# Patient Record
Sex: Male | Born: 1935
Health system: Southern US, Academic
[De-identification: ages and names within clinical notes are randomized; demographics above are authoritative.]

## PROBLEM LIST (undated history)

## (undated) ENCOUNTER — Ambulatory Visit: Payer: MEDICARE

## (undated) ENCOUNTER — Telehealth

## (undated) ENCOUNTER — Telehealth: Attending: Medical Oncology | Primary: Medical Oncology

## (undated) ENCOUNTER — Encounter

## (undated) ENCOUNTER — Ambulatory Visit

## (undated) ENCOUNTER — Ambulatory Visit: Attending: Medical Oncology | Primary: Medical Oncology

## (undated) ENCOUNTER — Encounter: Attending: Family | Primary: Family

## (undated) ENCOUNTER — Encounter: Attending: Pediatrics | Primary: Pediatrics

## (undated) ENCOUNTER — Encounter: Attending: Medical Oncology | Primary: Medical Oncology

## (undated) ENCOUNTER — Inpatient Hospital Stay

## (undated) ENCOUNTER — Ambulatory Visit: Payer: MEDICARE | Attending: "Endocrinology | Primary: "Endocrinology

## (undated) ENCOUNTER — Ambulatory Visit: Payer: MEDICARE | Attending: Medical Oncology | Primary: Medical Oncology

## (undated) ENCOUNTER — Encounter: Attending: Nurse Practitioner | Primary: Nurse Practitioner

## (undated) ENCOUNTER — Ambulatory Visit: Payer: MEDICARE | Attending: Family | Primary: Family

## (undated) ENCOUNTER — Telehealth: Attending: Family | Primary: Family

## (undated) ENCOUNTER — Encounter
Attending: Pharmacist Clinician (PhC)/ Clinical Pharmacy Specialist | Primary: Pharmacist Clinician (PhC)/ Clinical Pharmacy Specialist

## (undated) ENCOUNTER — Telehealth: Attending: Pediatrics | Primary: Pediatrics

## (undated) ENCOUNTER — Ambulatory Visit: Attending: Family Medicine | Primary: Family Medicine

## (undated) ENCOUNTER — Telehealth
Attending: Student in an Organized Health Care Education/Training Program | Primary: Student in an Organized Health Care Education/Training Program

## (undated) ENCOUNTER — Ambulatory Visit
Attending: Student in an Organized Health Care Education/Training Program | Primary: Student in an Organized Health Care Education/Training Program

## (undated) ENCOUNTER — Ambulatory Visit: Attending: Family | Primary: Family

## (undated) ENCOUNTER — Ambulatory Visit
Attending: Pharmacist Clinician (PhC)/ Clinical Pharmacy Specialist | Primary: Pharmacist Clinician (PhC)/ Clinical Pharmacy Specialist

## (undated) ENCOUNTER — Ambulatory Visit: Payer: MEDICARE | Attending: Children | Primary: Children

## (undated) DIAGNOSIS — E079 Disorder of thyroid, unspecified: Secondary | ICD-10-CM

## (undated) DIAGNOSIS — C61 Malignant neoplasm of prostate: Secondary | ICD-10-CM

## (undated) HISTORY — DX: Disorder of thyroid, unspecified: E07.9

## (undated) HISTORY — DX: Malignant neoplasm of prostate: C61

## (undated) MED ORDER — ALENDRONATE 70 MG TABLET: 70 mg | tablet | 0 refills | 357 days

## (undated) MED ORDER — LEUPROLIDE 22.5 MG (3 MONTH) INTRAMUSCULAR SYRINGE KIT: INTRAMUSCULAR | 0 days

## (undated) MED ORDER — CIPROFLOXACIN 500 MG TABLET: Freq: Two times a day (BID) | ORAL | 0 days | Status: SS

---

## 2003-09-23 HISTORY — PX: COLONOSCOPY: SHX174

## 2009-06-28 ENCOUNTER — Ambulatory Visit: Payer: Self-pay | Admitting: Urology

## 2009-07-04 ENCOUNTER — Ambulatory Visit: Payer: Self-pay | Admitting: Urology

## 2009-09-22 HISTORY — PX: APPENDECTOMY: SHX54

## 2009-10-23 ENCOUNTER — Ambulatory Visit: Payer: Self-pay | Admitting: Oncology

## 2009-10-24 ENCOUNTER — Ambulatory Visit: Payer: Self-pay | Admitting: Radiation Oncology

## 2009-11-20 ENCOUNTER — Ambulatory Visit: Payer: Self-pay | Admitting: Oncology

## 2009-11-20 ENCOUNTER — Ambulatory Visit: Payer: Self-pay | Admitting: Radiation Oncology

## 2009-12-21 ENCOUNTER — Ambulatory Visit: Payer: Self-pay | Admitting: Radiation Oncology

## 2009-12-21 ENCOUNTER — Ambulatory Visit: Payer: Self-pay | Admitting: Oncology

## 2010-01-20 ENCOUNTER — Ambulatory Visit: Payer: Self-pay | Admitting: Oncology

## 2010-01-20 ENCOUNTER — Ambulatory Visit: Payer: Self-pay | Admitting: Radiation Oncology

## 2010-02-20 ENCOUNTER — Ambulatory Visit: Payer: Self-pay | Admitting: Radiation Oncology

## 2010-03-22 ENCOUNTER — Ambulatory Visit: Payer: Self-pay | Admitting: Radiation Oncology

## 2010-04-29 ENCOUNTER — Ambulatory Visit: Payer: Self-pay | Admitting: Internal Medicine

## 2012-08-27 DIAGNOSIS — C61 Malignant neoplasm of prostate: Secondary | ICD-10-CM | POA: Insufficient documentation

## 2012-08-27 DIAGNOSIS — N401 Enlarged prostate with lower urinary tract symptoms: Secondary | ICD-10-CM | POA: Insufficient documentation

## 2012-11-10 ENCOUNTER — Ambulatory Visit: Payer: Self-pay | Admitting: Urology

## 2012-11-11 LAB — URINE CULTURE

## 2012-11-17 ENCOUNTER — Ambulatory Visit: Payer: Self-pay | Admitting: Urology

## 2012-11-29 LAB — PATHOLOGY REPORT

## 2013-10-11 ENCOUNTER — Ambulatory Visit: Payer: Self-pay | Admitting: Urology

## 2013-12-06 ENCOUNTER — Ambulatory Visit: Payer: Self-pay | Admitting: Radiation Oncology

## 2013-12-09 ENCOUNTER — Ambulatory Visit: Payer: Self-pay | Admitting: Radiation Oncology

## 2013-12-21 ENCOUNTER — Ambulatory Visit: Payer: Self-pay | Admitting: Radiation Oncology

## 2013-12-21 LAB — HEPATIC FUNCTION PANEL A (ARMC)
ALK PHOS: 63 U/L
Albumin: 3.7 g/dL (ref 3.4–5.0)
Bilirubin, Direct: 0.1 mg/dL (ref 0.00–0.20)
Bilirubin,Total: 0.3 mg/dL (ref 0.2–1.0)
SGOT(AST): 15 U/L (ref 15–37)
SGPT (ALT): 22 U/L (ref 12–78)
Total Protein: 6.9 g/dL (ref 6.4–8.2)

## 2013-12-21 LAB — CBC CANCER CENTER
Basophil #: 0.1 x10 3/mm (ref 0.0–0.1)
Basophil %: 1 %
Eosinophil #: 0.1 x10 3/mm (ref 0.0–0.7)
Eosinophil %: 2 %
HCT: 45.8 % (ref 40.0–52.0)
HGB: 15.2 g/dL (ref 13.0–18.0)
Lymphocyte #: 2.6 x10 3/mm (ref 1.0–3.6)
Lymphocyte %: 36.8 %
MCH: 32.4 pg (ref 26.0–34.0)
MCHC: 33.2 g/dL (ref 32.0–36.0)
MCV: 98 fL (ref 80–100)
Monocyte #: 0.5 x10 3/mm (ref 0.2–1.0)
Monocyte %: 7.5 %
NEUTROS ABS: 3.7 x10 3/mm (ref 1.4–6.5)
NEUTROS PCT: 52.7 %
PLATELETS: 287 x10 3/mm (ref 150–440)
RBC: 4.68 10*6/uL (ref 4.40–5.90)
RDW: 14.1 % (ref 11.5–14.5)
WBC: 7 x10 3/mm (ref 3.8–10.6)

## 2013-12-21 LAB — CREATININE, SERUM
Creatinine: 1.2 mg/dL (ref 0.60–1.30)
EGFR (African American): >60
EGFR (Non-African Amer.): 58 — ABNORMAL LOW

## 2014-01-19 ENCOUNTER — Ambulatory Visit: Payer: Self-pay

## 2014-01-20 ENCOUNTER — Ambulatory Visit: Payer: Self-pay | Admitting: Radiation Oncology

## 2014-06-15 ENCOUNTER — Ambulatory Visit: Payer: Self-pay | Admitting: Internal Medicine

## 2014-06-22 ENCOUNTER — Ambulatory Visit: Payer: Self-pay | Admitting: Internal Medicine

## 2014-12-13 ENCOUNTER — Ambulatory Visit
Admit: 2014-12-13 | Disposition: A | Discharge status: Home / Self Care | Payer: Self-pay | Attending: Internal Medicine | Admitting: Internal Medicine

## 2015-01-12 NOTE — Op Note (Signed)
PATIENT NAME:  Kevin Donovan, Kevin Donovan MR#:  [id number] DATE OF BIRTH:  04/09/1936  DATE OF PROCEDURE:  11/17/2012  PREOPERATIVE DIAGNOSES: Prostate cancer with a rising PSA.   POSTOPERATIVE DIAGNOSIS: Prostate cancer with a rising PSA.    PROCEDURES: 1.  Transperineal prostate biopsy (saturation).  2.  Transrectal ultrasound of the prostate.  3.  Ultrasound guidance for prostate biopsy.   SURGEON: Fed Giovanni, M.D.   ASSISTANT: None.   ANESTHESIA: General.   INDICATION: This is a 79 year old male with a history of T1C adenocarcinoma of the prostate with a Gleason score of 3 + 3, 3 + 4 and 4 + 3. He received adjuvant hormonal therapy and  IMRT. Prostate volume at the time of biopsy was 260 mL. He had slowly rising PSA and repeat biopsy performed on April 2013. It was remarkable for a PSA of 2.2 with prostate volume of approximately 200 mL. Biopsies showed radiation change but no cancer. He was started on Avodart. PSA in November 2013 was 4.1 and in December was 8.1. He presents for saturation biopsies.   DESCRIPTION OF PROCEDURE: The patient was taken to the operating room where general anesthetic was administered. He was placed in the low lithotomy position and his external genitalia and perineum were prepped and draped in the usual fashion. Timeout was performed. A transrectal ultrasound probe was lubricated and passed per rectum. The probe was placed into the stepping unit and the prostatic image was positioned on the ultrasound monitor. A perineal template was then placed into the stepping unit. Transperineal needle biopsies are then performed under ultrasound guidance. A total of 34 biopsies were taken including anterior prostate tissue. No significant bleeding was noted. Prostate volume was calculated at 150 mL. Ultrasound probe was removed. He was taken to PAC-U in stable condition. There were no complications.   ESTIMATED BLOOD LOSS:  Minimal.     ____________________________ Ronda Fairly.  Bernardo Heater, MD scs:cc D: 11/17/2012 15:22:38 ET T: 11/17/2012 15:33:09 ET JOB#: [id number]  cc: Nicki Reaper C. Bernardo Heater, MD, <Dictator> Abbie Sons MD ELECTRONICALLY SIGNED 12/01/2012 7:44

## 2015-01-13 NOTE — Consult Note (Signed)
Reason for Visit: This 79 year old Male patient presents to the clinic for initial evaluation of  recurrent prostate cancer .   Referred by self-referral.  Diagnosis:  Chief Complaint/Diagnosis   35 sutural male treated back in 2001 with IM RT radiation therapy for Gleason 7 (4+3) adenocarcinoma prostate and now with pelvic lymph node recurrence on Lupron therapy  Imaging Report MRI scan reviewed   Referral Report clinical notes reviewed   Planned Treatment Regimen referral to medical oncology plus bone scan   HPI   patient is a 78 rolled male well known to our department have been treated back in August of 2011 4 adenocarcinoma prostate mostly confined to the left lateral lobe Gleason score of 7 (4+3) N. Gleason 6 (3+3). He initially was treated with Lupron. Bone scan initially was worrisome for metastatic disease a low MRI of the spine showed no evidence to suggest bony involvement. MRI of the pelvis at presentation also showed no evidence to suggest extracapsular spread or pelvic lymph nodes. In April 2013 his PSA climbed to 2.2 he's been followed by Dr.Stioff. His PSA in January 2015 had climbed to 13.3 prostate MRI showed lymphadenopathy up to 3 cm and left obturator iliac and periaortic lymph nodes consistent with metastatic disease. He's had saturation repeat biopsy of his prostate which were all benign. He is fairly asymptomatic except for occasional urge incontinencedoes have nocturia times x3 and some urgency and frequency. No diarrhea. He is seen today on his own self referral for consideration of treatment recommendations. He has not had a bone scan performed.atient is having no bone pain at this time.  Past Hx:    Prostate Cancer:    denies:    Hernia Repair:   Past, Family and Social History:  Past Medical History positive   Genitourinary BPH, recurrent UTIs   Past Surgical History herniorrhaphy repair   Family History noncontributory   Social History positive    Social History Comments greater than 40-pack-year smoking history no EtOH abuse history   Additional Past Medical and Surgical History accompanied by his wife today   Allergies:   Valium: Other  Home Meds:  Home Medications: Medication Instructions Status  Avodart 0.5 mg oral capsule 1 cap(s) orally once a day  (Mon. Wed. Fri) Active  Centrum Silver Men's Therapeutic Multiple Vitamins with Minerals oral tablet 1 tab(s) orally once a day Active  Bayer Low Dose 81 mg oral delayed release tablet 1 tab(s) orally once a day Active   Review of Systems:  General negative   Performance Status (ECOG) 0   Skin negative   Breast negative   Ophthalmologic negative   Respiratory and Thorax negative   Cardiovascular negative   Gastrointestinal negative   Genitourinary see HPI   Musculoskeletal negative   Neurological negative   Psychiatric negative   Hematology/Lymphatics negative   Endocrine negative   Allergic/Immunologic negative   Review of Systems   review of systems obtained from nurses notes  Nursing Notes:  Nursing Vital Signs and Chemo Nursing Nursing Notes: *CC Vital Signs Flowsheet:   20-Mar-15 11:07  Pulse Pulse 60  Respirations Respirations 20  SBP SBP 176  DBP DBP 76  Pain Scale (0-10)  0  Current Weight (kg) (kg) 86.7   Physical Exam:  General/Skin/HEENT:  General normal   Skin normal   Eyes normal   ENMT normal   Head and Neck normal   Additional PE well-developed male in NAD. Lungs are clear to A&P cardiac examination  shows regular rate and rhythm. On rectal exam rectal sphincter tone is good prostate is markedly reduced in size without evidence of nodularity or mass. No other rectal abnormality is identified.   Breasts/Resp/CV/GI/GU:  Respiratory and Thorax normal   Cardiovascular normal   Gastrointestinal normal   Genitourinary normal   MS/Neuro/Psych/Lymph:  Musculoskeletal normal   Neurological normal   Lymphatics  normal   Other Results:  Radiology Results: LabUnknown:    20-Jan-15 15:14, MRI Prostate WWO (iCAD)  PACS Image    Relevent Results:   Relevant Scans and Labs MRI scan reviewed   Assessment and Plan: Impression:   recurrent prostate cancer in 79 year old male status post IM RT radiation therapy 4 years prior Plan:   at this time I believe the patient does have metastatic disease to his pelvic lymph nodes with rising PSA and MRI evidence of lymphadenopathy. I've ordered a bone scan for baseline study at this time. Also a referral to medical oncology service in the future he may need chemotherapy treatment depending on the acceleration of his PSA and overall course of his disease. I discussed the case withDr Ma Hillock and have made a referral. I have asked to see him back in 6 months for followup. We have to reevaluate at any time should palliative treatment indicated. See no role at this time for radiation therapy with para-aortic and metastatic pelvic lymphadenopathy. I also agree with the current management by Dr.Stioff ith long-term Lupron suppression.  CC Referral:  cc: Dr. Dorian Pod, Dr. Elenor Quinones   Electronic Signatures: Baruch Gouty, Roda Shutters (MD)  (Signed 20-Mar-15 11:54)  Authored: HPI, Diagnosis, Past Hx, PFSH, Allergies, Home Meds, ROS, Nursing Notes, Physical Exam, Other Results, Relevent Results, Encounter Assessment and Plan, CC Referring Physician   Last Updated: 20-Mar-15 11:54 by Armstead Peaks (MD)

## 2015-02-26 ENCOUNTER — Telehealth: Payer: Self-pay | Admitting: Emergency Medicine

## 2015-02-26 MED ORDER — VALACYCLOVIR HCL 1 G PO TABS: 1000.0000 mg | ORAL_TABLET | Freq: Two times a day (BID) | ORAL | Status: DC

## 2015-02-26 NOTE — Addendum Note (Signed)
Addended by: Ashok Norris on: 02/26/2015 01:57 PM   Modules accepted: Orders

## 2015-02-26 NOTE — Telephone Encounter (Signed)
Patient wife called and stated patient has shingles on chest and arms. Can something be called in.  [facility]

## 2015-11-05 DIAGNOSIS — C61 Malignant neoplasm of prostate: Secondary | ICD-10-CM | POA: Diagnosis not present

## 2015-11-22 DIAGNOSIS — R69 Illness, unspecified: Secondary | ICD-10-CM | POA: Diagnosis not present

## 2015-11-22 DIAGNOSIS — R9721 Rising PSA following treatment for malignant neoplasm of prostate: Secondary | ICD-10-CM | POA: Diagnosis not present

## 2015-11-22 DIAGNOSIS — Z79818 Long term (current) use of other agents affecting estrogen receptors and estrogen levels: Secondary | ICD-10-CM | POA: Diagnosis not present

## 2015-11-22 DIAGNOSIS — I10 Essential (primary) hypertension: Secondary | ICD-10-CM | POA: Diagnosis not present

## 2015-11-22 DIAGNOSIS — Z79899 Other long term (current) drug therapy: Secondary | ICD-10-CM | POA: Diagnosis not present

## 2015-11-22 DIAGNOSIS — C61 Malignant neoplasm of prostate: Secondary | ICD-10-CM | POA: Diagnosis not present

## 2016-01-18 DIAGNOSIS — R590 Localized enlarged lymph nodes: Secondary | ICD-10-CM | POA: Diagnosis not present

## 2016-01-18 DIAGNOSIS — C61 Malignant neoplasm of prostate: Secondary | ICD-10-CM | POA: Diagnosis not present

## 2016-01-18 DIAGNOSIS — N281 Cyst of kidney, acquired: Secondary | ICD-10-CM | POA: Diagnosis not present

## 2016-01-24 DIAGNOSIS — C61 Malignant neoplasm of prostate: Secondary | ICD-10-CM | POA: Diagnosis not present

## 2016-01-24 DIAGNOSIS — R9721 Rising PSA following treatment for malignant neoplasm of prostate: Secondary | ICD-10-CM | POA: Diagnosis not present

## 2016-01-24 DIAGNOSIS — Z79818 Long term (current) use of other agents affecting estrogen receptors and estrogen levels: Secondary | ICD-10-CM | POA: Diagnosis not present

## 2016-01-24 DIAGNOSIS — C779 Secondary and unspecified malignant neoplasm of lymph node, unspecified: Secondary | ICD-10-CM | POA: Diagnosis not present

## 2016-05-08 DIAGNOSIS — C7982 Secondary malignant neoplasm of genital organs: Secondary | ICD-10-CM | POA: Diagnosis not present

## 2016-05-08 DIAGNOSIS — Z79818 Long term (current) use of other agents affecting estrogen receptors and estrogen levels: Secondary | ICD-10-CM | POA: Diagnosis not present

## 2016-05-08 DIAGNOSIS — Z79899 Other long term (current) drug therapy: Secondary | ICD-10-CM | POA: Diagnosis not present

## 2016-05-08 DIAGNOSIS — Z7982 Long term (current) use of aspirin: Secondary | ICD-10-CM | POA: Diagnosis not present

## 2016-05-08 DIAGNOSIS — C61 Malignant neoplasm of prostate: Secondary | ICD-10-CM | POA: Diagnosis not present

## 2016-07-28 DIAGNOSIS — C61 Malignant neoplasm of prostate: Secondary | ICD-10-CM | POA: Diagnosis not present

## 2016-08-21 DIAGNOSIS — R9721 Rising PSA following treatment for malignant neoplasm of prostate: Secondary | ICD-10-CM | POA: Diagnosis not present

## 2016-08-21 DIAGNOSIS — R351 Nocturia: Secondary | ICD-10-CM | POA: Diagnosis not present

## 2016-08-21 DIAGNOSIS — C61 Malignant neoplasm of prostate: Secondary | ICD-10-CM | POA: Diagnosis not present

## 2016-08-21 DIAGNOSIS — Z7982 Long term (current) use of aspirin: Secondary | ICD-10-CM | POA: Diagnosis not present

## 2016-08-21 DIAGNOSIS — I10 Essential (primary) hypertension: Secondary | ICD-10-CM | POA: Diagnosis not present

## 2016-10-16 DIAGNOSIS — R590 Localized enlarged lymph nodes: Secondary | ICD-10-CM | POA: Diagnosis not present

## 2016-10-16 DIAGNOSIS — C61 Malignant neoplasm of prostate: Secondary | ICD-10-CM | POA: Diagnosis not present

## 2016-10-23 DIAGNOSIS — C775 Secondary and unspecified malignant neoplasm of intrapelvic lymph nodes: Secondary | ICD-10-CM

## 2016-10-23 DIAGNOSIS — Z7982 Long term (current) use of aspirin: Secondary | ICD-10-CM | POA: Diagnosis not present

## 2016-10-23 DIAGNOSIS — C61 Malignant neoplasm of prostate: Secondary | ICD-10-CM | POA: Insufficient documentation

## 2016-10-31 ENCOUNTER — Ambulatory Visit (INDEPENDENT_AMBULATORY_CARE_PROVIDER_SITE_OTHER): Payer: Medicare HMO | Admitting: Family Medicine

## 2016-10-31 VITALS — BP 134/58 | HR 74 | Temp 99.0°F | Resp 16 | Ht 70.0 in | Wt 194.2 lb

## 2016-10-31 DIAGNOSIS — M5136 Other intervertebral disc degeneration, lumbar region: Secondary | ICD-10-CM | POA: Insufficient documentation

## 2016-10-31 DIAGNOSIS — F4321 Adjustment disorder with depressed mood: Secondary | ICD-10-CM

## 2016-10-31 DIAGNOSIS — Z23 Encounter for immunization: Secondary | ICD-10-CM

## 2016-10-31 DIAGNOSIS — Z720 Tobacco use: Secondary | ICD-10-CM

## 2016-10-31 DIAGNOSIS — I700 Atherosclerosis of aorta: Secondary | ICD-10-CM

## 2016-10-31 DIAGNOSIS — R69 Illness, unspecified: Secondary | ICD-10-CM | POA: Diagnosis not present

## 2016-10-31 DIAGNOSIS — I10 Essential (primary) hypertension: Secondary | ICD-10-CM | POA: Diagnosis not present

## 2016-10-31 DIAGNOSIS — R030 Elevated blood-pressure reading, without diagnosis of hypertension: Secondary | ICD-10-CM | POA: Insufficient documentation

## 2016-10-31 MED ORDER — LISINOPRIL 20 MG PO TABS: 20.0000 mg | ORAL_TABLET | Freq: Every day | ORAL | 0 refills | Status: DC

## 2016-10-31 NOTE — Progress Notes (Signed)
Name: Kevin Donovan   MRN: [id number]    DOB: [birth year]   Date:10/31/2016       Progress Note  Subjective  Chief Complaint  Chief Complaint  Patient presents with  . blood pressure    HPI  Elevated blood pressure reading: he is going to start chemotherapy and hematologist states that bp is gon to go up by 10-20 points once he takes the medication, he has been anxious because of the diagnoses of metastatic cancer. BP was over 170 during recent visit to [place]: he was diagnosed in 2008 but this month diagnosed with metastatic cancer  Calcification abdominal aorta: found on CT abdomen, he has metastatic prostate cancer, continue aspirin and we will hold off on statin therapy   Patient Active Problem List   Diagnosis Date Noted  . Elevated blood pressure reading 10/31/2016  . Situational depression 10/31/2016  . Calcification of abdominal aorta (HCC) 10/31/2016  . Degenerative disc disease, lumbar 10/31/2016  . Tobacco abuse 10/31/2016  . Prostate cancer metastatic to intrapelvic lymph node ([place]) 10/23/2016  . Malignant neoplasm of prostate ([place]) 08/27/2012  . Enlarged prostate with lower urinary tract symptoms (LUTS) 08/27/2012    Past Surgical History:  Procedure Laterality Date  . APPENDECTOMY  2011  . COLONOSCOPY  2005    Family History  Problem Relation Age of Onset  . Lupus Mother   . Heart disease Mother     CHF  . Heart disease Father     Social History   Social History  . Marital status: Married    Spouse name: N/A  . Number of children: N/A  . Years of education: N/A   Occupational History  . Not on file.   Social History Main Topics  . Smoking status: Current Every Day Smoker    Packs/day: 0.50    Years: 72.00    Types: Cigarettes  . Smokeless tobacco: Never Used  . Alcohol use No  . Drug use: No  . Sexual activity: Yes   Other Topics Concern  . Not on file   Social History Narrative  . No narrative on file     Current  Outpatient Prescriptions:  .  aspirin EC 81 MG tablet, Take 81 mg by mouth., Disp: , Rfl:  .  escitalopram (LEXAPRO) 10 MG tablet, Take 10 mg by mouth., Disp: , Rfl:  .  vitamin C (ASCORBIC ACID) 250 MG tablet, Take 250 mg by mouth daily., Disp: , Rfl:  .  leuprolide (LUPRON) 22.5 MG injection, Inject 22.5 mg into the muscle., Disp: , Rfl:  .  lisinopril (PRINIVIL,ZESTRIL) 20 MG tablet, Take 1 tablet (20 mg total) by mouth daily., Disp: 30 tablet, Rfl: 0 .  Multiple Vitamin (MULTI-VITAMINS) TABS, Take by mouth., Disp: , Rfl:   Allergies  Allergen Reactions  . Diazepam Other (See Comments)    Hallucinations, altered mental status     ROS  Ten systems reviewed and is negative except as mentioned in HPI   Objective  Vitals:   10/31/16 1535  BP: (!) 134/58  Pulse: 74  Resp: 16  Temp: 99 F (37.2 C)  SpO2: 95%  Weight: 194 lb 3 oz (88.1 kg)  Height: 5\' 10"  (1.778 m)    Body mass index is 27.86 kg/m.  Physical Exam  Constitutional: Patient appears well-developed and well-nourished.  No distress.  HEENT: head atraumatic, normocephalic, pupils equal and reactive to light, neck supple, throat within normal limits Cardiovascular: Normal rate, regular  rhythm and normal heart sounds.  No murmur heard. No BLE edema. Pulmonary/Chest: Effort normal and breath sounds normal. No respiratory distress. Abdominal: Soft.  There is no tenderness. Psychiatric: Patient has a normal mood and affect. behavior is normal. Judgment and thought content normal.   PHQ2/9: Depression screen PHQ 2/9 10/31/2016  Decreased Interest 0  Down, Depressed, Hopeless 0  PHQ - 2 Score 0     Fall Risk: Fall Risk  10/31/2016  Falls in the past year? No    Functional Status Survey: Is the patient deaf or have difficulty hearing?: No Does the patient have difficulty seeing, even when wearing glasses/contacts?: No Does the patient have difficulty concentrating, remembering, or making decisions?: No Does  the patient have difficulty walking or climbing stairs?: No Does the patient have difficulty dressing or bathing?: No Does the patient have difficulty doing errands alone such as visiting a doctor's office or shopping?: No    Assessment & Plan  1. Essential hypertension  - lisinopril (PRINIVIL,ZESTRIL) 20 MG tablet; Take 1 tablet (20 mg total) by mouth daily.  Dispense: 30 tablet; Refill: 0  2. Need for pneumococcal vaccination  - Pneumococcal conjugate vaccine 13-valent  3. Situational depression  Taking medication  4. Calcification of abdominal aorta (HCC)  We will hold off on statin at this time, he is taking aspirin   5. Tobacco abuse  Started smoking at age 35 yo, he denies cough or SOB

## 2016-11-03 ENCOUNTER — Telehealth: Payer: Self-pay | Admitting: Emergency Medicine

## 2016-11-03 DIAGNOSIS — C61 Malignant neoplasm of prostate: Secondary | ICD-10-CM | POA: Diagnosis not present

## 2016-11-03 NOTE — Telephone Encounter (Signed)
Was seen  At [facility] today. BP was elevated at 197/86 when arriving and on recheck in was 166/90. Do they need to increase BP medication.

## 2016-11-03 NOTE — Telephone Encounter (Signed)
How is his bp at home, we can add norvasc 5 mg to bring it down, but it may cause him to get dizzy if bp is low at home

## 2016-11-04 ENCOUNTER — Other Ambulatory Visit: Payer: Self-pay | Admitting: Emergency Medicine

## 2016-11-04 MED ORDER — AMLODIPINE BESYLATE 5 MG PO TABS: 5.0000 mg | ORAL_TABLET | Freq: Every day | ORAL | 1 refills | Status: DC

## 2016-11-04 NOTE — Progress Notes (Unsigned)
norvasc 

## 2016-11-04 NOTE — Telephone Encounter (Signed)
Would like script for 5mg  Norvasc called into [facility]. BP today 160/80.

## 2016-11-05 ENCOUNTER — Other Ambulatory Visit: Payer: Self-pay | Admitting: Family Medicine

## 2016-11-14 ENCOUNTER — Encounter: Payer: Self-pay | Admitting: Family Medicine

## 2016-11-14 DIAGNOSIS — R001 Bradycardia, unspecified: Secondary | ICD-10-CM | POA: Insufficient documentation

## 2016-11-20 DIAGNOSIS — R351 Nocturia: Secondary | ICD-10-CM | POA: Diagnosis not present

## 2016-11-20 DIAGNOSIS — Z7982 Long term (current) use of aspirin: Secondary | ICD-10-CM | POA: Diagnosis not present

## 2016-11-20 DIAGNOSIS — C61 Malignant neoplasm of prostate: Secondary | ICD-10-CM | POA: Diagnosis not present

## 2016-11-20 DIAGNOSIS — Z79818 Long term (current) use of other agents affecting estrogen receptors and estrogen levels: Secondary | ICD-10-CM | POA: Diagnosis not present

## 2016-11-20 DIAGNOSIS — Z888 Allergy status to other drugs, medicaments and biological substances status: Secondary | ICD-10-CM | POA: Diagnosis not present

## 2016-11-20 DIAGNOSIS — R69 Illness, unspecified: Secondary | ICD-10-CM | POA: Diagnosis not present

## 2016-11-20 DIAGNOSIS — C775 Secondary and unspecified malignant neoplasm of intrapelvic lymph nodes: Secondary | ICD-10-CM | POA: Diagnosis not present

## 2016-11-20 DIAGNOSIS — F419 Anxiety disorder, unspecified: Secondary | ICD-10-CM | POA: Diagnosis not present

## 2016-11-20 DIAGNOSIS — Z923 Personal history of irradiation: Secondary | ICD-10-CM | POA: Diagnosis not present

## 2016-11-20 DIAGNOSIS — Z192 Hormone resistant malignancy status: Secondary | ICD-10-CM | POA: Diagnosis not present

## 2016-11-24 ENCOUNTER — Telehealth: Payer: Self-pay | Admitting: Emergency Medicine

## 2016-11-24 NOTE — Telephone Encounter (Signed)
Sarah from [facility]  Nurse navigator for Dr. Arlyss Gandy Oncologist [phone] called and stated that Kevin Donovan came in for the pancreatic cancer study and BP was elevated at 150/68. Judson Roch stated that they needed it to be lower than that. Spoke to his wife [name] she stated BP yesterday was 138/60. His follow up appointment with you is 11/28/2016.

## 2016-11-28 ENCOUNTER — Ambulatory Visit (INDEPENDENT_AMBULATORY_CARE_PROVIDER_SITE_OTHER): Payer: Medicare HMO | Admitting: Family Medicine

## 2016-11-28 ENCOUNTER — Encounter: Payer: Self-pay | Admitting: Family Medicine

## 2016-11-28 VITALS — BP 132/84 | HR 66 | Temp 97.6°F | Resp 18 | Ht 70.0 in | Wt 196.0 lb

## 2016-11-28 DIAGNOSIS — C775 Secondary and unspecified malignant neoplasm of intrapelvic lymph nodes: Secondary | ICD-10-CM

## 2016-11-28 DIAGNOSIS — I440 Atrioventricular block, first degree: Secondary | ICD-10-CM | POA: Diagnosis not present

## 2016-11-28 DIAGNOSIS — R69 Illness, unspecified: Secondary | ICD-10-CM | POA: Diagnosis not present

## 2016-11-28 DIAGNOSIS — C61 Malignant neoplasm of prostate: Secondary | ICD-10-CM

## 2016-11-28 DIAGNOSIS — F4321 Adjustment disorder with depressed mood: Secondary | ICD-10-CM

## 2016-11-28 DIAGNOSIS — I10 Essential (primary) hypertension: Secondary | ICD-10-CM

## 2016-11-28 MED ORDER — AMLODIPINE BESY-BENAZEPRIL HCL 5-20 MG PO CAPS: 1.0000 | ORAL_CAPSULE | Freq: Every day | ORAL | 1 refills | Status: DC

## 2016-11-28 MED ORDER — ESCITALOPRAM OXALATE 10 MG PO TABS: 10.0000 mg | ORAL_TABLET | Freq: Every day | ORAL | 1 refills | Status: DC

## 2016-11-28 NOTE — Progress Notes (Signed)
Name: Kevin Donovan   MRN: [id number]    DOB: 15-Mar-1936   Date:11/28/2016       Progress Note  Subjective  Chief Complaint  Chief Complaint  Patient presents with  . Follow-up    1 month F/U  . Hypertension    Started Lisinopril on last visit and feels tired all the time.  BP has been the same at home around 130's/70's    HPI  HTN: he was going to start a research medication for prostate cancer and bp was elevated during [facility] visit, so he could not start therapy, bp at home has been well controlled with Lotrel 130's/70's. No chest pain or palpitation.   Prostate  Cancer: he was diagnosed in 2008 but this month diagnosed with metastatic cancer, he decided not to pursue the research arm of therapy because it will increase his bp, but will start therapy soon .   Calcification abdominal aorta: found on CT abdomen, he has metastatic prostate cancer, continue aspirin , he does not want to take statins at this time  Depression: he was very snappy, bp was going up, we started Lexapro January and is doing well, mood has been more leveled off, wife is happy about it. He denies crying spells.   Bradycardia on EKG: done at [facility], but no change since 2014.   Patient Active Problem List   Diagnosis Date Noted  . Bradycardia on ECG 11/14/2016  . Elevated blood pressure reading 10/31/2016  . Situational depression 10/31/2016  . Calcification of abdominal aorta (HCC) 10/31/2016  . Degenerative disc disease, lumbar 10/31/2016  . Prostate cancer metastatic to intrapelvic lymph node ([place]) 10/23/2016  . Malignant neoplasm of prostate ([place]) 08/27/2012  . Enlarged prostate with lower urinary tract symptoms (LUTS) 08/27/2012    Past Surgical History:  Procedure Laterality Date  . APPENDECTOMY  2011  . COLONOSCOPY  2005    Family History  Problem Relation Age of Onset  . Lupus Mother   . Heart disease Mother     CHF  . Heart disease Father     Social History   Social History  . Marital  status: Married    Spouse name: N/A  . Number of children: N/A  . Years of education: N/A   Occupational History  . Not on file.   Social History Main Topics  . Smoking status: Current Every Day Smoker    Packs/day: 0.50    Years: 72.00    Types: Cigarettes  . Smokeless tobacco: Never Used  . Alcohol use No  . Drug use: No  . Sexual activity: Yes   Other Topics Concern  . Not on file   Social History Narrative  . No narrative on file     Current Outpatient Prescriptions:  .  aspirin EC 81 MG tablet, Take 81 mg by mouth., Disp: , Rfl:  .  escitalopram (LEXAPRO) 10 MG tablet, Take 1 tablet (10 mg total) by mouth daily., Disp: 30 tablet, Rfl: 1 .  leuprolide (LUPRON) 22.5 MG injection, Inject 22.5 mg into the muscle., Disp: , Rfl:  .  Multiple Vitamin (MULTI-VITAMINS) TABS, Take by mouth., Disp: , Rfl:  .  vitamin C (ASCORBIC ACID) 250 MG tablet, Take 250 mg by mouth daily., Disp: , Rfl:  .  amLODipine-benazepril (LOTREL) 5-20 MG capsule, Take 1 capsule by mouth daily., Disp: 30 capsule, Rfl: 1  Allergies  Allergen Reactions  . Diazepam Other (See Comments)    Hallucinations, altered mental status  ROS  Constitutional: Negative for fever or weight change.  Respiratory: Negative for cough and shortness of breath.   Cardiovascular: Negative for chest pain or palpitations.  Gastrointestinal: Negative for abdominal pain, no bowel changes.  Musculoskeletal: Negative for gait problem or joint swelling.  Skin: Negative for rash.  Neurological: Negative for dizziness or headache.  No other specific complaints in a complete review of systems (except as listed in HPI above).  Objective  Vitals:   11/28/16 1045  BP: 132/84  Pulse: 66  Resp: 18  Temp: 97.6 F (36.4 C)  TempSrc: Oral  SpO2: 94%  Weight: 196 lb (88.9 kg)  Height: 5\' 10"  (1.778 m)    Body mass index is 28.12 kg/m.  Physical Exam  Constitutional: Patient appears well-developed and  well-nourished.  No distress.  HEENT: head atraumatic, normocephalic, pupils equal and reactive to light, ears neck supple, throat within normal limits Cardiovascular: Normal rate, regular rhythm and normal heart sounds.  No murmur heard. No BLE edema. Pulmonary/Chest: Effort normal and breath sounds normal. No respiratory distress. Abdominal: Soft.  There is no tenderness. Psychiatric: Patient has a normal mood and affect. behavior is normal. Judgment and thought content normal.  PHQ2/9: Depression screen PHQ 2/9 10/31/2016  Decreased Interest 0  Down, Depressed, Hopeless 0  PHQ - 2 Score 0     Fall Risk: Fall Risk  10/31/2016  Falls in the past year? No     Assessment & Plan  1. Essential hypertension  bp here is at goal, up at [facility], decided not to be part of the prostate cancer study - amLODipine-benazepril (LOTREL) 5-20 MG capsule; Take 1 capsule by mouth daily.  Dispense: 30 capsule; Refill: 1  2. Situational depression  Doing better, less snappy  - escitalopram (LEXAPRO) 10 MG tablet; Take 1 tablet (10 mg total) by mouth daily.  Dispense: 30 tablet; Refill: 1   3. Prostate cancer metastatic to intrapelvic lymph node ([place])  Continue follow up with [facility], he decided not to be part of the study   4. First degree AV block  Since 2014 , no changes on EKG done from 2014 and 2018 at [facility] - discussed starting statin therapy but he does not like taking medication, but is taking aspirin and bp is at goal

## 2016-12-25 DIAGNOSIS — C61 Malignant neoplasm of prostate: Secondary | ICD-10-CM | POA: Diagnosis not present

## 2016-12-25 DIAGNOSIS — C775 Secondary and unspecified malignant neoplasm of intrapelvic lymph nodes: Secondary | ICD-10-CM | POA: Diagnosis not present

## 2017-01-28 ENCOUNTER — Ambulatory Visit (INDEPENDENT_AMBULATORY_CARE_PROVIDER_SITE_OTHER): Payer: Medicare HMO | Admitting: Family Medicine

## 2017-01-28 ENCOUNTER — Encounter: Payer: Self-pay | Admitting: Family Medicine

## 2017-01-28 VITALS — BP 130/58 | HR 67 | Temp 97.7°F | Resp 16 | Ht 70.0 in | Wt 201.6 lb

## 2017-01-28 DIAGNOSIS — C775 Secondary and unspecified malignant neoplasm of intrapelvic lymph nodes: Secondary | ICD-10-CM

## 2017-01-28 DIAGNOSIS — C61 Malignant neoplasm of prostate: Secondary | ICD-10-CM | POA: Diagnosis not present

## 2017-01-28 DIAGNOSIS — Z720 Tobacco use: Secondary | ICD-10-CM | POA: Diagnosis not present

## 2017-01-28 DIAGNOSIS — I10 Essential (primary) hypertension: Secondary | ICD-10-CM | POA: Diagnosis not present

## 2017-01-28 DIAGNOSIS — I700 Atherosclerosis of aorta: Secondary | ICD-10-CM | POA: Diagnosis not present

## 2017-01-28 DIAGNOSIS — F4321 Adjustment disorder with depressed mood: Secondary | ICD-10-CM

## 2017-01-28 DIAGNOSIS — R69 Illness, unspecified: Secondary | ICD-10-CM | POA: Diagnosis not present

## 2017-01-28 MED ORDER — ESCITALOPRAM OXALATE 10 MG PO TABS: 10.0000 mg | ORAL_TABLET | Freq: Every day | ORAL | 1 refills | Status: DC

## 2017-01-28 MED ORDER — AMLODIPINE BESY-BENAZEPRIL HCL 5-20 MG PO CAPS: 1.0000 | ORAL_CAPSULE | Freq: Every day | ORAL | 1 refills | Status: DC

## 2017-01-28 NOTE — Progress Notes (Signed)
Name: Kevin Donovan   MRN: [id number]    DOB: [birth year]   Date:01/28/2017       Progress Note  Subjective  Chief Complaint  Chief Complaint  Patient presents with  . Hypertension    2 month follow up    HPI  HTN: he was going to start a research medication for prostate cancer and bp was elevated during [facility] visit, so he could not start therapy, but on May 5th he started a new regiment, bp at home has been well controlled with Lotrel 130's/60's No chest pain or palpitation.   Prostate Cancer: he was diagnosed in 2008 but this month diagnosed with metastatic cancer, he decided not to pursue the research arm of therapy because it will increase his bp, but is on Lupron and added prednisone and Zytiga on May 5th, 2018. So far he has been tolerating therapy well. No side effects  Calcification abdominal aorta: found on CT abdomen, he has metastatic prostate cancer, continue aspirin , he does not want to take statins at this time  Depression: he states that since he started on medication ( Lexapro ) he has not noticed any difference in his behavior, however his wife has noticed a great improvement in his mood. He does not want to increase dose at this time.   Bradycardia on EKG: done at [facility], but no change since 2014.   Tobacco: he is down to half  pack daily, instead to 1 pack. Wife quit smoking Mid April 2018. He is trying to quit also.   Patient Active Problem List   Diagnosis Date Noted  . First degree AV block 11/28/2016  . Bradycardia on ECG 11/14/2016  . Elevated blood pressure reading 10/31/2016  . Situational depression 10/31/2016  . Calcification of abdominal aorta (HCC) 10/31/2016  . Degenerative disc disease, lumbar 10/31/2016  . Prostate cancer metastatic to intrapelvic lymph node ([place]) 10/23/2016  . Malignant neoplasm of prostate ([place]) 08/27/2012  . Enlarged prostate with lower urinary tract symptoms (LUTS) 08/27/2012    Past Surgical History:  Procedure  Laterality Date  . APPENDECTOMY  2011  . COLONOSCOPY  2005    Family History  Problem Relation Age of Onset  . Lupus Mother   . Heart disease Mother     CHF  . Heart disease Father     Social History   Social History  . Marital status: Married    Spouse name: N/A  . Number of children: N/A  . Years of education: N/A   Occupational History  . Not on file.   Social History Main Topics  . Smoking status: Current Every Day Smoker    Packs/day: 0.50    Years: 72.00    Types: Cigarettes  . Smokeless tobacco: Never Used  . Alcohol use No  . Drug use: No  . Sexual activity: Yes   Other Topics Concern  . Not on file   Social History Narrative  . No narrative on file     Current Outpatient Prescriptions:  .  abiraterone Acetate (ZYTIGA) 250 MG tablet, Take 1,000 mg by mouth., Disp: , Rfl:  .  predniSONE (DELTASONE) 5 MG tablet, Take 10 mg by mouth., Disp: , Rfl:  .  amLODipine-benazepril (LOTREL) 5-20 MG capsule, Take 1 capsule by mouth daily., Disp: 90 capsule, Rfl: 1 .  aspirin EC 81 MG tablet, Take 81 mg by mouth., Disp: , Rfl:  .  escitalopram (LEXAPRO) 10 MG tablet, Take 1 tablet (10 mg total) by  mouth daily., Disp: 90 tablet, Rfl: 1 .  leuprolide (LUPRON) 22.5 MG injection, Inject 22.5 mg into the muscle., Disp: , Rfl:  .  Multiple Vitamin (MULTI-VITAMINS) TABS, Take by mouth., Disp: , Rfl:  .  vitamin C (ASCORBIC ACID) 250 MG tablet, Take 250 mg by mouth daily., Disp: , Rfl:   Allergies  Allergen Reactions  . Diazepam Other (See Comments)    Hallucinations, altered mental status     ROS  Constitutional: Negative for fever or significant  weight change.  Respiratory: Negative for cough and shortness of breath.   Cardiovascular: Negative for chest pain or palpitations.  Gastrointestinal: Negative for abdominal pain, no bowel changes.  Musculoskeletal: Negative for gait problem or joint swelling.  Skin: Negative for rash.  Neurological: Negative for  dizziness or headache.  No other specific complaints in a complete review of systems (except as listed in HPI above).  Objective  Vitals:   01/28/17 0957  BP: (!) 130/58  Pulse: 67  Resp: 16  Temp: 97.7 F (36.5 C)  SpO2: 97%  Weight: 201 lb 9 oz (91.4 kg)  Height: 5\' 10"  (1.778 m)    Body mass index is 28.92 kg/m.  Physical Exam  Constitutional: Patient appears well-developed and well-nourished. Obese No distress.  HEENT: head atraumatic, normocephalic, pupils equal and reactive to light, neck supple, throat within normal limits Cardiovascular: Normal rate, regular rhythm and normal heart sounds.  No murmur heard. 1 plus  BLE edema. Pulmonary/Chest: Effort normal and breath sounds normal. No respiratory distress. Abdominal: Soft.  There is no tenderness. Psychiatric: Patient has a normal mood and affect. behavior is normal. Judgment and thought content normal.  PHQ2/9: Depression screen PHQ 2/9 10/31/2016  Decreased Interest 0  Down, Depressed, Hopeless 0  PHQ - 2 Score 0     Fall Risk: Fall Risk  10/31/2016  Falls in the past year? No    Assessment & Plan  1. Essential hypertension  - amLODipine-benazepril (LOTREL) 5-20 MG capsule; Take 1 capsule by mouth daily.  Dispense: 90 capsule; Refill: 1 Mild edema, likely from Norvasc but is not bothering him  2. Prostate cancer metastatic to intrapelvic lymph node ([place])  He just started new medication on top of Lupron, also on Zytiga and prednisone  3. Calcification of abdominal aorta (HCC)  On aspirin only, he refuses statin therapy   4. Situational depression  - escitalopram (LEXAPRO) 10 MG tablet; Take 1 tablet (10 mg total) by mouth daily.  Dispense: 90 tablet; Refill: 1  5. Tobacco abuse  He is trying to quit smoking

## 2017-02-06 DIAGNOSIS — C61 Malignant neoplasm of prostate: Secondary | ICD-10-CM | POA: Diagnosis not present

## 2017-02-06 DIAGNOSIS — C775 Secondary and unspecified malignant neoplasm of intrapelvic lymph nodes: Secondary | ICD-10-CM | POA: Diagnosis not present

## 2017-02-26 DIAGNOSIS — C775 Secondary and unspecified malignant neoplasm of intrapelvic lymph nodes: Secondary | ICD-10-CM | POA: Diagnosis not present

## 2017-02-26 DIAGNOSIS — Z79899 Other long term (current) drug therapy: Secondary | ICD-10-CM | POA: Diagnosis not present

## 2017-02-26 DIAGNOSIS — Z5111 Encounter for antineoplastic chemotherapy: Secondary | ICD-10-CM | POA: Diagnosis not present

## 2017-02-26 DIAGNOSIS — F419 Anxiety disorder, unspecified: Secondary | ICD-10-CM | POA: Diagnosis not present

## 2017-02-26 DIAGNOSIS — Z79818 Long term (current) use of other agents affecting estrogen receptors and estrogen levels: Secondary | ICD-10-CM | POA: Diagnosis not present

## 2017-02-26 DIAGNOSIS — Z192 Hormone resistant malignancy status: Secondary | ICD-10-CM | POA: Diagnosis not present

## 2017-02-26 DIAGNOSIS — R69 Illness, unspecified: Secondary | ICD-10-CM | POA: Diagnosis not present

## 2017-02-26 DIAGNOSIS — R9721 Rising PSA following treatment for malignant neoplasm of prostate: Secondary | ICD-10-CM | POA: Diagnosis not present

## 2017-02-26 DIAGNOSIS — C61 Malignant neoplasm of prostate: Secondary | ICD-10-CM | POA: Diagnosis not present

## 2017-02-26 DIAGNOSIS — R351 Nocturia: Secondary | ICD-10-CM | POA: Diagnosis not present

## 2017-04-08 NOTE — Unmapped (Signed)
Specialty Pharmacy Refill Coordination Note     John Watkins is a 81 y.o. male contacted today regarding refills of his specialty medication(s).    Reviewed and verified with patient:      Specialty medication(s) and dose(s) confirmed: yes  Changes to medications: no  Changes to insurance: no    Medication Adherence    Patient reported X missed doses in the last month:  0  Specialty Medication:  Zytiga 250mg   Informant:  patient  Reliability of informant:  reliable  Provider-estimated medication adherence level:  90-100%  Patient is at risk for Non-Adherence:  No  Confirmed plan for next specialty medication refill:  delivery by pharmacy  Medication Assistance Program  Refill Coordination  Has the Patient's Contact Information Changed:  No  Is the Shipping Address Different:  No  Shipping Information  Delivery Scheduled:  Yes  Delivery Date:  04/14/17  Medications to be Shipped:  Zytiga 250mg , Prednisone 5mg           Follow-up: 1 month(s)     Genia Hotter

## 2017-04-13 MED FILL — ZYTIGA/250MG/TAB: ZYTIGA/250MG/TAB | 30 days supply | Qty: 120 | Fill #3

## 2017-04-13 MED FILL — PREDNISONE/5MG/TAB: PREDNISONE/5MG/TAB | 15 days supply | Qty: 30 | Fill #3

## 2017-04-13 NOTE — Unmapped (Signed)
[facility] Specialty Pharmacy Refill Coordination Note  Specialty Medication(s): Zytiga  Additional Medications shipped: prednisone    John Watkins, DOB: Jan 07, 1936  Phone: [phone] (home) , Alternate phone contact: N/A  Phone or address changes today?: No  All above HIPAA information was verified with patient.  Shipping Address: [address]  [place] [address]   Insurance changes? No    Completed refill call assessment today to schedule patient's medication shipment from the [facility] [phone]).      Confirmed the medication and dosage are correct and have not changed: Yes, regimen is correct and unchanged.    Confirmed patient started or stopped the following medications in the past month:  No, there are no changes reported at this time.    Are you tolerating your medication?:  [name] reports tolerating the medication.    ADHERENCE    (Below is required for Medicare Part B or Transplant patients only - per drug):   How many tablets were dispensed last month: 120  Patient currently has 12 tablets of Zytiga remaining.    Did you miss any doses in the past 4 weeks? No missed doses reported.    FINANCIAL/SHIPPING    Delivery Scheduled: Yes, Expected medication delivery date: 04/14/17     [name] did not have any additional questions at this time.    Delivery address validated in FSI scheduling system: Yes, address listed in FSI is correct.    We will follow up with patient monthly for standard refill processing and delivery.      Thank you,  Roderic Palau   [facility] Specialty Pharmacist

## 2017-04-28 ENCOUNTER — Other Ambulatory Visit: Payer: Self-pay | Admitting: Family Medicine

## 2017-04-28 DIAGNOSIS — I10 Essential (primary) hypertension: Secondary | ICD-10-CM

## 2017-05-08 NOTE — Unmapped (Signed)
[facility] Specialty Pharmacy Refill Coordination Note  Specialty Medication(s): Zytiga  Additional Medications shipped: prednisone    Eldwin Volkov, DOB: [birth year]  Phone: [phone] (home) , Alternate phone contact: N/A  Phone or address changes today?: No  All above HIPAA information was verified with patient.  Shipping Address: [address]  [place] [address]   Insurance changes? No    Completed refill call assessment today to schedule patient's medication shipment from the [facility] [phone]).      Confirmed the medication and dosage are correct and have not changed: Yes, regimen is correct and unchanged.    Confirmed patient started or stopped the following medications in the past month:  No, there are no changes reported at this time.    Are you tolerating your medication?:  Henryk reports tolerating the medication.    ADHERENCE    Did you miss any doses in the past 4 weeks? No missed doses reported.    FINANCIAL/SHIPPING    Delivery Scheduled: Yes, Expected medication delivery date: Tues, Aug 21     Peggy did not have any additional questions at this time.    Delivery address validated in FSI scheduling system: Yes, address listed in FSI is correct.    We will follow up with patient monthly for standard refill processing and delivery.      Thank you,  Tawanna Solo Shared [facility] Specialty Pharmacist

## 2017-05-11 MED FILL — PREDNISONE/5MG/TAB: PREDNISONE/5MG/TAB | 30 days supply | Qty: 60 | Fill #0

## 2017-05-11 MED FILL — ZYTIGA/250MG/TAB: ZYTIGA/250MG/TAB | 30 days supply | Qty: 120 | Fill #4

## 2017-05-28 ENCOUNTER — Ambulatory Visit
Admission: RE | Admit: 2017-05-28 | Discharge: 2017-05-28 | Disposition: A | Discharge status: Home / Self Care | Payer: MEDICARE | Attending: Medical Oncology | Admitting: Medical Oncology

## 2017-05-28 ENCOUNTER — Ambulatory Visit
Admission: RE | Admit: 2017-05-28 | Discharge: 2017-05-28 | Disposition: A | Discharge status: Home / Self Care | Payer: MEDICARE

## 2017-05-28 DIAGNOSIS — R232 Flushing: Secondary | ICD-10-CM

## 2017-05-28 DIAGNOSIS — C61 Malignant neoplasm of prostate: Principal | ICD-10-CM

## 2017-05-28 DIAGNOSIS — C775 Secondary and unspecified malignant neoplasm of intrapelvic lymph nodes: Secondary | ICD-10-CM

## 2017-05-28 DIAGNOSIS — R5383 Other fatigue: Secondary | ICD-10-CM

## 2017-05-28 DIAGNOSIS — R351 Nocturia: Secondary | ICD-10-CM

## 2017-05-28 DIAGNOSIS — C7951 Secondary malignant neoplasm of bone: Secondary | ICD-10-CM

## 2017-05-28 DIAGNOSIS — Z5112 Encounter for antineoplastic immunotherapy: Secondary | ICD-10-CM | POA: Diagnosis not present

## 2017-05-28 DIAGNOSIS — R69 Illness, unspecified: Secondary | ICD-10-CM | POA: Diagnosis not present

## 2017-05-28 DIAGNOSIS — Z192 Hormone resistant malignancy status: Secondary | ICD-10-CM | POA: Diagnosis not present

## 2017-05-28 DIAGNOSIS — R9721 Rising PSA following treatment for malignant neoplasm of prostate: Secondary | ICD-10-CM | POA: Diagnosis not present

## 2017-05-28 LAB — COMPREHENSIVE METABOLIC PANEL
ALBUMIN: 3.5 g/dL (ref 3.5–5.0)
ALKALINE PHOSPHATASE: 62 U/L (ref 38–126)
ALT (SGPT): 25 U/L (ref 19–72)
ANION GAP: 8 mmol/L — ABNORMAL LOW (ref 9–15)
AST (SGOT): 14 U/L — ABNORMAL LOW (ref 19–55)
BILIRUBIN TOTAL: 0.6 mg/dL (ref 0.0–1.2)
BLOOD UREA NITROGEN: 9 mg/dL (ref 7–21)
BUN / CREAT RATIO: 12
CALCIUM: 9.2 mg/dL (ref 8.5–10.2)
CREATININE: 0.76 mg/dL (ref 0.70–1.30)
EGFR MDRD AF AMER: >=60 mL/min/{1.73_m2} (ref >=60)
EGFR MDRD NON AF AMER: >=60 mL/min/{1.73_m2} (ref >=60)
GLUCOSE RANDOM: 102 mg/dL (ref 65–179)
POTASSIUM: 3.5 mmol/L (ref 3.5–5.0)
PROTEIN TOTAL: 6.1 g/dL — ABNORMAL LOW (ref 6.5–8.3)
SODIUM: 142 mmol/L (ref 135–145)

## 2017-05-28 LAB — TESTOSTERONE TOTAL: Testosterone:MCnc:Pt:Ser/Plas:Qn:: 28 — ABNORMAL LOW

## 2017-05-28 LAB — BILIRUBIN TOTAL: Bilirubin:MCnc:Pt:Ser/Plas:Qn:: 0.6

## 2017-05-28 LAB — PROSTATE SPECIFIC ANTIGEN: Prostate specific Ag:MCnc:Pt:Ser/Plas:Qn:: 4.08 — ABNORMAL HIGH

## 2017-05-28 NOTE — Unmapped (Signed)
Lab drawn and sent for analysis.

## 2017-05-28 NOTE — Unmapped (Addendum)
Clinical Pharmacist Practitioner: [facility]    Oral Chemotherapy Follow-Up    Assessment and recommendations:  1. Abiraterone monitoring and side effect management- doing well on abiraterone he has minor fatigue that does not interfere with his daily activities. He remains active but feels sluggish. LFTs WNL, and blood pressure well controlled.  - Continue abiraterone 1000 mg daily and prednisone 10 mg daily.     2. Hypokalemia - resolved since last check on 6/7 without intervention.    Follow- up: Return to clinic in 3 months for follow up    ______________________________________________________________________    Oral Chemotherapy: Abiraterone 1000 mg daily and prednisone 10 mg daily  Start date: 01/2017    HPI: Mr. 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    Interim History: Mr. Citro is doing well. He has no real complaints, he does have some fatigue that does not interfere with his activities. He continues to garden, mow the lawn ride the tractor, just feels more sluggish. He is not interested in started any medications for his fatigue but will let us know if it continues to worsen and is unable to do his honey-do-list.    Adherence: Takes 4 tablets first thing in the morning and waits at least an hour before eating anything. He takes his prednisone with dinner since that is the only meal he eats and prefers to take with food, but does not complain of insomnia. He reports he has not missed any doses.     Toxicities: Mild Fatigue    Drug-Drug Interactions: none and no new medications    Medications:  Current Outpatient Prescriptions   Medication Sig Dispense Refill   ??? abiraterone (ZYTIGA) 250 mg Tab tablet Take 4 tablets (1,000 mg total) by mouth daily. 120 tablet 6   ??? amLODIPine-benazepril (LOTREL 5-20) 5-20 mg per capsule Take 1 capsule by mouth daily.     ??? ascorbic acid, vitamin C, (VITAMIN C) 250 MG tablet Take 250 mg by mouth.     ??? aspirin (ECOTRIN) 81 MG tablet Take 81 mg by mouth.     ??? escitalopram oxalate (LEXAPRO) 10 MG tablet Take 1 tablet (10 mg total) by mouth daily. 90 tablet 1   ??? leuprolide (LUPRON) 22.5 mg injection Inject 22.5 mg into the muscle Every three (3) months.     ??? multivitamin (THERAGRAN) per tablet Take 1 tablet by mouth daily.     ??? predniSONE (DELTASONE) 5 MG tablet Take 2 tablets (10 mg total) by mouth daily. 60 tablet 6     No current facility-administered medications for this visit.        I spent 10 minutes with Mr.Boydstun in direct patient care.     Laverna Peace PharmD, BCOP, CPP  Hematology/Oncology Pharmacist  P: [phone]

## 2017-05-28 NOTE — Unmapped (Signed)
Addended by: Nita Sickle E on: 05/28/2017 11:21 AM     Modules accepted: Orders

## 2017-05-28 NOTE — Unmapped (Signed)
Medical Oncology (Prostate Cancer)    Referring physician:  Riki Altes, MD [facility])  Other physician:  Irven Easterly, MD    CC: Prostate cancer to lymph nodes and possibly bone s/p IMRT to prostate, with elevated PSA, on Lupron    Current therapy: Lupron    HPI:  2010: IMRT to prostate for T1c prostate adenocarcinoma, biopsy with up to Gleason 4+3=7.  Subsequent PSA rise with negative saturation biopsy on 11/17/12.  - 04/05/13: 8.3  - 08/29/13: 12.8  - 02/09/14: 13.3  10/2013: MRI with enlarged left obturator, iliac and para-aortic nodes up to 3 cm consistent with metastatic disease. Bilateral ischia and L pelvis with bony areas <54mm of unclear etiology (bone islands vs. metastases).  10/2013: Lupron started  Subsequent PSA:  - 05/01/14: 1.4  - 08/02/14: 1.4  - 11/03/14: 1.7  01/18/16: Bone scan: no metastases.  01/18/16: CT A/P: 2.6 cm left external iliac chain hyperenhancing node (series 6, image 57). Several prominent subcentimeter lymph nodes are seen along the left para-aortic retroperitoneum.  Prostate is enlarged measuring 6.0 x 6.7 x 9.3 cm. [Possible lipoma in gluteus maximus, or could be related to Lupron injection, discussed w urology, no action warranted.]  10/16/16: restaging:   - CT CAP: Interval increase in size of left external and common iliac chain as well as periaortic lymph nodes, worrisome for metastatic disease.   - Bone Scan: No evidence of osseous metastatic disease.  5/18: Abiraterone/prednisone    ROS: Nocturia: every 2-3 hours but drinks tea before bed, doesn't bother him.  No pain.  Fatigue.  Good appetite.  Has tolerated Lupron well.  Remainder of 10 system ROS negative.    PE  There were no vitals taken for this visit.  NAD  A+Ox3  No rash  No LEE  No cords  Neuro non focal  CTA  RRR  Abd NT ND    Current Outpatient Prescriptions   Medication Sig Dispense Refill   ??? abiraterone (ZYTIGA) 250 mg Tab tablet Take 4 tablets (1,000 mg total) by mouth daily. 120 tablet 6   ??? amLODIPine-benazepril (LOTREL 5-20) 5-20 mg per capsule Take 1 capsule by mouth daily.     ??? ascorbic acid, vitamin C, (VITAMIN C) 250 MG tablet Take 250 mg by mouth.     ??? aspirin (ECOTRIN) 81 MG tablet Take 81 mg by mouth.     ??? escitalopram oxalate (LEXAPRO) 10 MG tablet Take 1 tablet (10 mg total) by mouth daily. 90 tablet 1   ??? leuprolide (LUPRON) 22.5 mg injection Inject 22.5 mg into the muscle Every three (3) months.     ??? multivitamin (THERAGRAN) per tablet Take 1 tablet by mouth daily.     ??? predniSONE (DELTASONE) 5 MG tablet Take 2 tablets (10 mg total) by mouth daily. 60 tablet 6     No current facility-administered medications for this visit.      Allergies   Allergen Reactions   ??? Valium [Diazepam] Other (See Comments)     Hallucinations, altered mental status           Appointment on 05/28/2017   Component Date Value Ref Range Status   ??? 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  I previously asked Radiation Oncology to weigh in about potential RT to the lymph nodes, and they felt this would not be beneficial and would confer toxicity without likely long-term disease control.  He has been asymptomatic.  He has had RT to the prostate in the past.      Plan:  - Continue Lupron every three months (05/28/17).  - Continue abiraterone/prednisone.  - Fatigue on abiraterone - he does not want to try ritalin currently but might consider in the future.  - Follow PSA.  - Follow LFTs - today pending.  - Blood pressure control with his PCP.  - Nocturia: persistent, does not want Flomax.   - Anxiety: On Lexapro - Pharmacy ran interactions for both olaparib and abiraterone.  - Smoking: He understands risks, and the [place] smoking cessation program here has been meeting with him for counseling.  - Bone density was WNL.  Follow up for Lupron and monitoring on abiraterone.

## 2017-06-03 NOTE — Unmapped (Signed)
[facility] Specialty Pharmacy Refill Coordination Note  Specialty Medication(s): ZYTIGA  Additional Medications shipped: PREDNISONE    John Watkins, DOB: [birth year]  Phone: [phone] (home) , Alternate phone contact: N/A  Phone or address changes today?: No  All above HIPAA information was verified with patient.  Shipping Address: [address]  [place] [address]   Insurance changes? No    Completed refill call assessment today to schedule patient's medication shipment from the [facility] [phone]).      Confirmed the medication and dosage are correct and have not changed: Yes, regimen is correct and unchanged.    Confirmed patient started or stopped the following medications in the past month:  No, there are no changes reported at this time.    Are you tolerating your medication?:  John Watkins reports tolerating the medication.    ADHERENCE    Did you miss any doses in the past 4 weeks? No missed doses reported.    FINANCIAL/SHIPPING    Delivery Scheduled: Yes, Expected medication delivery date: 9/18     John Watkins did not have any additional questions at this time.    Delivery address validated in FSI scheduling system: Yes, address listed in FSI is correct.    We will follow up with patient monthly for standard refill processing and delivery.      Thank you,  Courtney Paris   [facility] Shared [facility] Specialty Pharmacist

## 2017-06-17 NOTE — Unmapped (Signed)
[employer] has denied mfg assistance for Zytiga. Denial reason: Patient is above income guidelines.

## 2017-06-18 MED FILL — ZYTIGA/250MG/TAB: ZYTIGA/250MG/TAB | 30 days supply | Qty: 120 | Fill #5

## 2017-06-18 MED FILL — PREDNISONE/5MG/TAB: PREDNISONE/5MG/TAB | 30 days supply | Qty: 60 | Fill #1

## 2017-06-18 NOTE — Unmapped (Signed)
Specialty Pharmacy Refill Coordination Note     John Watkins is a 81 y.o. male contacted today regarding refills of his specialty medication(s).    Reviewed and verified with patient:      Specialty medication(s) and dose(s) confirmed: yes  Changes to medications: no  Changes to insurance: no    Medication Adherence    Patient reported X missed doses in the last month:  0  Specialty Medication:  ZYTIGA 250 MG QOH 4   Patient is on additional specialty medications:  No  Demonstrates understanding of importance of adherence:  yes  Informant:  patient  Support network for adherence:  family member  Confirmed plan for next specialty medication refill:  delivery by pharmacy  Refills needed for supportive medications:  not needed       Medication Assistance Program    Medication Program Assistance Provided(If Applicable):  [employer] FUNDS DEPLETED        Refill Coordination    Has the Patients' Contact Information Changed:  No  Is the Shipping Address Different:  No       Shipping Information    Delivery Scheduled:  Yes  Delivery Date:  06/19/17  Medications to be Shipped:  ZYTIGA   PREDNISONE         FOR SOME REASON, PROBABLY HURRICANE RELATED, THE ORIGINAL REFILL FROM THE [facility] NEVER WENT OUT. PATIENT CALLED TODAY, SENDING OUT TODAY. PATIENT NOT UPSET, NOR HAS HE MISSED ANY DOSES.   Follow-up: 21 day(s)     Ledora Bottcher

## 2017-06-25 NOTE — Unmapped (Signed)
Contacted John Watkins, 81 y.o. by phone to provide 45-month follow-up for tobacco use/dependence. SW offered to re-enroll pt in services. Pt declined.     06/25/17 1440   Tobacco Use Treatment   Program [facility]   Type of Visit Scheduling   Tobacco Use Treatment Visit Talked with patient   Goals Of Session (1 session total)   Cancer Center Patients   Primary Cancer Type Urological   Tobacco Use Assessment (Past 30 days)   Time Since Last Tobacco Use smoked a cigarette today (at least one puff)   Type of Tobacco Products Used Cigarettes   Quantity Used 10   Quantity Per day   TTS Information   Diagnosis Tobacco use disorder, unspecified, uncomplicated (F17.200)   Interventions Assessed   TTS Visit Length 3-10 minutes   Follow-up Data 6 month FU     Pt seen by Norma Fredrickson, Social Work Intern  [facility]  MSW Candidate '20, [employer]  ??  Intern supervised and note reviewed by Ignacia Palma, MSW, LCSW, LCAS  Clinical Social Worker / Tobacco Treatment Specialist  Tobacco Treatment Program  [facility]  Phone: [phone]  Pager: [phone]

## 2017-07-07 ENCOUNTER — Encounter: Payer: Self-pay | Admitting: Family Medicine

## 2017-07-07 ENCOUNTER — Ambulatory Visit (INDEPENDENT_AMBULATORY_CARE_PROVIDER_SITE_OTHER): Payer: Medicare HMO | Admitting: Family Medicine

## 2017-07-07 VITALS — BP 120/80 | HR 80 | Temp 99.3°F | Resp 14 | Ht 70.0 in | Wt 187.3 lb

## 2017-07-07 DIAGNOSIS — J0100 Acute maxillary sinusitis, unspecified: Secondary | ICD-10-CM | POA: Diagnosis not present

## 2017-07-07 DIAGNOSIS — I700 Atherosclerosis of aorta: Secondary | ICD-10-CM | POA: Diagnosis not present

## 2017-07-07 DIAGNOSIS — R55 Syncope and collapse: Secondary | ICD-10-CM

## 2017-07-07 DIAGNOSIS — H938X2 Other specified disorders of left ear: Secondary | ICD-10-CM

## 2017-07-07 LAB — LIPID PANEL
CHOL/HDL RATIO: 3.1 (calc) (ref <5.0)
Cholesterol: 136 mg/dL (ref <200)
HDL: 44 mg/dL (ref >40)
LDL CHOLESTEROL (CALC): 74 mg/dL
NON-HDL CHOLESTEROL (CALC): 92 mg/dL (ref <130)
TRIGLYCERIDES: 95 mg/dL (ref <150)

## 2017-07-07 LAB — COMPLETE METABOLIC PANEL WITH GFR
AG RATIO: 1.4 (calc) (ref 1.0–2.5)
ALBUMIN MSPROF: 3.8 g/dL (ref 3.6–5.1)
ALT: 12 U/L (ref 9–46)
AST: 11 U/L (ref 10–35)
Alkaline phosphatase (APISO): 59 U/L (ref 40–115)
BUN: 12 mg/dL (ref 7–25)
CHLORIDE: 104 mmol/L (ref 98–110)
CO2: 27 mmol/L (ref 20–32)
Calcium: 9 mg/dL (ref 8.6–10.3)
Creat: 0.82 mg/dL (ref 0.70–1.18)
GFR, EST AFRICAN AMERICAN: 97 mL/min/{1.73_m2} (ref >=60)
GFR, Est Non African American: 84 mL/min/{1.73_m2} (ref >=60)
GLOBULIN: 2.7 g/dL (ref 1.9–3.7)
GLUCOSE: 96 mg/dL (ref 65–99)
POTASSIUM: 3.3 mmol/L — AB (ref 3.5–5.3)
SODIUM: 140 mmol/L (ref 135–146)
TOTAL PROTEIN: 6.5 g/dL (ref 6.1–8.1)
Total Bilirubin: 0.4 mg/dL (ref 0.2–1.2)

## 2017-07-07 LAB — CBC
HCT: 36.9 % — ABNORMAL LOW (ref 38.5–50.0)
Hemoglobin: 12.8 g/dL — ABNORMAL LOW (ref 13.2–17.1)
MCH: 32.7 pg (ref 27.0–33.0)
MCHC: 34.7 g/dL (ref 32.0–36.0)
MCV: 94.1 fL (ref 80.0–100.0)
MPV: 10.2 fL (ref 7.5–12.5)
Platelets: 361 10*3/uL (ref 140–400)
RBC: 3.92 10*6/uL — ABNORMAL LOW (ref 4.20–5.80)
RDW: 13.3 % (ref 11.0–15.0)
WBC: 13.1 10*3/uL — ABNORMAL HIGH (ref 3.8–10.8)

## 2017-07-07 MED ORDER — FLUTICASONE PROPIONATE 50 MCG/ACT NA SUSP: 2.0000 | Freq: Every day | NASAL | 0 refills | Status: DC

## 2017-07-07 MED ORDER — AMOXICILLIN-POT CLAVULANATE 875-125 MG PO TABS: 1.0000 | ORAL_TABLET | Freq: Two times a day (BID) | ORAL | 0 refills | Status: DC

## 2017-07-07 NOTE — Progress Notes (Signed)
Name: Kevin Donovan   MRN: [id number]    DOB: 1936-01-23   Date:07/07/2017       Progress Note  Subjective  Chief Complaint  Chief Complaint  Patient presents with  . Ear Fullness  . Cough    HPI  Pre-syncope: he had one episode a couple of months ago at home, he took a hot shower and while shaving he felt dizzy/room went bright. He sat down on the floor and within minutes symptoms resolved. A second episode happened while on vacation 6 days ago. They were in [place], he skipped lunch, and they had finished driving 5 hours in the rain, he felt dizzy again ( no spinning), the room went bright, he felt hot, wife noticed he did not look right so they placed him on the ground. Symptoms resolved once he sat down on the floor.  He is getting treated for prostate cancer. He has noticed some intermittent dizziness when he squats and gets up suddenly. He has mild daily headache when he started bp medication months ago.Denies bowel or bladder incontinence. No loss of memory.   Sinusitis: he noticed symptoms of post-nasal drainage, that makes him have a productive cough. Symptoms are getting progressively worse, states that over the past week noticed matted eyes in am's, right ear pain and hearing loss. No fever at home, has occasional chills, appetite is normal.   Abnormal aorta atherosclerosis: found on CT abdomen and pelvis. He prefers not taking statin therapy, but willing to get labs and re-evaluate, continue bp control and also aspirin daily   Patient Active Problem List   Diagnosis Date Noted  . Hot flashes 05/28/2017  . Fatigue 05/28/2017  . First degree AV block 11/28/2016  . Bradycardia on ECG 11/14/2016  . Elevated blood pressure reading 10/31/2016  . Situational depression 10/31/2016  . Calcification of abdominal aorta (HCC) 10/31/2016  . Degenerative disc disease, lumbar 10/31/2016  . Prostate cancer metastatic to intrapelvic lymph node ([place]) 10/23/2016  . Malignant neoplasm  of prostate ([place]) 08/27/2012  . Enlarged prostate with lower urinary tract symptoms (LUTS) 08/27/2012    Past Surgical History:  Procedure Laterality Date  . APPENDECTOMY  2011  . COLONOSCOPY  2005    Family History  Problem Relation Age of Onset  . Lupus Mother   . Heart disease Mother        CHF  . Heart disease Father     Social History   Social History  . Marital status: Married    Spouse name: N/A  . Number of children: N/A  . Years of education: N/A   Occupational History  . Not on file.   Social History Main Topics  . Smoking status: Current Every Day Smoker    Packs/day: 0.50    Years: 72.00    Types: Cigarettes  . Smokeless tobacco: Never Used  . Alcohol use No  . Drug use: No  . Sexual activity: Yes   Other Topics Concern  . Not on file   Social History Narrative  . No narrative on file     Current Outpatient Prescriptions:  .  abiraterone Acetate (ZYTIGA) 250 MG tablet, Take 1,000 mg by mouth., Disp: , Rfl:  .  amLODipine-benazepril (LOTREL) 5-20 MG capsule, TAKE 1 CAPSULE BY MOUTH ONCE DAILY, Disp: 90 capsule, Rfl: 0 .  aspirin EC 81 MG tablet, Take 81 mg by mouth., Disp: , Rfl:  .  escitalopram (LEXAPRO) 10 MG tablet, Take 1 tablet (10 mg total) by  mouth daily., Disp: 90 tablet, Rfl: 1 .  leuprolide (LUPRON) 22.5 MG injection, Inject 22.5 mg into the muscle., Disp: , Rfl:  .  Multiple Vitamin (MULTI-VITAMINS) TABS, Take by mouth., Disp: , Rfl:  .  predniSONE (DELTASONE) 5 MG tablet, Take 10 mg by mouth., Disp: , Rfl:  .  vitamin C (ASCORBIC ACID) 250 MG tablet, Take 250 mg by mouth daily., Disp: , Rfl:   Allergies  Allergen Reactions  . Diazepam Other (See Comments)    Hallucinations, altered mental status     ROS  Constitutional: Negative for fever or weight change.  Respiratory: Negative for cough and shortness of breath.   Cardiovascular: Negative for chest pain or palpitations.  Gastrointestinal: Negative for abdominal pain, no  bowel changes.  Musculoskeletal: positiive  for gait problem ( he states since radiation treatment back in 2010 has weakness on legs when walks and has to stop to rest)  or joint swelling.  Skin: Negative for rash.  Neurological: positive for orthostatic changes,  positive for daily  Headache going on for the past 4 months - since started therapy.  Two episodes of pre-syncope No other specific complaints in a complete review of systems (except as listed in HPI above).  Objective  Vitals:   07/07/17 1333  BP: 120/80  Pulse: 80  Resp: 14  Temp: 99.3 F (37.4 C)  TempSrc: Oral  SpO2: 97%  Weight: 187 lb 4.8 oz (85 kg)  Height: 5\' 10"  (1.778 m)    Body mass index is 26.87 kg/m.  Physical Exam  Constitutional: Patient appears well-developed and well-nourished.  No distress.  HEENT: head atraumatic, normocephalic, pupils equal and reactive to light, ears mild wax both ear canals,  neck supple, posterior pharynx, - post-nasal drainage ( white), mild drainage ( yellow from both eyes), injected conjunctiva, tender palpation to right maxillary sinus Cardiovascular: Normal rate, regular rhythm and normal heart sounds.  No murmur heard. 1 plus BLE edema. Pulmonary/Chest: Effort normal and breath sounds normal. No respiratory distress. Abdominal: Soft.  There is no tenderness. Psychiatric: Patient has a normal mood and affect. behavior is normal. Judgment and thought content normal.  PHQ2/9: Depression screen PHQ 2/9 10/31/2016  Decreased Interest 0  Down, Depressed, Hopeless 0  PHQ - 2 Score 0    Fall Risk: Fall Risk  07/07/2017 10/31/2016  Falls in the past year? No No    Assessment & Plan  1. Acute non-recurrent maxillary sinusitis  - amoxicillin-clavulanate (AUGMENTIN) 875-125 MG tablet; Take 1 tablet by mouth 2 (two) times daily.  Dispense: 20 tablet; Refill: 0  2. Pre-syncope  - COMPLETE METABOLIC PANEL WITH GFR - CBC Discussed referral to cardiologist if he continues to  have pre-syncopal episodes. Advised to have regular meals, also needs to get up slowly, and stay hydrated.  He has prostate cancer and may need CT brain, but he would like to hold off for now  3. Ear fullness, left  - fluticasone (FLONASE) 50 MCG/ACT nasal spray; Place 2 sprays into both nostrils daily.  Dispense: 16 g; Refill: 0  4. Calcification of abdominal aorta (HCC)  - Lipid panel   He is not on statin therapy , but he is taking aspirin daily, we will check labs. He states he does not like taking medications

## 2017-07-09 ENCOUNTER — Ambulatory Visit: Payer: Medicare HMO | Admitting: Family Medicine

## 2017-07-09 NOTE — Unmapped (Signed)
[facility] Refill Coordination Note  Specialty Medication(s): Zytiga  Additional Medications shipped: prednisone    John Watkins, DOB: [birth year]  Phone: [phone] (home) , Alternate phone contact: N/A  Phone or address changes today?: No  All above HIPAA information was verified with patient's family member.  Shipping Address: [address]  [place] [address]   Insurance changes? No    Completed refill call assessment today to schedule patient's medication shipment from the [facility] [phone]).      Confirmed the medication and dosage are correct and have not changed: Yes, regimen is correct and unchanged.    Confirmed patient started or stopped the following medications in the past month:  Yes. Dason reports starting the following medications: amoxicillin - short course for sinus infection    Are you tolerating your medication?:  Leibish reports tolerating the medication.    ADHERENCE    Did you miss any doses in the past 4 weeks? No missed doses reported.    FINANCIAL/SHIPPING    Delivery Scheduled: Yes, Expected medication delivery date: Thurs, Oct 25     Jamarcus did not have any additional questions at this time.    Delivery address validated in FSI scheduling system: Yes, address listed in FSI is correct.    We will follow up with patient monthly for standard refill processing and delivery.      Thank you,  Tawanna Solo Shared [facility] Specialty Pharmacist

## 2017-07-14 MED FILL — ZYTIGA/250MG/TAB: ZYTIGA/250MG/TAB | 30 days supply | Qty: 120 | Fill #6

## 2017-07-14 MED FILL — PREDNISONE/5MG/TABS: PREDNISONE/5MG/TABS | 30 days supply | Qty: 60 | Fill #2

## 2017-07-22 ENCOUNTER — Telehealth: Payer: Self-pay | Admitting: Family Medicine

## 2017-07-22 NOTE — Telephone Encounter (Signed)
Requesting another round of antibiotics. Wife Hildred Alamin) states pt is doing much better but feels that one more round would clear it up. Uses Tar heel drug

## 2017-07-23 ENCOUNTER — Other Ambulatory Visit: Payer: Self-pay | Admitting: Family Medicine

## 2017-07-23 DIAGNOSIS — I10 Essential (primary) hypertension: Secondary | ICD-10-CM

## 2017-07-23 NOTE — Telephone Encounter (Signed)
He needs to come in 

## 2017-07-23 NOTE — Telephone Encounter (Signed)
Mrs. Kevin Donovan (his wife) was informed and declined appt. They will keep next week appt

## 2017-07-23 NOTE — Telephone Encounter (Signed)
Refill request for Hypertension medication:  Lotrel  Last office visit pertaining to hypertension: 07/07/17  BP Readings from Last 3 Encounters:  07/07/17 120/80  01/28/17 (!) 130/58  11/28/16 132/84    Lab Results  Component Value Date   CREATININE 0.82 07/07/2017   BUN 12 07/07/2017   NA 140 07/07/2017   K 3.3 (L) 07/07/2017   CL 104 07/07/2017   CO2 27 07/07/2017    Follow up visit: 07/31/2017

## 2017-07-31 ENCOUNTER — Ambulatory Visit: Payer: Medicare HMO | Admitting: Family Medicine

## 2017-07-31 ENCOUNTER — Encounter: Payer: Self-pay | Admitting: Family Medicine

## 2017-07-31 VITALS — BP 124/76 | HR 74 | Temp 98.6°F | Resp 18 | Ht 70.0 in | Wt 188.3 lb

## 2017-07-31 DIAGNOSIS — C775 Secondary and unspecified malignant neoplasm of intrapelvic lymph nodes: Secondary | ICD-10-CM | POA: Diagnosis not present

## 2017-07-31 DIAGNOSIS — Z720 Tobacco use: Secondary | ICD-10-CM | POA: Diagnosis not present

## 2017-07-31 DIAGNOSIS — I739 Peripheral vascular disease, unspecified: Secondary | ICD-10-CM

## 2017-07-31 DIAGNOSIS — H938X2 Other specified disorders of left ear: Secondary | ICD-10-CM

## 2017-07-31 DIAGNOSIS — F4321 Adjustment disorder with depressed mood: Secondary | ICD-10-CM | POA: Diagnosis not present

## 2017-07-31 DIAGNOSIS — I10 Essential (primary) hypertension: Secondary | ICD-10-CM | POA: Diagnosis not present

## 2017-07-31 DIAGNOSIS — D692 Other nonthrombocytopenic purpura: Secondary | ICD-10-CM

## 2017-07-31 DIAGNOSIS — R69 Illness, unspecified: Secondary | ICD-10-CM | POA: Diagnosis not present

## 2017-07-31 DIAGNOSIS — C61 Malignant neoplasm of prostate: Secondary | ICD-10-CM

## 2017-07-31 DIAGNOSIS — I700 Atherosclerosis of aorta: Secondary | ICD-10-CM | POA: Diagnosis not present

## 2017-07-31 MED ORDER — ESCITALOPRAM OXALATE 10 MG PO TABS: 10.0000 mg | ORAL_TABLET | Freq: Every day | ORAL | 1 refills | Status: DC

## 2017-07-31 MED ORDER — AMLODIPINE BESY-BENAZEPRIL HCL 5-20 MG PO CAPS: 1.0000 | ORAL_CAPSULE | Freq: Every day | ORAL | 1 refills | Status: DC

## 2017-07-31 NOTE — Progress Notes (Signed)
Name: Kevin Donovan   MRN: [id number]    DOB: 1936-05-07   Date:07/31/2017       Progress Note  Subjective  Chief Complaint  Chief Complaint  Patient presents with  . Medication Refill    6 month F/U  . Hypertension  . Depression  . Prostate Cancer    HPI  HTN: he was going to start a research medication for prostate cancer and bp was elevated during [facility] visit, so he could not start therapy, but on May 5th he started a new regiment, bp at home has been well controlled with Lotrel 120's/70's  No chest pain or palpitation. Dizziness resolved after sinusitis treatment, eustachian tube dysfunction resolved with flonase   Prostate Cancer: he was diagnosed in 2008 but this month diagnosed with metastatic cancer, he decided not to pursue the research arm of therapy because it will increase his bp, but is on Lupron and added prednisone and Zytiga on May 5th, 2018. So far he has been tolerating therapy well. No side effects   Calcification abdominal aorta: found on CT abdomen, he has metastatic prostate cancer, continue aspirin , he does not want to take statins at this time  Depression: he states that since he started on medication ( Lexapro ) he has not noticed any difference in his behavior, however his wife has noticed a great improvement in his mood. He does not want to increase dose at this time. Unchanged   Bradycardia on EKG: done at [facility], but no change since 2014.   Tobacco: he is down to half  pack daily, instead to 1 pack. Wife is down to 5 cigarettes daily. He gave up on quitting smoking.   Claudication: he states he needs to stop after he walks for a prolonged period of time, he states it depends on the surface and how fast he is walking, discussed neurogenic versus vascular problems, he is not interested in having any studies done for it Patient Active Problem List   Diagnosis Date Noted  . Hot flashes 05/28/2017  . Fatigue 05/28/2017  . First degree AV block 11/28/2016   . Bradycardia on ECG 11/14/2016  . Situational depression 10/31/2016  . Calcification of abdominal aorta (HCC) 10/31/2016  . Degenerative disc disease, lumbar 10/31/2016  . Prostate cancer metastatic to intrapelvic lymph node ([place]) 10/23/2016  . Malignant neoplasm of prostate ([place]) 08/27/2012  . Enlarged prostate with lower urinary tract symptoms (LUTS) 08/27/2012    Past Surgical History:  Procedure Laterality Date  . APPENDECTOMY  2011  . COLONOSCOPY  2005    Family History  Problem Relation Age of Onset  . Lupus Mother   . Heart disease Mother        CHF  . Heart disease Father     Social History   Socioeconomic History  . Marital status: Married    Spouse name: Not on file  . Number of children: Not on file  . Years of education: Not on file  . Highest education level: Not on file  Social Needs  . Financial resource strain: Not on file  . Food insecurity - worry: Not on file  . Food insecurity - inability: Not on file  . Transportation needs - medical: Not on file  . Transportation needs - non-medical: Not on file  Occupational History  . Not on file  Tobacco Use  . Smoking status: Current Every Day Smoker    Packs/day: 0.50    Years: 72.00    Pack  years: 36.00    Types: Cigarettes  . Smokeless tobacco: Never Used  Substance and Sexual Activity  . Alcohol use: No  . Drug use: No  . Sexual activity: Yes  Other Topics Concern  . Not on file  Social History Narrative  . Not on file     Current Outpatient Medications:  .  abiraterone Acetate (ZYTIGA) 250 MG tablet, Take 1,000 mg by mouth., Disp: , Rfl:  .  amLODipine-benazepril (LOTREL) 5-20 MG capsule, TAKE 1 CAPSULE BY MOUTH ONCE DAILY, Disp: 90 capsule, Rfl: 1 .  aspirin EC 81 MG tablet, Take 81 mg by mouth., Disp: , Rfl:  .  escitalopram (LEXAPRO) 10 MG tablet, Take 1 tablet (10 mg total) by mouth daily., Disp: 90 tablet, Rfl: 1 .  leuprolide (LUPRON) 22.5 MG injection, Inject 22.5 mg every 3  (three) months into the muscle. , Disp: , Rfl:  .  Multiple Vitamin (MULTI-VITAMINS) TABS, Take by mouth., Disp: , Rfl:  .  predniSONE (DELTASONE) 5 MG tablet, Take 10 mg daily with breakfast by mouth. , Disp: , Rfl:  .  vitamin C (ASCORBIC ACID) 250 MG tablet, Take 250 mg by mouth daily., Disp: , Rfl:  .  fluticasone (FLONASE) 50 MCG/ACT nasal spray, Place 2 sprays into both nostrils daily., Disp: 16 g, Rfl: 0  Allergies  Allergen Reactions  . Diazepam Other (See Comments)    Hallucinations, altered mental status     ROS  Constitutional: Negative for fever or weight change.  Respiratory: Positive  for cough but denies  shortness of breath.   Cardiovascular: Negative for chest pain or palpitations.  Gastrointestinal: Negative for abdominal pain, no bowel changes.  Musculoskeletal: Negative for gait problem or joint swelling.  Skin: Negative for rash.  Neurological: Negative for dizziness or headache.  No other specific complaints in a complete review of systems (except as listed in HPI above).  Objective  Vitals:   07/31/17 1013  BP: 124/76  Pulse: 74  Resp: 18  Temp: 98.6 F (37 C)  TempSrc: Oral  SpO2: 96%  Weight: 188 lb 4.8 oz (85.4 kg)  Height: 5\' 10"  (1.778 m)    Body mass index is 27.02 kg/m.  Physical Exam  Constitutional: Patient appears well-developed and well-nourished. Overweight  No distress.  HEENT: head atraumatic, normocephalic, pupils equal and reactive to light, neck supple, throat within normal limits Cardiovascular: Normal rate, regular rhythm and normal heart sounds.  No murmur heard. No BLE edema. Pulmonary/Chest: Effort normal and breath sounds normal. No respiratory distress. Abdominal: Soft.  There is no tenderness. Psychiatric: Patient has a normal mood and affect. behavior is normal. Judgment and thought content normal.  Recent Results (from the past 2160 hour(s))  Lipid panel     Status: None   Collection Time: 07/07/17  2:31 PM   Result Value Ref Range   Cholesterol 136 <200 mg/dL   HDL 44 >40 mg/dL   Triglycerides 95 <150 mg/dL   LDL Cholesterol (Calc) 74 mg/dL (calc)    Comment: Reference range: <100 . Desirable range <100 mg/dL for primary prevention;   <70 mg/dL for patients with CHD or diabetic patients  with > or = 2 CHD risk factors. Marland Kitchen LDL-C is now calculated using the Martin-Hopkins  calculation, which is a validated novel method providing  better accuracy than the Friedewald equation in the  estimation of LDL-C.  Cresenciano Genre et al. Annamaria Helling. 9811;914(78): 2061-2068  (http://education.[employer].com/faq/FAQ164)    Total CHOL/HDL Ratio 3.1 <5.0 (calc)  Non-HDL Cholesterol (Calc) 92 <130 mg/dL (calc)    Comment: For patients with diabetes plus 1 major ASCVD risk  factor, treating to a non-HDL-C goal of <100 mg/dL  (LDL-C of <70 mg/dL) is considered a therapeutic  option.   COMPLETE METABOLIC PANEL WITH GFR     Status: Abnormal   Collection Time: 07/07/17  2:33 PM  Result Value Ref Range   Glucose, Bld 96 65 - 99 mg/dL    Comment: .            Fasting reference interval .    BUN 12 7 - 25 mg/dL   Creat 0.82 0.70 - 1.18 mg/dL    Comment: For patients >39 years of age, the reference limit for Creatinine is approximately 13% higher for people identified as African-American. .    GFR, Est Non African American 84 > OR = 60 mL/min/1.86m2   GFR, Est African American 97 > OR = 60 mL/min/1.54m2   BUN/Creatinine Ratio NOT APPLICABLE 6 - 22 (calc)   Sodium 140 135 - 146 mmol/L   Potassium 3.3 (L) 3.5 - 5.3 mmol/L   Chloride 104 98 - 110 mmol/L   CO2 27 20 - 32 mmol/L   Calcium 9.0 8.6 - 10.3 mg/dL   Total Protein 6.5 6.1 - 8.1 g/dL   Albumin 3.8 3.6 - 5.1 g/dL   Globulin 2.7 1.9 - 3.7 g/dL (calc)   AG Ratio 1.4 1.0 - 2.5 (calc)   Total Bilirubin 0.4 0.2 - 1.2 mg/dL   Alkaline phosphatase (APISO) 59 40 - 115 U/L   AST 11 10 - 35 U/L   ALT 12 9 - 46 U/L  CBC     Status: Abnormal    Collection Time: 07/07/17  2:33 PM  Result Value Ref Range   WBC 13.1 (H) 3.8 - 10.8 Thousand/uL   RBC 3.92 (L) 4.20 - 5.80 Million/uL   Hemoglobin 12.8 (L) 13.2 - 17.1 g/dL   HCT 36.9 (L) 38.5 - 50.0 %   MCV 94.1 80.0 - 100.0 fL   MCH 32.7 27.0 - 33.0 pg   MCHC 34.7 32.0 - 36.0 g/dL   RDW 13.3 11.0 - 15.0 %   Platelets 361 140 - 400 Thousand/uL   MPV 10.2 7.5 - 12.5 fL      PHQ2/9: Depression screen PHQ 2/9 10/31/2016  Decreased Interest 0  Down, Depressed, Hopeless 0  PHQ - 2 Score 0     Fall Risk: Fall Risk  07/31/2017 07/07/2017 10/31/2016  Falls in the past year? No No No    Functional Status Survey: Is the patient deaf or have difficulty hearing?: No Does the patient have difficulty seeing, even when wearing glasses/contacts?: No Does the patient have difficulty concentrating, remembering, or making decisions?: No Does the patient have difficulty walking or climbing stairs?: No Does the patient have difficulty dressing or bathing?: No Does the patient have difficulty doing errands alone such as visiting a doctor's office or shopping?: No   Assessment & Plan  1. Essential hypertension  - amLODipine-benazepril (LOTREL) 5-20 MG capsule; Take 1 capsule daily by mouth.  Dispense: 90 capsule; Refill: 1  2. Situational depression  - escitalopram (LEXAPRO) 10 MG tablet; Take 1 tablet (10 mg total) daily by mouth.  Dispense: 90 tablet; Refill: 1  3. Ear fullness, left  Resolved with Flonase  4. Calcification of abdominal aorta (HCC)  On aspirin   5. Tobacco abuse  He does not want to quit  He is not  interested in lung cancer screen or spirometry at this time, not interested in using inhalers.    7. Senile purpura (HCC)  Stable  8. Claudication [facility])  He wants to hold off on therapy, he states started after first therapy for cancer Likely neurogenic

## 2017-08-05 NOTE — Unmapped (Signed)
[facility] Refill Coordination Note  Specialty Medication(s): ZYTIGA  Additional Medications shipped: PREDNISONE    John Watkins, DOB: 1935/11/19  Phone: [phone] (home) , Alternate phone contact: N/A  Phone or address changes today?: No  All above HIPAA information was verified with patient's family member.  Shipping Address: [address]  [place] [address]   Insurance changes? No    Completed refill call assessment today to schedule patient's medication shipment from the [facility] [phone]).      Confirmed the medication and dosage are correct and have not changed: Yes, regimen is correct and unchanged.    Confirmed patient started or stopped the following medications in the past month:  No, there are no changes reported at this time.    Are you tolerating your medication?:  John Watkins reports tolerating the medication.    ADHERENCE    (Below is required for Medicare Part B or Transplant patients only - per drug):   How many tablets were dispensed last month:   Patient currently has  remaining.    Did you miss any doses in the past 4 weeks? No missed doses reported.    FINANCIAL/SHIPPING    Delivery Scheduled: Yes, Expected medication delivery date: 11/21.  However, Rx request for refills was sent to the provider as there are none remaining.     John Watkins did not have any additional questions at this time.    Delivery address validated in FSI scheduling system: Yes, address listed in FSI is correct.    We will follow up with patient monthly for standard refill processing and delivery.      Thank you,  Darlin Coco   [facility] Specialty Technician

## 2017-08-06 NOTE — Unmapped (Signed)
Please refill.

## 2017-08-10 MED ORDER — PREDNISONE 5 MG TABLET
ORAL_TABLET | Freq: Every day | ORAL | 6 refills | 0.00000 days | Status: CP
Start: 2017-08-10 — End: 2018-04-08

## 2017-08-10 MED ORDER — ABIRATERONE 250 MG TABLET
ORAL_TABLET | Freq: Every day | ORAL | 6 refills | 0.00000 days | Status: CP
Start: 2017-08-10 — End: 2018-04-08

## 2017-08-10 MED FILL — PREDNISONE/5MG/TABS: PREDNISONE/5MG/TABS | 30 days supply | Qty: 60 | Fill #0

## 2017-08-10 MED FILL — ZYTIGA/250MG/TAB: ZYTIGA/250MG/TAB | 30 days supply | Qty: 120 | Fill #0

## 2017-09-02 NOTE — Unmapped (Signed)
[facility] Refill Coordination Note  Specialty Medication(s): Zytiga 250mg , Prednisone 5mg       John Watkins, DOB: [birth year]  Phone: [phone] (home) , Alternate phone contact: N/A  Phone or address changes today?: No  All above HIPAA information was verified with patient.  Shipping Address: [address]  [place] [address]   Insurance changes? No    Completed refill call assessment today to schedule patient's medication shipment from the [facility] [phone]).      Confirmed the medication and dosage are correct and have not changed: Yes, regimen is correct and unchanged.    Confirmed patient started or stopped the following medications in the past month:  No, there are no changes reported at this time.    Are you tolerating your medication?:  John Watkins reports tolerating the medication.    ADHERENCE    Is this medicine transplant or covered by Medicare Part B? No.        Did you miss any doses in the past 4 weeks? No missed doses reported.    FINANCIAL/SHIPPING    Delivery Scheduled: Yes, Expected medication delivery date: 09/08/17     John Watkins did not have any additional questions at this time.    Delivery address validated in FSI scheduling system: Yes, address listed in FSI is correct.    We will follow up with patient monthly for standard refill processing and delivery.      Thank you,  Rea [employer]   [facility] Shared [facility] Specialty Pharmacist

## 2017-09-07 MED FILL — ZYTIGA/250MG/TAB: ZYTIGA/250MG/TAB | 30 days supply | Qty: 120 | Fill #1

## 2017-09-07 MED FILL — PREDNISONE/5MG/TABS: PREDNISONE/5MG/TABS | 30 days supply | Qty: 60 | Fill #1

## 2017-10-01 ENCOUNTER — Other Ambulatory Visit: Admit: 2017-10-01 | Discharge: 2017-10-02 | Payer: MEDICARE

## 2017-10-01 ENCOUNTER — Ambulatory Visit: Admit: 2017-10-01 | Discharge: 2017-10-02 | Payer: MEDICARE

## 2017-10-01 ENCOUNTER — Ambulatory Visit: Admit: 2017-10-01 | Discharge: 2017-10-02 | Payer: MEDICARE | Attending: Medical Oncology | Primary: Medical Oncology

## 2017-10-01 DIAGNOSIS — C7951 Secondary malignant neoplasm of bone: Secondary | ICD-10-CM

## 2017-10-01 DIAGNOSIS — C61 Malignant neoplasm of prostate: Principal | ICD-10-CM

## 2017-10-01 DIAGNOSIS — C775 Secondary and unspecified malignant neoplasm of intrapelvic lymph nodes: Secondary | ICD-10-CM

## 2017-10-01 DIAGNOSIS — Z79818 Long term (current) use of other agents affecting estrogen receptors and estrogen levels: Secondary | ICD-10-CM | POA: Diagnosis not present

## 2017-10-01 DIAGNOSIS — I10 Essential (primary) hypertension: Secondary | ICD-10-CM | POA: Diagnosis not present

## 2017-10-01 DIAGNOSIS — R69 Illness, unspecified: Secondary | ICD-10-CM | POA: Diagnosis not present

## 2017-10-01 DIAGNOSIS — F419 Anxiety disorder, unspecified: Secondary | ICD-10-CM | POA: Diagnosis not present

## 2017-10-01 DIAGNOSIS — Z885 Allergy status to narcotic agent status: Secondary | ICD-10-CM | POA: Diagnosis not present

## 2017-10-01 DIAGNOSIS — R5383 Other fatigue: Secondary | ICD-10-CM | POA: Diagnosis not present

## 2017-10-01 DIAGNOSIS — Z5112 Encounter for antineoplastic immunotherapy: Secondary | ICD-10-CM | POA: Diagnosis not present

## 2017-10-01 DIAGNOSIS — R351 Nocturia: Secondary | ICD-10-CM | POA: Diagnosis not present

## 2017-10-01 LAB — COMPREHENSIVE METABOLIC PANEL
ALBUMIN: 3.8 g/dL (ref 3.5–5.0)
ALKALINE PHOSPHATASE: 58 U/L (ref 38–126)
ALT (SGPT): 28 U/L (ref 19–72)
AST (SGOT): 15 U/L — ABNORMAL LOW (ref 19–55)
BILIRUBIN TOTAL: 0.6 mg/dL (ref 0.0–1.2)
BLOOD UREA NITROGEN: 12 mg/dL (ref 7–21)
BUN / CREAT RATIO: 15
CALCIUM: 9.4 mg/dL (ref 8.5–10.2)
CHLORIDE: 107 mmol/L (ref 98–107)
CO2: 27 mmol/L (ref 22.0–30.0)
CREATININE: 0.8 mg/dL (ref 0.70–1.30)
EGFR MDRD NON AF AMER: >=60 mL/min/{1.73_m2} (ref >=60)
GLUCOSE RANDOM: 98 mg/dL (ref 65–179)
POTASSIUM: 3.9 mmol/L (ref 3.5–5.0)
PROTEIN TOTAL: 6 g/dL — ABNORMAL LOW (ref 6.5–8.3)
SODIUM: 138 mmol/L (ref 135–145)

## 2017-10-01 LAB — TESTOSTERONE TOTAL: Testosterone:MCnc:Pt:Ser/Plas:Qn:: 20 — ABNORMAL LOW

## 2017-10-01 LAB — AST (SGOT): Aspartate aminotransferase:CCnc:Pt:Ser/Plas:Qn:: 15 — ABNORMAL LOW

## 2017-10-01 LAB — PROSTATE SPECIFIC ANTIGEN: Prostate specific Ag:MCnc:Pt:Ser/Plas:Qn:: 5.72 — ABNORMAL HIGH

## 2017-10-01 NOTE — Unmapped (Signed)
Medical Oncology (Prostate Cancer)    Referring physician:  Riki Altes, MD [facility])  Other physician:  Irven Easterly, MD    CC: Prostate cancer to lymph nodes and possibly bone s/p IMRT to prostate, with elevated PSA, on Lupron    Current therapy: Lupron    HPI:  2010: IMRT to prostate for T1c prostate adenocarcinoma, biopsy with up to Gleason 4+3=7.  Subsequent PSA rise with negative saturation biopsy on 11/17/12.  - 04/05/13: 8.3  - 08/29/13: 12.8  - 02/09/14: 13.3  10/2013: MRI with enlarged left obturator, iliac and para-aortic nodes up to 3 cm consistent with metastatic disease. Bilateral ischia and L pelvis with bony areas <63mm of unclear etiology (bone islands vs. metastases).  10/2013: Lupron started  Subsequent PSA:  - 05/01/14: 1.4  - 08/02/14: 1.4  - 11/03/14: 1.7  01/18/16: Bone scan: no metastases.  01/18/16: CT A/P: 2.6 cm left external iliac chain hyperenhancing node (series 6, image 57). Several prominent subcentimeter lymph nodes are seen along the left para-aortic retroperitoneum.  Prostate is enlarged measuring 6.0 x 6.7 x 9.3 cm. [Possible lipoma in gluteus maximus, or could be related to Lupron injection, discussed w urology, no action warranted.]  10/16/16: restaging:   - CT CAP: Interval increase in size of left external and common iliac chain as well as periaortic lymph nodes, worrisome for metastatic disease.   - Bone Scan: No evidence of osseous metastatic disease.  5/18: Abiraterone/prednisone    ROS: Nocturia: every 2-3 hours but drinks tea before bed, doesn't bother him.  No pain.  Fatigue.  Good appetite.  Has tolerated Lupron well.  Remainder of 10 system ROS negative.    PE  BP 157/62  - Pulse 64  - Temp 36.8 ??C (98.2 ??F) (Oral)  - Resp 16  - Ht 174.6 cm (5' 8.74)  - Wt 84 kg (185 lb 3.2 oz)  - SpO2 96%  - BMI 27.56 kg/m??   NAD  A+Ox3  No rash  No LEE  No cords  Neuro non focal  CTA  RRR  Abd NT ND    Current Outpatient Prescriptions   Medication Sig Dispense Refill   ??? abiraterone (ZYTIGA) 250 mg Tab tablet Take 4 tablets (1,000 mg total) by mouth daily. 120 tablet 6   ??? amLODIPine-benazepril (LOTREL 5-20) 5-20 mg per capsule Take 1 capsule by mouth daily.     ??? ascorbic acid, vitamin C, (VITAMIN C) 250 MG tablet Take 250 mg by mouth.     ??? aspirin (ECOTRIN) 81 MG tablet Take 81 mg by mouth.     ??? escitalopram oxalate (LEXAPRO) 10 MG tablet Take 1 tablet (10 mg total) by mouth daily. 90 tablet 1   ??? leuprolide (LUPRON) 22.5 mg injection Inject 22.5 mg into the muscle Every three (3) months.     ??? multivitamin (THERAGRAN) per tablet Take 1 tablet by mouth daily.     ??? predniSONE (DELTASONE) 5 MG tablet Take 2 tablets (10 mg total) by mouth daily. 60 tablet 6     No current facility-administered medications for this visit.      Allergies   Allergen Reactions   ??? Valium [Diazepam] Other (See Comments)     Hallucinations, altered mental status  Hallucinations, altered mental status           Lab on 10/01/2017   Component Date Value Ref Range Status   ??? PSA 10/01/2017 5.72* 0.00 - 4.00 ng/mL Final   ??? Testosterone  10/01/2017 20* 179 - 756 ng/dL Final   ??? Sodium 16/06/9603 138  135 - 145 mmol/L Final   ??? Potassium 10/01/2017 3.9  3.5 - 5.0 mmol/L Final   ??? Chloride 10/01/2017 107  98 - 107 mmol/L Final   ??? CO2 10/01/2017 27.0  22.0 - 30.0 mmol/L Final   ??? BUN 10/01/2017 12  7 - 21 mg/dL Final   ??? Creatinine 10/01/2017 0.80  0.70 - 1.30 mg/dL Final   ??? BUN/Creatinine Ratio 10/01/2017 15   Final   ??? EGFR MDRD Non Af Amer 10/01/2017 >=60  >=60 mL/min/1.7m2 Final   ??? EGFR MDRD Af Amer 10/01/2017 >=60  >=60 mL/min/1.55m2 Final   ??? Anion Gap 10/01/2017 4* 9 - 15 mmol/L Final   ??? Glucose 10/01/2017 98  65 - 179 mg/dL Final   ??? Calcium 54/05/8118 9.4  8.5 - 10.2 mg/dL Final   ??? Albumin 14/78/2956 3.8  3.5 - 5.0 g/dL Final   ??? Total Protein 10/01/2017 6.0* 6.5 - 8.3 g/dL Final   ??? Total Bilirubin 10/01/2017 0.6  0.0 - 1.2 mg/dL Final   ??? AST 21/30/8657 15* 19 - 55 U/L Final   ??? ALT 10/01/2017 28  19 - 72 U/L Final   ??? Alkaline Phosphatase 10/01/2017 58  38 - 126 U/L Final       Lab Results   Component Value Date    PSA 5.72 (H) 10/01/2017    PSA 4.08 (H) 05/28/2017    PSA 5.22 (H) 02/26/2017    PSA 8.32 (H) 02/06/2017    PSA 9.60 (H) 11/03/2016    PSA 10.90 (H) 10/23/2016       Impression:   Castration-resistant metastatic prostate cancer to multiple lymph nodes (progression on Lupron), on abiraterone (added 5/18).  I previously asked Radiation Oncology to weigh in about potential RT to the lymph nodes, and they felt this would not be beneficial and would confer toxicity without likely long-term disease control.  He has been asymptomatic.  He has had RT to the prostate in the past.      Plan:  - Continue Lupron every three months (10/01/16).  - Continue abiraterone/prednisone.  - Fatigue on abiraterone - he does not want to try ritalin currently but might consider in the future.  - Follow PSA.  - Follow LFTs - today pending.  - Blood pressure control with his PCP.  - Nocturia: persistent, does not want Flomax.   - Anxiety: On Lexapro - Pharmacy ran interactions for both olaparib and abiraterone.  - Smoking: He understands risks, and the [facility] smoking cessation program here has been meeting with him for counseling.  - Bone density was WNL.  Follow up for Lupron and monitoring on abiraterone.

## 2017-10-01 NOTE — Unmapped (Signed)
Labs drawn and sent for analysis.  Care provided by  Y Belarus, RN

## 2017-10-02 NOTE — Unmapped (Addendum)
Clinical Pharmacist Practitioner: Genitourinary Clinic    Oral Chemotherapy Follow-Up    Assessment and recommendations:  1. Zytiga Therapy Monitoring: patient reports that he is feeling well today, and that he is having no issues with taking Zytiga. Patient's BP today in clinic was 157/62 (which he says is much higher than what it is at home, and that he checks it multiple times a week), and monitoring labs are WNL. His Zytiga prescription costs him roughly $500 a month, but he does not qualify for manufacturer assistance or a grant at this time.   -continue zytiga 1000 mg daily + prednisone 10 mg daily, and lupron  -follow-up with BP at next clinic appointment    Follow- up: With Dr. Vernell Barrier in clinic on 01/07/2018    ______________________________________________________________________    Oral Chemotherapy: Zytiga 1000 mg daily + prednisone 10 mg      Start date: 01/2017    HPI: Castrate-resistant metastatic prostate cancer on Zytiga + Lupron.     Interim History: John Watkins reports feeling well today. His blood pressure was high, and he mentioned that it is always high when he comes to clinic because he gets anxious. His wife checks his BP several times throughout the week and it typically runs in the 140s/70s. He has no complaints with the Zytiga, except for his current copay of ~$500/month. He says that he is willing to pay for it as long as it is working. Investigation was completed to see if resources were available to assist with the cost for the patient, but unfortunately at this time, there is nothing that can be offered to him (refer to Ms. Richrd Prime Sampson's note on 10/01/17).     Adherence: Good-he takes 4 tablets a day without food. He has not missed any doses.       Drug-Drug Interactions: none     Other issues discussed: Copay is very expensive for patient. He pays ~$500 a month. No resources are available at this time to assist the patient with his copay. This will be reinvestigated once his current grant is up for renewal.     Medications:  Current Outpatient Prescriptions   Medication Sig Dispense Refill   ??? abiraterone (ZYTIGA) 250 mg Tab tablet Take 4 tablets (1,000 mg total) by mouth daily. 120 tablet 6   ??? amLODIPine-benazepril (LOTREL 5-20) 5-20 mg per capsule Take 1 capsule by mouth daily.     ??? ascorbic acid, vitamin C, (VITAMIN C) 250 MG tablet Take 250 mg by mouth.     ??? aspirin (ECOTRIN) 81 MG tablet Take 81 mg by mouth.     ??? escitalopram oxalate (LEXAPRO) 10 MG tablet Take 1 tablet (10 mg total) by mouth daily. 90 tablet 1   ??? leuprolide (LUPRON) 22.5 mg injection Inject 22.5 mg into the muscle Every three (3) months.     ??? multivitamin (THERAGRAN) per tablet Take 1 tablet by mouth daily.     ??? predniSONE (DELTASONE) 5 MG tablet Take 2 tablets (10 mg total) by mouth daily. 60 tablet 6     No current facility-administered medications for this visit.        I spent 20 minutes with John Watkins in direct patient care.     Rozanna Boer, PharmD, MSPS  PGY2 Hematology/Oncology Pharmacy Resident  Pager: [phone]    I was the precepting pharmacist in the delivery of services. I agree with the plan as documented.    Laverna Peace PharmD, BCOP, CPP  Hematology/Oncology Pharmacist  P: [phone]

## 2017-10-02 NOTE — Unmapped (Signed)
Medication Assistance Program Update:    Met with patient in clinic today.   ??  Introduced myself and spoke with patient about MAP services.    Patient had a grant earlier last year, but the funds have been exhausted and the grant is not up for renewal yet. Unable to apply for any other grants at this time and patient was denied manufacturer assistance due to income.    Spoke with Mr. Goetzke once he was home and let him know that we are unable to apply for any grants at this time, but I will set up a reminder to re-apply for his grant once it is up for renewal.    Time: 10 min  ??  Rayvon Char  Pharmacy Transition Specialist  Medication Assistance Program

## 2017-10-02 NOTE — Unmapped (Signed)
[facility] Specialty Pharmacy Refill Coordination Note  Specialty Medication(s): Zytiga  Additional Medications shipped: prednisone    John Watkins, DOB: 09-22-1936  Phone: [phone] (home) , Alternate phone contact: N/A  Phone or address changes today?: No  All above HIPAA information was verified with patient.  Shipping Address: [address]  [place] [address]   Insurance changes? No    Completed refill call assessment today to schedule patient's medication shipment from the [facility] [phone]).      Confirmed the medication and dosage are correct and have not changed: Yes, regimen is correct and unchanged.    Confirmed patient started or stopped the following medications in the past month:  No, there are no changes reported at this time.    Are you tolerating your medication?:  [name] reports tolerating the medication.    ADHERENCE    Did you miss any doses in the past 4 weeks? No missed doses reported.    FINANCIAL/SHIPPING    Delivery Scheduled: Yes, Expected medication delivery date: Thurs, Jan 17     [name] did not have any additional questions at this time.    Delivery address validated in FSI scheduling system: Yes, address listed in FSI is correct.    We will follow up with patient monthly for standard refill processing and delivery.      Thank you,  Tawanna Solo Shared [facility] Specialty Pharmacist

## 2017-10-07 MED FILL — ZYTIGA/250MG/TAB: ZYTIGA/250MG/TAB | 30 days supply | Qty: 120 | Fill #2

## 2017-10-07 MED FILL — PREDNISONE/5MG/TABS: PREDNISONE/5MG/TABS | 30 days supply | Qty: 60 | Fill #2

## 2017-10-29 NOTE — Unmapped (Signed)
[facility] Specialty Pharmacy Refill Coordination Note  Specialty Medication(s): ZYTIGA  Additional Medications shipped: PREDNISONE    Tellis Spivak, DOB: 1936-02-10  Phone: [phone] (home) , Alternate phone contact: N/A  Phone or address changes today?: No  All above HIPAA information was verified with patient's family member. Spoke with wife [facility] - caregiver  Shipping Address: [address] [place] [address]   Insurance changes? No    Completed refill call assessment today to schedule patient's medication shipment from the [facility] [phone]).      Confirmed the medication and dosage are correct and have not changed: Yes, regimen is correct and unchanged.    Confirmed patient started or stopped the following medications in the past month:  No, there are no changes reported at this time.    Are you tolerating your medication?:  Olegario reports tolerating the medication.    ADHERENCE      Did you miss any doses in the past 4 weeks? No missed doses reported.    FINANCIAL/SHIPPING    Delivery Scheduled: Yes, Expected medication delivery date: 11/04/17     Ryshawn did not have any additional questions at this time.    Delivery address validated in FSI scheduling system: Yes, address listed in FSI is correct.    We will follow up with patient monthly for standard refill processing and delivery.      Thank you,  Thad Ranger   [facility] Shared [facility] Specialty Pharmacist

## 2017-11-02 MED FILL — ZYTIGA/250MG/TAB: ZYTIGA/250MG/TAB | 30 days supply | Qty: 120 | Fill #3

## 2017-11-02 MED FILL — PREDNISONE/5MG/TABS: PREDNISONE/5MG/TABS | 30 days supply | Qty: 60 | Fill #3

## 2017-11-30 NOTE — Unmapped (Signed)
[facility] Specialty Pharmacy Refill Coordination Note  Specialty Medication(s): Zytiga 250mg , Prednisone 5mg     John Watkins, DOB: [birth year]  Phone: [phone] (home) , Alternate phone contact: N/A  Phone or address changes today?: No  All above HIPAA information was verified with patient.  Shipping Address: [address]  [place] [address]   Insurance changes? No    Completed refill call assessment today to schedule patient's medication shipment from the [facility] [phone]).      Confirmed the medication and dosage are correct and have not changed: Yes, regimen is correct and unchanged.    Confirmed patient started or stopped the following medications in the past month:  No, there are no changes reported at this time.    Are you tolerating your medication?:  John Watkins reports tolerating the medication.    ADHERENCE    Is this medicine transplant or covered by Medicare Part B? No.        Did you miss any doses in the past 4 weeks? No missed doses reported.    FINANCIAL/SHIPPING    Delivery Scheduled: Yes, Expected medication delivery date: 12/08/17     The patient will receive an FSI print out for each medication shipped and additional FDA Medication Guides as required.  Patient education from [place] or Robet Leu may also be included in the shipment.    John Watkins did not have any additional questions at this time.    Delivery address validated in FSI scheduling system: Yes, address listed in FSI is correct.    We will follow up with patient monthly for standard refill processing and delivery.      Thank you,  Rea College   [facility] Shared [facility] Specialty Pharmacist

## 2017-12-06 MED FILL — ZYTIGA/250MG/TAB: ZYTIGA/250MG/TAB | 30 days supply | Qty: 120 | Fill #4

## 2017-12-06 MED FILL — PREDNISONE/5MG/TABS: PREDNISONE/5MG/TABS | 30 days supply | Qty: 60 | Fill #4

## 2018-01-04 NOTE — Unmapped (Signed)
[facility] Specialty Pharmacy Refill Coordination Note  Specialty Medication(s): Zytiga 250mg , Prednisone 5mg       John Watkins, DOB: 09/09/1936  Phone: [phone] (home) , Alternate phone contact: N/A  Phone or address changes today?: No  All above HIPAA information was verified with patient.  Shipping Address: [address]  [place] [address]   Insurance changes? No    Completed refill call assessment today to schedule patient's medication shipment from the [facility] [phone]).      Confirmed the medication and dosage are correct and have not changed: Yes, regimen is correct and unchanged.    Confirmed patient started or stopped the following medications in the past month:  No, there are no changes reported at this time.    Are you tolerating your medication?:  John Watkins reports tolerating the medication.    ADHERENCE    Is this medicine transplant or covered by Medicare Part B? No.        Did you miss any doses in the past 4 weeks? No missed doses reported.    FINANCIAL/SHIPPING    Delivery Scheduled: Yes, Expected medication delivery date: 01/12/18     The patient will receive an FSI print out for each medication shipped and additional FDA Medication Guides as required.  Patient education from [place] or John Watkins may also be included in the shipment.    John Watkins did not have any additional questions at this time.    Delivery address validated in FSI scheduling system: Yes, address listed in FSI is correct.    We will follow up with patient monthly for standard refill processing and delivery.      Thank you,  John Watkins   John Watkins Shared [facility] Specialty Pharmacist

## 2018-01-07 ENCOUNTER — Ambulatory Visit: Admit: 2018-01-07 | Discharge: 2018-01-08 | Payer: MEDICARE | Attending: Medical Oncology | Primary: Medical Oncology

## 2018-01-07 ENCOUNTER — Other Ambulatory Visit: Admit: 2018-01-07 | Discharge: 2018-01-08 | Payer: MEDICARE

## 2018-01-07 DIAGNOSIS — C61 Malignant neoplasm of prostate: Secondary | ICD-10-CM

## 2018-01-07 DIAGNOSIS — Z7952 Long term (current) use of systemic steroids: Secondary | ICD-10-CM

## 2018-01-07 DIAGNOSIS — C7951 Secondary malignant neoplasm of bone: Secondary | ICD-10-CM

## 2018-01-07 DIAGNOSIS — C775 Secondary and unspecified malignant neoplasm of intrapelvic lymph nodes: Secondary | ICD-10-CM

## 2018-01-07 DIAGNOSIS — M8580 Other specified disorders of bone density and structure, unspecified site: Secondary | ICD-10-CM

## 2018-01-07 DIAGNOSIS — Z79818 Long term (current) use of other agents affecting estrogen receptors and estrogen levels: Secondary | ICD-10-CM | POA: Diagnosis not present

## 2018-01-07 DIAGNOSIS — Z79899 Other long term (current) drug therapy: Secondary | ICD-10-CM | POA: Diagnosis not present

## 2018-01-07 DIAGNOSIS — Z888 Allergy status to other drugs, medicaments and biological substances status: Secondary | ICD-10-CM | POA: Diagnosis not present

## 2018-01-07 DIAGNOSIS — R69 Illness, unspecified: Secondary | ICD-10-CM | POA: Diagnosis not present

## 2018-01-07 DIAGNOSIS — Z5112 Encounter for antineoplastic immunotherapy: Secondary | ICD-10-CM | POA: Diagnosis not present

## 2018-01-07 DIAGNOSIS — F419 Anxiety disorder, unspecified: Secondary | ICD-10-CM | POA: Diagnosis not present

## 2018-01-07 DIAGNOSIS — R351 Nocturia: Secondary | ICD-10-CM | POA: Diagnosis not present

## 2018-01-07 DIAGNOSIS — Z7982 Long term (current) use of aspirin: Secondary | ICD-10-CM | POA: Diagnosis not present

## 2018-01-07 LAB — COMPREHENSIVE METABOLIC PANEL
ALBUMIN: 3.6 g/dL (ref 3.5–5.0)
ALKALINE PHOSPHATASE: 58 U/L (ref 38–126)
ALT (SGPT): 24 U/L (ref 19–72)
ANION GAP: 5 mmol/L — ABNORMAL LOW (ref 9–15)
BLOOD UREA NITROGEN: 17 mg/dL (ref 7–21)
BUN / CREAT RATIO: 20
CALCIUM: 9.4 mg/dL (ref 8.5–10.2)
CHLORIDE: 107 mmol/L (ref 98–107)
CO2: 27 mmol/L (ref 22.0–30.0)
CREATININE: 0.85 mg/dL (ref 0.70–1.30)
EGFR MDRD AF AMER: >=60 mL/min/{1.73_m2} (ref >=60)
EGFR MDRD NON AF AMER: >=60 mL/min/{1.73_m2} (ref >=60)
GLUCOSE RANDOM: 85 mg/dL (ref 65–179)
POTASSIUM: 3.8 mmol/L (ref 3.5–5.0)
PROTEIN TOTAL: 6.2 g/dL — ABNORMAL LOW (ref 6.5–8.3)
SODIUM: 139 mmol/L (ref 135–145)

## 2018-01-07 LAB — TESTOSTERONE TOTAL: Testosterone:MCnc:Pt:Ser/Plas:Qn:: 33 — ABNORMAL LOW

## 2018-01-07 LAB — ALBUMIN: Albumin:MCnc:Pt:Ser/Plas:Qn:: 3.6

## 2018-01-07 LAB — PROSTATE SPECIFIC ANTIGEN: Prostate specific Ag:MCnc:Pt:Ser/Plas:Qn:: 7.8 — ABNORMAL HIGH

## 2018-01-07 NOTE — Unmapped (Signed)
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  I previously asked Radiation Oncology to weigh in about potential RT to the lymph nodes, and they felt this would not be beneficial and would confer toxicity without likely long-term disease control.  He has been asymptomatic.  He has had RT to the prostate in the past.      Plan:  - Continue Lupron every three months (01/07/17).  - Continue abiraterone/prednisone.  - Fatigue on abiraterone - he does not want to try ritalin currently but might consider in the future.  - Follow PSA.  - Follow LFTs - today pending.  - Blood pressure control with his PCP.  - Nocturia: persistent, does not want Flomax.   - Anxiety: On Lexapro - Pharmacy ran interactions for both olaparib and abiraterone.  - Smoking: He understands risks, and the [facility] smoking cessation program here has been meeting with him for counseling.  - Bone density was WNL in 2016, will recheck BMD at next visit.  Follow up for Lupron and monitoring on abiraterone.    -------------------------------------------------------    Referring physician:  Riki Altes, MD [facility])  Other physician:  Irven Easterly, MD    CC: Prostate cancer to lymph nodes and possibly bone s/p IMRT to prostate, with elevated PSA, on Lupron    Current therapy: Lupron    HPI:  2010: IMRT to prostate for T1c prostate adenocarcinoma, biopsy with up to Gleason 4+3=7.  Subsequent PSA rise with negative saturation biopsy on 11/17/12.  - 04/05/13: 8.3  - 08/29/13: 12.8  - 02/09/14: 13.3  10/2013: MRI with enlarged left obturator, iliac and para-aortic nodes up to 3 cm consistent with metastatic disease. Bilateral ischia and L pelvis with bony areas <4mm of unclear etiology (bone islands vs. metastases).  10/2013: Lupron started  Subsequent PSA:  - 05/01/14: 1.4  - 08/02/14: 1.4  - 11/03/14: 1.7  01/18/16: Bone scan: no metastases.  01/18/16: CT A/P: 2.6 cm left external iliac chain hyperenhancing node (series 6, image 57). Several prominent subcentimeter lymph nodes are seen along the left para-aortic retroperitoneum.  Prostate is enlarged measuring 6.0 x 6.7 x 9.3 cm. [Possible lipoma in gluteus maximus, or could be related to Lupron injection, discussed w urology, no action warranted.]  10/16/16: restaging:   - CT CAP: Interval increase in size of left external and common iliac chain as well as periaortic lymph nodes, worrisome for metastatic disease.   - Bone Scan: No evidence of osseous metastatic disease.  5/18: Abiraterone/prednisone    ROS: Nocturia: every 2-3 hours but drinks tea before bed, doesn't bother him.  No pain.  Fatigue.  Good appetite.  Has tolerated Lupron well.  Remainder of 10 system ROS negative.    PE  BP 140/67  - Pulse 56  - Temp 36.3 ??C (97.4 ??F) (Oral)  - Resp 18  - Ht 174.6 cm (5' 8.74)  - Wt 85.6 kg (188 lb 12.8 oz)  - SpO2 98%  - BMI 28.09 kg/m??   NAD  A+Ox3  No rash  No LEE  No cords  Neuro non focal  CTA  RRR  Abd NT ND    Current Outpatient Medications   Medication Sig Dispense Refill   ??? abiraterone (ZYTIGA) 250 mg Tab tablet Take 4 tablets (1,000 mg total) by mouth daily. 120 tablet 6   ??? amLODIPine-benazepril (LOTREL 5-20) 5-20 mg per  capsule Take 1 capsule by mouth daily.     ??? ascorbic acid, vitamin C, (VITAMIN C) 250 MG tablet Take 250 mg by mouth.     ??? aspirin (ECOTRIN) 81 MG tablet Take 81 mg by mouth.     ??? escitalopram oxalate (LEXAPRO) 10 MG tablet Take 1 tablet (10 mg total) by mouth daily. 90 tablet 1   ??? leuprolide (LUPRON) 22.5 mg injection Inject 22.5 mg into the muscle Every three (3) months.     ??? multivitamin (THERAGRAN) per tablet Take 1 tablet by mouth daily.     ??? predniSONE (DELTASONE) 5 MG tablet Take 2 tablets (10 mg total) by mouth daily. 60 tablet 6     No current facility-administered medications for this visit.      Allergies   Allergen Reactions   ??? Valium [Diazepam] Other (See Comments)     Hallucinations, altered mental status  Hallucinations, altered mental status           No visits with results within 2 Week(s) from this visit.   Latest known visit with results is:   Lab on 10/01/2017   Component Date Value Ref Range Status   ??? PSA 10/01/2017 5.72* 0.00 - 4.00 ng/mL Final   ??? Testosterone 10/01/2017 20* 179 - 756 ng/dL Final   ??? Sodium 16/06/9603 138  135 - 145 mmol/L Final   ??? Potassium 10/01/2017 3.9  3.5 - 5.0 mmol/L Final   ??? Chloride 10/01/2017 107  98 - 107 mmol/L Final   ??? CO2 10/01/2017 27.0  22.0 - 30.0 mmol/L Final   ??? BUN 10/01/2017 12  7 - 21 mg/dL Final   ??? Creatinine 10/01/2017 0.80  0.70 - 1.30 mg/dL Final   ??? BUN/Creatinine Ratio 10/01/2017 15   Final   ??? EGFR MDRD Non Af Amer 10/01/2017 >=60  >=60 mL/min/1.25m2 Final   ??? EGFR MDRD Af Amer 10/01/2017 >=60  >=60 mL/min/1.23m2 Final   ??? Anion Gap 10/01/2017 4* 9 - 15 mmol/L Final   ??? Glucose 10/01/2017 98  65 - 179 mg/dL Final   ??? Calcium 54/05/8118 9.4  8.5 - 10.2 mg/dL Final   ??? Albumin 14/78/2956 3.8  3.5 - 5.0 g/dL Final   ??? Total Protein 10/01/2017 6.0* 6.5 - 8.3 g/dL Final   ??? Total Bilirubin 10/01/2017 0.6  0.0 - 1.2 mg/dL Final   ??? AST 21/30/8657 15* 19 - 55 U/L Final   ??? ALT 10/01/2017 28  19 - 72 U/L Final   ??? Alkaline Phosphatase 10/01/2017 58  38 - 126 U/L Final       Lab Results   Component Value Date    PSA 5.72 (H) 10/01/2017    PSA 4.08 (H) 05/28/2017    PSA 5.22 (H) 02/26/2017    PSA 8.32 (H) 02/06/2017    PSA 9.60 (H) 11/03/2016    PSA 10.90 (H) 10/23/2016

## 2018-01-07 NOTE — Unmapped (Signed)
Addended by: Kirtland Bouchard on: 01/07/2018 10:49 AM     Modules accepted: Orders

## 2018-01-07 NOTE — Unmapped (Signed)
9528:  Labs drawn and sent for analysis.  Care provided by  Avie Echevaria, LPN

## 2018-01-11 MED FILL — PREDNISONE/5MG/TABS: PREDNISONE/5MG/TABS | 30 days supply | Qty: 60 | Fill #5

## 2018-01-11 MED FILL — ZYTIGA/250MG/TAB: ZYTIGA/250MG/TAB | 30 days supply | Qty: 120 | Fill #5

## 2018-01-28 ENCOUNTER — Encounter: Payer: Self-pay | Admitting: Family Medicine

## 2018-01-28 ENCOUNTER — Ambulatory Visit: Payer: Medicare HMO | Admitting: Family Medicine

## 2018-01-28 VITALS — BP 126/64 | HR 68 | Temp 98.1°F | Resp 16 | Ht 70.0 in | Wt 183.3 lb

## 2018-01-28 DIAGNOSIS — I440 Atrioventricular block, first degree: Secondary | ICD-10-CM

## 2018-01-28 DIAGNOSIS — I739 Peripheral vascular disease, unspecified: Secondary | ICD-10-CM | POA: Diagnosis not present

## 2018-01-28 DIAGNOSIS — I10 Essential (primary) hypertension: Secondary | ICD-10-CM

## 2018-01-28 DIAGNOSIS — I700 Atherosclerosis of aorta: Secondary | ICD-10-CM

## 2018-01-28 DIAGNOSIS — C775 Secondary and unspecified malignant neoplasm of intrapelvic lymph nodes: Secondary | ICD-10-CM

## 2018-01-28 DIAGNOSIS — D692 Other nonthrombocytopenic purpura: Secondary | ICD-10-CM | POA: Diagnosis not present

## 2018-01-28 DIAGNOSIS — B356 Tinea cruris: Secondary | ICD-10-CM | POA: Diagnosis not present

## 2018-01-28 DIAGNOSIS — F32A Depression, unspecified: Secondary | ICD-10-CM

## 2018-01-28 DIAGNOSIS — F320 Major depressive disorder, single episode, mild: Secondary | ICD-10-CM

## 2018-01-28 DIAGNOSIS — C61 Malignant neoplasm of prostate: Secondary | ICD-10-CM

## 2018-01-28 DIAGNOSIS — R69 Illness, unspecified: Secondary | ICD-10-CM | POA: Diagnosis not present

## 2018-01-28 MED ORDER — ESCITALOPRAM OXALATE 10 MG PO TABS: 10.0000 mg | ORAL_TABLET | Freq: Every day | ORAL | 1 refills | Status: DC

## 2018-01-28 MED ORDER — AMLODIPINE BESY-BENAZEPRIL HCL 5-20 MG PO CAPS: 1.0000 | ORAL_CAPSULE | Freq: Every day | ORAL | 1 refills | Status: DC

## 2018-01-28 MED ORDER — CLOTRIMAZOLE-BETAMETHASONE 1-0.05 % EX CREA: 1.0000 "application " | TOPICAL_CREAM | Freq: Two times a day (BID) | CUTANEOUS | 0 refills | Status: DC

## 2018-01-28 MED ORDER — KETOCONAZOLE 2 % EX CREA: 1.0000 "application " | TOPICAL_CREAM | Freq: Two times a day (BID) | CUTANEOUS | 0 refills | Status: DC

## 2018-01-28 NOTE — Progress Notes (Addendum)
Name: Kevin Donovan   MRN: [id number]    DOB: [birth year]   Date:01/28/2018       Progress Note  Subjective  Chief Complaint  Chief Complaint  Patient presents with  . Medication Refill  . Hypertension  . Depression  . Prostate Cancer    HPI  HTN: he was going to start a research medication for prostate cancer and bp was elevated during [facility] visit, so he could not start therapy,but on May 5th 2018  he started a different medication and has been doing well on it. BP at home also wellc controlled around 130/70's No chest pain or palpitation.   Prostate Cancer: he was diagnosed in 2008 in 2018  diagnosed with metastatic cancer, and resume therapy May 2018, doing well with regiment, no side effects, mild weight loss but states it always fluctuates for him  Calcification abdominal aorta: found on CT abdomen, he has metastatic prostate cancer, continue aspirin , he does not want to take statins at this time. Unchanged. Reviewed labs done 06/2017  Depression: he states that since he started on medication ( Lexapro ) he has not noticed any difference in his behavior, however his wife has noticed a great improvement in his mood. He does not want to increase dose at this time.Unchanged   Bradycardia on EKG: done at [facility], but no change since 2014.No symptoms.   Tobacco: he is not ready to quit smoking yet, he is smoking about 3/4 of pack daily.  instead to 1 pack. He has a daily cough sometimes productive, no wheezing and no SOB, discussed spirometry and he refuses   Claudication: he states he needs to stop after he walks for a prolonged period of time, he states it depends on the surface and how fast he is walking, discussed neurogenic versus vascular problems, he is not interested in having any studies done for it Still the same. Does not want to stop smoking or have study done.   Tinea Cruris: large rash inner thighs, uses clotrimazole and betamethasone and needs a refills  Patient  Active Problem List   Diagnosis Date Noted  . Senile purpura ([place]) 01/28/2018  . Hot flashes 05/28/2017  . Fatigue 05/28/2017  . First degree AV block 11/28/2016  . Bradycardia on ECG 11/14/2016  . Situational depression 10/31/2016  . Calcification of abdominal aorta (HCC) 10/31/2016  . Degenerative disc disease, lumbar 10/31/2016  . Prostate cancer metastatic to intrapelvic lymph node ([place]) 10/23/2016  . Malignant neoplasm of prostate ([place]) 08/27/2012  . Enlarged prostate with lower urinary tract symptoms (LUTS) 08/27/2012    Past Surgical History:  Procedure Laterality Date  . APPENDECTOMY  2011  . COLONOSCOPY  2005    Family History  Problem Relation Age of Onset  . Lupus Mother   . Heart disease Mother        CHF  . Heart disease Father     Social History   Socioeconomic History  . Marital status: Married    Spouse name: Not on file  . Number of children: Not on file  . Years of education: Not on file  . Highest education level: Not on file  Occupational History  . Not on file  Social Needs  . Financial resource strain: Not on file  . Food insecurity:    Worry: Not on file    Inability: Not on file  . Transportation needs:    Medical: Not on file    Non-medical: Not on file  Tobacco  Use  . Smoking status: Current Every Day Smoker    Packs/day: 0.75    Years: 72.00    Pack years: 54.00    Types: Cigarettes    Start date: 01/29/1944  . Smokeless tobacco: Never Used  Substance and Sexual Activity  . Alcohol use: No  . Drug use: No  . Sexual activity: Yes    Partners: Female  Lifestyle  . Physical activity:    Days per week: Not on file    Minutes per session: Not on file  . Stress: Not on file  Relationships  . Social connections:    Talks on phone: Not on file    Gets together: Not on file    Attends religious service: Not on file    Active member of club or organization: Not on file    Attends meetings of clubs or organizations: Not on file     Relationship status: Not on file  . Intimate partner violence:    Fear of current or ex partner: Not on file    Emotionally abused: Not on file    Physically abused: Not on file    Forced sexual activity: Not on file  Other Topics Concern  . Not on file  Social History Narrative  . Not on file     Current Outpatient Medications:  .  abiraterone Acetate (ZYTIGA) 250 MG tablet, Take 1,000 mg by mouth daily. , Disp: , Rfl:  .  amLODipine-benazepril (LOTREL) 5-20 MG capsule, Take 1 capsule daily by mouth., Disp: 90 capsule, Rfl: 1 .  aspirin EC 81 MG tablet, Take 81 mg by mouth., Disp: , Rfl:  .  escitalopram (LEXAPRO) 10 MG tablet, Take 1 tablet (10 mg total) daily by mouth., Disp: 90 tablet, Rfl: 1 .  leuprolide (LUPRON) 22.5 MG injection, Inject 22.5 mg every 3 (three) months into the muscle. , Disp: , Rfl:  .  Multiple Vitamin (MULTI-VITAMINS) TABS, Take by mouth., Disp: , Rfl:  .  predniSONE (DELTASONE) 5 MG tablet, Take 10 mg daily with breakfast by mouth. , Disp: , Rfl:  .  vitamin C (ASCORBIC ACID) 250 MG tablet, Take 250 mg by mouth daily., Disp: , Rfl:  .  aspirin 81 MG tablet, Take 1 tablet by mouth daily., Disp: , Rfl:  .  fluticasone (FLONASE) 50 MCG/ACT nasal spray, Place 2 sprays into both nostrils daily. (Patient not taking: Reported on 01/28/2018), Disp: 16 g, Rfl: 0  Allergies  Allergen Reactions  . Diazepam Other (See Comments)    Hallucinations, altered mental status     ROS  Constitutional: Negative for fever, positive for mild weight change.  Respiratory: Positive for cough but no  shortness of breath.   Cardiovascular: Negative for chest pain or palpitations.  Gastrointestinal: Negative for abdominal pain, no bowel changes.  Musculoskeletal: Negative for gait problem or joint swelling.  Skin: positive  for rash.  Neurological: Negative for dizziness or headache.  No other specific complaints in a complete review of systems (except as listed in HPI  above).  Objective  Vitals:   01/28/18 0940  BP: 126/64  Pulse: 68  Resp: 16  Temp: 98.1 F (36.7 C)  TempSrc: Oral  SpO2: 99%  Weight: 183 lb 4.8 oz (83.1 kg)  Height: 5\' 10"  (1.778 m)    Body mass index is 26.3 kg/m.  Physical Exam  Constitutional: Patient appears well-developed and well-nourished.  No distress.  HEENT: head atraumatic, normocephalic, pupils equal and reactive to light,neck supple, throat  within normal limits Cardiovascular: Normal rate, regular rhythm and normal heart sounds.  No murmur heard. 1 plus  BLE edema. Pulmonary/Chest: Effort normal and breath sounds normal. No respiratory distress. Abdominal: Soft.  There is no tenderness. Skin: rash on inner thighs and groin, iwell demarcated  Psychiatric: Patient has a normal mood and affect. behavior is normal. Judgment and thought content normal.  PHQ2/9: Depression screen [facility] 2/9 01/28/2018 10/31/2016  Decreased Interest 0 0  Down, Depressed, Hopeless 0 0  PHQ - 2 Score 0 0     Fall Risk: Fall Risk  01/28/2018 07/31/2017 07/07/2017 10/31/2016  Falls in the past year? No No No No     Functional Status Survey: Is the patient deaf or have difficulty hearing?: Yes Does the patient have difficulty seeing, even when wearing glasses/contacts?: No Does the patient have difficulty concentrating, remembering, or making decisions?: No Does the patient have difficulty walking or climbing stairs?: No Does the patient have difficulty dressing or bathing?: No Does the patient have difficulty doing errands alone such as visiting a doctor's office or shopping?: No    Assessment & Plan  1. Prostate cancer metastatic to intrapelvic lymph node ([place])  Continue follow up with [facility]  2. Claudication ([place])   3. Senile purpura (HCC)  Stable   4. Essential hypertension  - amLODipine-benazepril (LOTREL) 5-20 MG capsule; Take 1 capsule by mouth daily.  Dispense: 90 capsule; Refill: 1  5. Mild depression (HCC)  -  escitalopram (LEXAPRO) 10 MG tablet; Take 1 tablet (10 mg total) by mouth daily.  Dispense: 90 tablet; Refill: 1  6. Calcification of abdominal aorta (HCC)  Refuses statin therapy   7. First degree AV block  stable  8. Tinea cruris  - ketoconazole (NIZORAL) 2 % cream; Apply 1 application topically 2 (two) times daily.  Dispense: 60 g; Refill: 0 - clotrimazole-betamethasone (LOTRISONE) cream; Apply 1 application topically 2 (two) times daily.  Dispense: 45 g; Refill: 0

## 2018-02-04 NOTE — Unmapped (Signed)
[facility] Refill Coordination Note  Specialty Medication(s): Zytiga 250mg   Additional Medications shipped: prednisone 5mg     Tsuneo Faison, DOB: 09/02/1936  Phone: [phone] (home) , Alternate phone contact: N/A  Phone or address changes today?: No  All above HIPAA information was verified with patient's family member.  Shipping Address: [address]  [place] [address]   Insurance changes? No    Completed refill call assessment today to schedule patient's medication shipment from the [facility] [phone]).      Confirmed the medication and dosage are correct and have not changed: Yes, regimen is correct and unchanged.    Confirmed patient started or stopped the following medications in the past month:  No, there are no changes reported at this time.    Are you tolerating your medication?:  Goldman reports tolerating the medication.    ADHERENCE    (Below is required for Medicare Part B or Transplant patients only - per drug):   How many tablets were dispensed last month: 120  Patient currently has 56 remaining.    Did you miss any doses in the past 4 weeks? No missed doses reported.    FINANCIAL/SHIPPING    Delivery Scheduled: Yes, Expected medication delivery date: 02/10/2018     The patient will receive an FSI print out for each medication shipped and additional FDA Medication Guides as required.  Patient education from [place] or Robet Leu may also be included in the shipment    Elier did not have any additional questions at this time.    Delivery address validated in FSI scheduling system: Yes, address listed in FSI is correct.    We will follow up with patient monthly for standard refill processing and delivery.      Thank you,  Breck Coons Shared [facility] Specialty Pharmacist

## 2018-02-09 MED FILL — ZYTIGA/250MG/TAB: ZYTIGA/250MG/TAB | 30 days supply | Qty: 120 | Fill #6

## 2018-02-09 MED FILL — PREDNISONE/5MG/TABS: PREDNISONE/5MG/TABS | 30 days supply | Qty: 60 | Fill #6

## 2018-03-08 NOTE — Unmapped (Signed)
Please refill medications, if appropriate

## 2018-03-09 MED ORDER — PREDNISONE 5 MG TABLET: 10 mg | tablet | Freq: Every day | 6 refills | 0 days | Status: AC

## 2018-03-09 MED ORDER — PREDNISONE 5 MG TABLET: 10 mg | tablet | 6 refills | 0 days

## 2018-03-09 MED ORDER — PREDNISONE 5 MG TABLET
ORAL_TABLET | Freq: Every day | ORAL | 6 refills | 0.00000 days | Status: CP
Start: 2018-03-09 — End: 2018-03-09

## 2018-03-09 MED ORDER — ABIRATERONE 250 MG TABLET: 1000 mg | each | 6 refills | 0 days | Status: AC

## 2018-03-09 MED ORDER — ABIRATERONE 250 MG TABLET: 1000 mg | tablet | Freq: Every day | 6 refills | 0 days | Status: AC

## 2018-03-09 MED ORDER — ABIRATERONE 250 MG TABLET
ORAL_TABLET | Freq: Every day | ORAL | 6 refills | 0.00000 days | Status: CP
Start: 2018-03-09 — End: 2018-03-09

## 2018-03-09 NOTE — Unmapped (Addendum)
[facility] Refill Coordination Note  Specialty Medication(s): Zytiga 250mg   Additional Medications shipped: Prednisone 5mg     John Watkins, DOB: 1936-05-06  Phone: [phone] (home) , Alternate phone contact: N/A  Phone or address changes today?: No  All above HIPAA information was verified with patient's caregiver.  (wife)  Shipping Address: [address] [place] [address]   Insurance changes? No    Completed refill call assessment today to schedule patient's medication shipment from the [facility] [phone]).      Confirmed the medication and dosage are correct and have not changed: Yes, regimen is correct and unchanged.   **ADDENDUM: 03/11/18, due to costs clinic ok'ed change to generic abiraterone.  Pt's wife aware and expecting generic for this shipment**    Confirmed patient started or stopped the following medications in the past month:  No, there are no changes reported at this time.    Are you tolerating your medication?:  John Watkins reports tolerating the medication.    ADHERENCE  Did you miss any doses in the past 4 weeks? No missed doses reported.    FINANCIAL/SHIPPING    Delivery Scheduled: Yes, Expected medication delivery date: 03/15/2018.  However, Rx request for refills was sent to the provider as there are none remaining.     The patient will receive an FSI print out for each medication shipped and additional FDA Medication Guides as required.  Patient education from [place] or Robet Leu may also be included in the shipment    John Watkins did not have any additional questions at this time.    Delivery address validated in FSI scheduling system: Yes, address listed in FSI is correct.    We will follow up with patient monthly for standard refill processing and delivery.      Thank you,  John Watkins  John Watkins   [facility] Specialty Pharmacist

## 2018-03-09 NOTE — Unmapped (Signed)
-----   Message from Wylene Simmer sent at 03/09/2018  3:39 PM EDT -----  Regarding: RE: Roosvelt Maser RX  Thanks! Spoke with the patient's wife and they are aware that they will be receiving generic.    ----- Message -----  From: Daylene Posey, CPP  Sent: 03/09/2018   3:35 PM  To: Elijah Birk, RN, #  Subject: RE: Waynard Edwards                                    Yes we are good with generic  ----- Message -----  From: Wylene Simmer  Sent: 03/09/2018   3:16 PM  To: Elijah Birk, RN, #  Subject: Waynard Edwards                                        Good afternoon,    Our records show that this patient has been on brand Zytiga, and we received a prescription for the generic. Concha Pyo has a copay of $574.88, and generic abiraterone has a copay of $441.09. In an effort to save save the patient a little money, can we proceed with the generic or would you like to keep it as brand Zytiga?    Delivery has already been set up and the patient is expecting Zytiga, however if you would like to make the switch to generic, I would be more than happy to get in touch with the patient to provide an update.    Thank you!    Gaynell Face, CPhT MPH-c  Pharmacy Technician Specialist - Triage  [facility]  (P): [phone]

## 2018-03-12 MED FILL — ABIRATERONE ACETATE/250MG/TABS: ABIRATERONE ACETATE/250MG/TABS | 30 days supply | Qty: 120 | Fill #0

## 2018-03-12 MED FILL — PREDNISONE/5MG/TABS: PREDNISONE/5MG/TABS | 30 days supply | Qty: 60 | Fill #0

## 2018-04-08 ENCOUNTER — Ambulatory Visit: Admit: 2018-04-08 | Discharge: 2018-04-09 | Payer: MEDICARE

## 2018-04-08 DIAGNOSIS — Z7952 Long term (current) use of systemic steroids: Principal | ICD-10-CM

## 2018-04-08 DIAGNOSIS — M8580 Other specified disorders of bone density and structure, unspecified site: Secondary | ICD-10-CM

## 2018-04-08 DIAGNOSIS — C61 Malignant neoplasm of prostate: Secondary | ICD-10-CM

## 2018-04-08 DIAGNOSIS — M85852 Other specified disorders of bone density and structure, left thigh: Secondary | ICD-10-CM | POA: Diagnosis not present

## 2018-04-08 MED ORDER — ABIRATERONE 250 MG TABLET
99 refills | 0 days
Start: 2018-04-08 — End: 2018-04-08

## 2018-04-08 MED ORDER — PREDNISONE 5 MG TABLET: tablet | 11 refills | 0 days

## 2018-04-08 MED ORDER — PREDNISONE 5 MG TABLET
ORAL_TABLET | ORAL | 11 refills | 0.00000 days | Status: CP
Start: 2018-04-08 — End: 2018-04-08
  Filled 2018-05-12: qty 60, 30d supply, fill #0

## 2018-04-08 MED ORDER — ZYTIGA 250 MG TABLET
ORAL_TABLET | PRN refills | 0 days | Status: CP
Start: 2018-04-08 — End: 2018-04-15
  Filled 2018-05-12: qty 120, 30d supply, fill #0

## 2018-04-09 NOTE — Unmapped (Signed)
[facility] Refill Coordination Note  Specialty Medication(s): abiraterone  Additional Medications shipped: prednisone    Cornel Werber, DOB: 1936-03-20  Phone: [phone] (home) , Alternate phone contact: N/A  Phone or address changes today?: No  All above HIPAA information was verified with patient's family member.  Shipping Address: [address]  [place] [address]   Insurance changes? No    Completed refill call assessment today to schedule patient's medication shipment from the [facility] [phone]).      Confirmed the medication and dosage are correct and have not changed: Yes, regimen is correct and unchanged.    Confirmed patient started or stopped the following medications in the past month:  No, there are no changes reported at this time.    Are you tolerating your medication?:  Tahj reports tolerating the medication.    ADHERENCE    Did you miss any doses in the past 4 weeks? No missed doses reported.    FINANCIAL/SHIPPING    Delivery Scheduled: Yes, Expected medication delivery date: Tues, July 23 with courier     The patient will receive an FSI print out for each medication shipped and additional FDA Medication Guides as required.  Patient education from [place] or Robet Leu may also be included in the shipment    Jahron did not have any additional questions at this time.    Delivery address validated in FSI scheduling system: Yes, address listed in FSI is correct.    We will follow up with patient monthly for standard refill processing and delivery.      Thank you,  Tawanna Solo Shared [facility] Specialty Pharmacist

## 2018-04-13 MED FILL — ABIRATERONE ACETATE/250MG/TABS: ABIRATERONE ACETATE/250MG/TABS | 30 days supply | Qty: 120 | Fill #1

## 2018-04-13 MED FILL — PREDNISONE/5MG/TABS: PREDNISONE/5MG/TABS | 30 days supply | Qty: 60 | Fill #1

## 2018-04-15 ENCOUNTER — Ambulatory Visit: Admit: 2018-04-15 | Discharge: 2018-04-16 | Payer: MEDICARE | Attending: Medical Oncology | Primary: Medical Oncology

## 2018-04-15 ENCOUNTER — Other Ambulatory Visit: Admit: 2018-04-15 | Discharge: 2018-04-16 | Payer: MEDICARE

## 2018-04-15 DIAGNOSIS — M8580 Other specified disorders of bone density and structure, unspecified site: Secondary | ICD-10-CM

## 2018-04-15 DIAGNOSIS — C61 Malignant neoplasm of prostate: Principal | ICD-10-CM

## 2018-04-15 DIAGNOSIS — C775 Secondary and unspecified malignant neoplasm of intrapelvic lymph nodes: Secondary | ICD-10-CM

## 2018-04-15 DIAGNOSIS — F419 Anxiety disorder, unspecified: Secondary | ICD-10-CM | POA: Diagnosis not present

## 2018-04-15 DIAGNOSIS — R69 Illness, unspecified: Secondary | ICD-10-CM | POA: Diagnosis not present

## 2018-04-15 DIAGNOSIS — Z7982 Long term (current) use of aspirin: Secondary | ICD-10-CM | POA: Diagnosis not present

## 2018-04-15 DIAGNOSIS — Z7952 Long term (current) use of systemic steroids: Secondary | ICD-10-CM | POA: Diagnosis not present

## 2018-04-15 DIAGNOSIS — R351 Nocturia: Secondary | ICD-10-CM | POA: Diagnosis not present

## 2018-04-15 DIAGNOSIS — Z79818 Long term (current) use of other agents affecting estrogen receptors and estrogen levels: Secondary | ICD-10-CM | POA: Diagnosis not present

## 2018-04-15 LAB — COMPREHENSIVE METABOLIC PANEL
ALBUMIN: 3.3 g/dL — ABNORMAL LOW (ref 3.5–5.0)
ALKALINE PHOSPHATASE: 59 U/L (ref 38–126)
ALT (SGPT): 17 U/L — ABNORMAL LOW (ref 19–72)
ANION GAP: 4 mmol/L — ABNORMAL LOW (ref 9–15)
AST (SGOT): 17 U/L — ABNORMAL LOW (ref 19–55)
BILIRUBIN TOTAL: 0.4 mg/dL (ref 0.0–1.2)
BLOOD UREA NITROGEN: 13 mg/dL (ref 7–21)
BUN / CREAT RATIO: 15
CALCIUM: 9.1 mg/dL (ref 8.5–10.2)
CO2: 27 mmol/L (ref 22.0–30.0)
CREATININE: 0.86 mg/dL (ref 0.70–1.30)
EGFR CKD-EPI AA MALE: >90 mL/min/{1.73_m2} (ref >=60)
EGFR CKD-EPI NON-AA MALE: 82 mL/min/{1.73_m2} (ref >=60)
GLUCOSE RANDOM: 111 mg/dL (ref 65–179)
POTASSIUM: 4 mmol/L (ref 3.5–5.0)
PROTEIN TOTAL: 5.8 g/dL — ABNORMAL LOW (ref 6.5–8.3)
SODIUM: 138 mmol/L (ref 135–145)

## 2018-04-15 LAB — ALT (SGPT): Alanine aminotransferase:CCnc:Pt:Ser/Plas:Qn:: 17 — ABNORMAL LOW

## 2018-04-15 LAB — TESTOSTERONE TOTAL: Testosterone:MCnc:Pt:Ser/Plas:Qn:: 54 — ABNORMAL LOW

## 2018-04-15 LAB — PROSTATE SPECIFIC ANTIGEN: Prostate specific Ag:MCnc:Pt:Ser/Plas:Qn:: 10.6 — ABNORMAL HIGH

## 2018-04-15 MED ORDER — ALENDRONATE 70 MG TABLET
ORAL_TABLET | ORAL | 3 refills | 0 days | Status: CP
Start: 2018-04-15 — End: 2019-04-15

## 2018-04-15 NOTE — Unmapped (Signed)
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  I previously asked Radiation Oncology to weigh in about potential RT to the lymph nodes, and they felt this would not be beneficial and would confer toxicity without likely long-term disease control.  He has been asymptomatic.  He has had RT to the prostate in the past.      Plan:  - Continue Lupron every three months (04/15/18).  - Continue abiraterone/prednisone.  - Fatigue on abiraterone - he does not want to try ritalin currently but might consider in the future.  - Follow PSA.  - Follow LFTs - today pending.  - Blood pressure control with his PCP.  - Nocturia: persistent, does not want Flomax.   - Anxiety: On Lexapro - Pharmacy ran interactions for both olaparib and abiraterone.  - Smoking: He understands risks, and the [place] smoking cessation program here has been meeting with him for counseling.  - Bone density: Checked again in 6/19 and although only mildly low, had 10% loss in bone density from 2016-2019 on Lupron and now prednisone.  Starting Fosamax, calcium/D.  Follow up for Lupron and monitoring on abiraterone.    -------------------------------------------------------    Referring physician:  Riki Altes, MD [facility])  Other physician:  Irven Easterly, MD    CC: Prostate cancer to lymph nodes and possibly bone s/p IMRT to prostate, with elevated PSA, on Lupron    Current therapy: Lupron    HPI:  2010: IMRT to prostate for T1c prostate adenocarcinoma, biopsy with up to Gleason 4+3=7.  Subsequent PSA rise with negative saturation biopsy on 11/17/12.  - 04/05/13: 8.3  - 08/29/13: 12.8  - 02/09/14: 13.3  10/2013: MRI with enlarged left obturator, iliac and para-aortic nodes up to 3 cm consistent with metastatic disease. Bilateral ischia and L pelvis with bony areas <20mm of unclear etiology (bone islands vs. metastases).  10/2013: Lupron started  Subsequent PSA:  - 05/01/14: 1.4  - 08/02/14: 1.4  - 11/03/14: 1.7  01/18/16: Bone scan: no metastases.  01/18/16: CT A/P: 2.6 cm left external iliac chain hyperenhancing node (series 6, image 57). Several prominent subcentimeter lymph nodes are seen along the left para-aortic retroperitoneum.  Prostate is enlarged measuring 6.0 x 6.7 x 9.3 cm. [Possible lipoma in gluteus maximus, or could be related to Lupron injection, discussed w urology, no action warranted.]  10/16/16: restaging:   - CT CAP: Interval increase in size of left external and common iliac chain as well as periaortic lymph nodes, worrisome for metastatic disease.   - Bone Scan: No evidence of osseous metastatic disease.  5/18: Abiraterone/prednisone  6/19: BMD (c/w 2016): Lumbar spine: ??Normal bone density. ??The measurement has decreased significantly since prior study.  Left proximal femur: Mildly low bone density. ??The measurement has decreased significantly since prior study.    ROS: Nocturia: every 2-3 hours but drinks tea before bed, doesn't bother him.  No pain.  Fatigue.  Good appetite.  Has tolerated Lupron well.  Remainder of 10 system ROS negative.    PE  BP 138/64  - Pulse 55  - Temp 36.9 ??C (98.5 ??F) (Oral)  - Resp 18  - Ht 174.6 cm (5' 8.74)  - Wt 79.5 kg (175 lb 4.8 oz)  - SpO2 100%  - BMI 26.08 kg/m??   NAD  A+Ox3  No rash  No LEE  No cords  Neuro non focal  CTA  RRR  Abd NT ND    Current Outpatient Medications   Medication Sig Dispense Refill   ??? abiraterone (ZYTIGA) 250 mg Tab tablet Take 4 tablets (1,000 mg total) by mouth daily. 120 tablet 6   ??? amLODIPine-benazepril (LOTREL 5-20) 5-20 mg per capsule Take 1 capsule by mouth daily.     ??? ascorbic acid, vitamin C, (VITAMIN C) 250 MG tablet Take 250 mg by mouth.     ??? aspirin (ECOTRIN) 81 MG tablet Take 81 mg by mouth.     ??? escitalopram oxalate (LEXAPRO) 10 MG tablet Take 1 tablet (10 mg total) by mouth daily. 90 tablet 1   ??? leuprolide (LUPRON) 22.5 mg injection Inject 22.5 mg into the muscle Every three (3) months.     ??? multivitamin (THERAGRAN) per tablet Take 1 tablet by mouth daily.     ??? predniSONE (DELTASONE) 5 MG tablet TAKE 2 TABLETS (10MG ) BY MOUTH ONCE DAILY 60 tablet 11   ??? predniSONE (DELTASONE) 5 MG tablet Take 2 tablets (10 mg total) by mouth daily. 60 tablet 6   ??? ZYTIGA 250 mg Tab tablet TAKE 4 TABLETS (1000MG ) BY MOUTH ONCE DAILY 120 tablet PRN     No current facility-administered medications for this visit.      Allergies   Allergen Reactions   ??? Valium [Diazepam] Other (See Comments)     Hallucinations, altered mental status  Hallucinations, altered mental status           No visits with results within 2 Week(s) from this visit.   Latest known visit with results is:   Lab on 01/07/2018   Component Date Value Ref Range Status   ??? PSA 01/07/2018 7.80* 0.00 - 4.00 ng/mL Final   ??? Testosterone 01/07/2018 33* 179 - 756 ng/dL Final   ??? Sodium 65/78/4696 139  135 - 145 mmol/L Final   ??? Potassium 01/07/2018 3.8  3.5 - 5.0 mmol/L Final   ??? Chloride 01/07/2018 107  98 - 107 mmol/L Final   ??? CO2 01/07/2018 27.0  22.0 - 30.0 mmol/L Final   ??? BUN 01/07/2018 17  7 - 21 mg/dL Final   ??? Creatinine 01/07/2018 0.85  0.70 - 1.30 mg/dL Final   ??? BUN/Creatinine Ratio 01/07/2018 20   Final   ??? EGFR MDRD Non Af Amer 01/07/2018 >=60  >=60 mL/min/1.87m2 Final   ??? EGFR MDRD Af Amer 01/07/2018 >=60  >=60 mL/min/1.4m2 Final   ??? Anion Gap 01/07/2018 5* 9 - 15 mmol/L Final   ??? Glucose 01/07/2018 85  65 - 179 mg/dL Final   ??? Calcium 29/52/8413 9.4  8.5 - 10.2 mg/dL Final   ??? Albumin 24/40/1027 3.6  3.5 - 5.0 g/dL Final   ??? Total Protein 01/07/2018 6.2* 6.5 - 8.3 g/dL Final   ??? Total Bilirubin 01/07/2018 0.5  0.0 - 1.2 mg/dL Final   ??? AST 25/36/6440 17* 19 - 55 U/L Final   ??? ALT 01/07/2018 24  19 - 72 U/L Final   ??? Alkaline Phosphatase 01/07/2018 58  38 - 126 U/L Final       Lab Results   Component Value Date    PSA 7.80 (H) 01/07/2018    PSA 5.72 (H) 10/01/2017    PSA 4.08 (H) 05/28/2017 PSA 5.22 (H) 02/26/2017    PSA 8.32 (H) 02/06/2017    PSA 9.60 (H) 11/03/2016

## 2018-04-15 NOTE — Unmapped (Signed)
1610:  Labs drawn and sent for analysis.  Care provided by  Will Bonnet.

## 2018-04-15 NOTE — Unmapped (Addendum)
Clinical Pharmacist Practitioner: Genitourinary Clinic    Oral Chemotherapy Follow-Up    Assessment and recommendations:  1. Abiraterone monitoring and side effect management: patient reports that he is feeling well today, and that he is having no issues with taking Zytiga. Patient's BP today in clinic was 138/64, LFTs and K are WNL.  -continue zytiga 1000 mg daily + prednisone 10 mg daily, and lupron    Follow- up: With Dr. Vernell Barrier in clinic on 07/15/2018    ______________________________________________________________________    Oral Chemotherapy: Zytiga 1000 mg daily + prednisone 10 mg      Start date: 01/2017    HPI: Castrate-resistant metastatic prostate cancer on Zytiga + Lupron.     Interim History: Mr. Giammarino reports feeling well today. He has no complaints with the Zytiga. His co-pay has gone down since we switched him to generic abiraterone, although he said he feels like he has taken an Catering manager but does not last long.    Adherence: He takes 4 tablets a day without food. He has not missed any doses. Takes 10 mg of prednisone at night (no sleep issues)    Drug-Drug Interactions: none     Medications:  Current Outpatient Medications   Medication Sig Dispense Refill   ??? abiraterone (ZYTIGA) 250 mg Tab tablet Take 4 tablets (1,000 mg total) by mouth daily. 120 tablet 6   ??? amLODIPine-benazepril (LOTREL 5-20) 5-20 mg per capsule Take 1 capsule by mouth daily.     ??? ascorbic acid, vitamin C, (VITAMIN C) 250 MG tablet Take 250 mg by mouth.     ??? aspirin (ECOTRIN) 81 MG tablet Take 81 mg by mouth.     ??? escitalopram oxalate (LEXAPRO) 10 MG tablet Take 1 tablet (10 mg total) by mouth daily. 90 tablet 1   ??? leuprolide (LUPRON) 22.5 mg injection Inject 22.5 mg into the muscle Every three (3) months.     ??? multivitamin (THERAGRAN) per tablet Take 1 tablet by mouth daily.     ??? predniSONE (DELTASONE) 5 MG tablet Take 2 tablets (10 mg total) by mouth daily. 60 tablet 6     No current facility-administered medications for this visit.      I spent 10 minutes with Mr.Sturkey in direct patient care.     Laverna Peace PharmD, BCOP, CPP  Hematology/Oncology Pharmacist  P: [phone]

## 2018-04-15 NOTE — Unmapped (Signed)
Addended by: Kirtland Bouchard on: 04/15/2018 10:19 AM     Modules accepted: Orders

## 2018-04-15 NOTE — Unmapped (Addendum)
Intrusruction  1. Start calcium carbonate 1000 mg daily and vitamin D 400 IU  2. Start fosamax once weekly

## 2018-04-30 NOTE — Unmapped (Signed)
[facility] Refill Coordination Note    Specialty Medication(s) to be Shipped:   ABIRATERONE    Other medication(s) to be shipped: PREDNISONE     [name], DOB: 01-May-1936  Phone: [phone] (home)   Shipping Address: [address]  [facility] [place] [place] [address]    All above HIPAA information was verified with patient.     Completed refill call assessment today to schedule patient's medication shipment from the [facility] [phone]).       Specialty medication(s) and dose(s) confirmed: Regimen is correct and unchanged.   Changes to medications: Deontrae reports no changes reported at this time.  Changes to insurance: No  Questions for the pharmacist: No    The patient will receive a drug information handout for each medication shipped and additional FDA Medication Guides as required.      DISEASE/MEDICATION-SPECIFIC INFORMATION        N/A    ADHERENCE     Medication Adherence    Adherence tools used:  medication list   Other adherence tool:  routine   Support network for adherence:  family member              MEDICARE PART B DOCUMENTATION     Not Applicable    SHIPPING     Shipping address confirmed in Epic.     Delivery Scheduled: Yes, Expected medication delivery date: 05/12/18 via UPS or courier. (VIA WORRY FREE DELIVERY)    Wylene Simmer   [facility] Specialty Technician

## 2018-05-01 ENCOUNTER — Other Ambulatory Visit: Payer: Self-pay | Admitting: Family Medicine

## 2018-05-01 DIAGNOSIS — F32A Depression, unspecified: Secondary | ICD-10-CM

## 2018-05-01 DIAGNOSIS — F320 Major depressive disorder, single episode, mild: Secondary | ICD-10-CM

## 2018-05-12 MED FILL — ABIRATERONE 250 MG TABLET: 30 days supply | Qty: 120 | Fill #0 | Status: AC

## 2018-05-12 MED FILL — PREDNISONE 5 MG TABLET: 30 days supply | Qty: 60 | Fill #0 | Status: AC

## 2018-06-09 NOTE — Unmapped (Signed)
[facility] Refill Coordination Note    Specialty Medication(s) to be Shipped: abiraterone      Other medication(s) to be shipped: prednisone     John Watkins, DOB: Nov 06, 1935  Phone: [phone] (home)   Shipping Address: [address]  [facility] [place] [address]    All above HIPAA information was verified with patient's family member.     Completed refill call assessment today to schedule patient's medication shipment from the [facility] [phone]).       Specialty medication(s) and dose(s) confirmed: Regimen is correct and unchanged.   Changes to medications: Kishawn reports no changes reported at this time.  Changes to insurance: No  Questions for the pharmacist: No    The patient will receive a drug information handout for each medication shipped and additional FDA Medication Guides as required.      DISEASE/MEDICATION-SPECIFIC INFORMATION        GU Oncology    ADHERENCE     Medication Adherence    Patient reported X missed doses in the last month:  0  Specialty Medication:  abiraterone  Patient is on additional specialty medications:  No  Informant:  spouse  Adherence tools used:  medication list   Other adherence tool:  routine   Support network for adherence:  family member  Confirmed plan for next specialty medication refill:  delivery by pharmacy          Refill Coordination    Has the Patients' Contact Information Changed:  No  Is the Shipping Address Different:  No         MEDICARE PART B DOCUMENTATION     abiraterone: Patient has a few on hand.    SHIPPING     Shipping address confirmed in Epic.     Delivery Scheduled: Yes, Expected medication delivery date: 06/11/2018 via UPS or courier.     Micole Delehanty [employer]   [facility] Specialty Technician

## 2018-06-10 MED FILL — ABIRATERONE 250 MG TABLET: ORAL | 30 days supply | Qty: 120 | Fill #1

## 2018-06-10 MED FILL — ABIRATERONE 250 MG TABLET: 30 days supply | Qty: 120 | Fill #1 | Status: AC

## 2018-06-10 MED FILL — PREDNISONE 5 MG TABLET: 30 days supply | Qty: 60 | Fill #1 | Status: AC

## 2018-06-10 MED FILL — PREDNISONE 5 MG TABLET: ORAL | 30 days supply | Qty: 60 | Fill #1

## 2018-06-30 ENCOUNTER — Other Ambulatory Visit: Payer: Self-pay | Admitting: Family Medicine

## 2018-06-30 ENCOUNTER — Ambulatory Visit (INDEPENDENT_AMBULATORY_CARE_PROVIDER_SITE_OTHER): Payer: Medicare HMO | Admitting: Emergency Medicine

## 2018-06-30 DIAGNOSIS — Z23 Encounter for immunization: Secondary | ICD-10-CM

## 2018-07-07 NOTE — Unmapped (Signed)
[facility] Refill Coordination Note    Specialty Medication(s) to be Shipped:   Hematology/Oncology: Zytiga 250mg     Other medication(s) to be shipped: prednisone 5mg      John Watkins, DOB: [birth year]  Phone: [phone] (home)   Shipping Address: [address]  [facility] [place] [address]    All above HIPAA information was verified with patient's family member.     Completed refill call assessment today to schedule patient's medication shipment from the [facility] [phone]).       Specialty medication(s) and dose(s) confirmed: Regimen is correct and unchanged.   Changes to medications: Kylle reports no changes reported at this time.  Changes to insurance: No  Questions for the pharmacist: No    The patient will receive a drug information handout for each medication shipped and additional FDA Medication Guides as required.      DISEASE/MEDICATION-SPECIFIC INFORMATION        N/A    ADHERENCE     Medication Adherence    Patient reported X missed doses in the last month:  0  Specialty Medication:  zytiga 250mg   Patient is on additional specialty medications:  No  Patient is on more than two specialty medications:  No  Any gaps in refill history greater than 2 weeks in the last 3 months:  no  Demonstrates understanding of importance of adherence:  yes  Informant:  patient  Reliability of informant:  reliable      Adherence tools used:  medication list   Other adherence tool:  routine   Support network for adherence:  family member      Confirmed plan for next specialty medication refill:  delivery by pharmacy          Refill Coordination    Has the Patients' Contact Information Changed:  No  Is the Shipping Address Different:  No         MEDICARE PART B DOCUMENTATION     zytiga 250mg : Patient has 4 days on hand on hand.    SHIPPING     Shipping address confirmed in Epic.     Delivery Scheduled: Yes, Expected medication delivery date: 07/12/2018 via UPS or courier.     John Watkins [employer]   [facility] Specialty Technician

## 2018-07-12 MED FILL — ABIRATERONE 250 MG TABLET: 30 days supply | Qty: 120 | Fill #2 | Status: AC

## 2018-07-12 MED FILL — PREDNISONE 5 MG TABLET: 30 days supply | Qty: 60 | Fill #2 | Status: AC

## 2018-07-12 MED FILL — PREDNISONE 5 MG TABLET: ORAL | 30 days supply | Qty: 60 | Fill #2

## 2018-07-12 MED FILL — ABIRATERONE 250 MG TABLET: ORAL | 30 days supply | Qty: 120 | Fill #2

## 2018-07-15 ENCOUNTER — Ambulatory Visit: Admit: 2018-07-15 | Discharge: 2018-07-15 | Payer: MEDICARE | Attending: Medical Oncology | Primary: Medical Oncology

## 2018-07-15 ENCOUNTER — Other Ambulatory Visit: Admit: 2018-07-15 | Discharge: 2018-07-15 | Payer: MEDICARE

## 2018-07-15 DIAGNOSIS — C61 Malignant neoplasm of prostate: Principal | ICD-10-CM

## 2018-07-15 DIAGNOSIS — C775 Secondary and unspecified malignant neoplasm of intrapelvic lymph nodes: Secondary | ICD-10-CM

## 2018-07-15 DIAGNOSIS — Z7983 Long term (current) use of bisphosphonates: Secondary | ICD-10-CM | POA: Diagnosis not present

## 2018-07-15 DIAGNOSIS — C779 Secondary and unspecified malignant neoplasm of lymph node, unspecified: Secondary | ICD-10-CM | POA: Diagnosis not present

## 2018-07-15 DIAGNOSIS — Z79899 Other long term (current) drug therapy: Secondary | ICD-10-CM | POA: Diagnosis not present

## 2018-07-15 DIAGNOSIS — Z7982 Long term (current) use of aspirin: Secondary | ICD-10-CM | POA: Diagnosis not present

## 2018-07-15 DIAGNOSIS — Z79818 Long term (current) use of other agents affecting estrogen receptors and estrogen levels: Secondary | ICD-10-CM | POA: Diagnosis not present

## 2018-07-15 LAB — PROSTATE SPECIFIC ANTIGEN: Prostate specific Ag:MCnc:Pt:Ser/Plas:Qn:: 14.5 — ABNORMAL HIGH

## 2018-07-15 LAB — COMPREHENSIVE METABOLIC PANEL
ALBUMIN: 3.8 g/dL (ref 3.5–5.0)
ALT (SGPT): 26 U/L (ref 19–72)
ANION GAP: 4 mmol/L — ABNORMAL LOW (ref 7–15)
AST (SGOT): 19 U/L (ref 19–55)
BILIRUBIN TOTAL: 0.4 mg/dL (ref 0.0–1.2)
BUN / CREAT RATIO: 15
CALCIUM: 9.4 mg/dL (ref 8.5–10.2)
CHLORIDE: 107 mmol/L (ref 98–107)
CO2: 29 mmol/L (ref 22.0–30.0)
CREATININE: 0.91 mg/dL (ref 0.70–1.30)
EGFR CKD-EPI AA MALE: >90 mL/min/{1.73_m2} (ref >=60)
EGFR CKD-EPI NON-AA MALE: 79 mL/min/{1.73_m2} (ref >=60)
GLUCOSE RANDOM: 92 mg/dL (ref 65–179)
POTASSIUM: 4.2 mmol/L (ref 3.5–5.0)
PROTEIN TOTAL: 6.6 g/dL (ref 6.5–8.3)

## 2018-07-15 LAB — ALT (SGPT): Alanine aminotransferase:CCnc:Pt:Ser/Plas:Qn:: 26

## 2018-07-15 LAB — TESTOSTERONE TOTAL: Testosterone:MCnc:Pt:Ser/Plas:Qn:: 34 — ABNORMAL LOW

## 2018-07-15 LAB — BILIRUBIN DIRECT: Bilirubin.glucuronidated:MCnc:Pt:Ser/Plas:Qn:: <0.1

## 2018-07-15 NOTE — Unmapped (Signed)
Called pt to let him know his PSA results and POC. Left VM with callback number. Unable to leave VM on his mobile phone.

## 2018-07-15 NOTE — Unmapped (Signed)
LM to notify pt of scans scheduled on 12/4 prior to appt with Dr. Vernell Barrier. Detail of scan prep as well as locations left for pt.    John Watkins

## 2018-07-15 NOTE — Unmapped (Signed)
Addended byFrederic Jericho on: 07/15/2018 11:31 AM     Modules accepted: Orders

## 2018-07-15 NOTE — Unmapped (Signed)
Nice seeing you today.   If you have any questions please call the Nurse Navigator in the office at [phone], or [phone]) [phone].  Frederic Jericho, MD

## 2018-07-15 NOTE — Unmapped (Signed)
Addended by: Santa Genera on: 07/15/2018 10:51 AM     Modules accepted: Orders

## 2018-07-15 NOTE — Unmapped (Signed)
Spoke with pt and let him know that his PSA went up to 14.5. Dr. Vernell Barrier wants him to come back in a month for scans and to see him. Pt prefers to see genetics when he is back to see Dr. Vernell Barrier. Informed pt that trial coordinator is going to reach out to him regarding a couple of trials that pt may be eligible for. Contact information provided for any additional questions.

## 2018-07-15 NOTE — Unmapped (Signed)
1   Jaece Ducharme  Male, 82 y.o., 08/17/1936  MRN:   [id number]  Phone:   [phone] (H)  PCP:   Anastasio Champion, MD  Coverage:   Monia Pouch MEDICARE ADV/AETNA MEDICARE ADV  Next Appt  With Hematology and Oncology (ADULT ONC LAB)  10/14/2018 at 10:30 AM  RE: protocol candidate   Received: Today   Message Contents   Marcellas Marchant Claudina Lick, RN  Maurie Boettcher, MD; Eugenia Mcalpine   Cc: Chrisandra Netters, MD      ??      Fara Boros,     Spoke with pt and he will be waiting for your call.     Thanks,   Monica Martinez    Previous Messages      ----- Message -----   From: Maurie Boettcher, MD   Sent: 07/15/2018 ??11:26 AM EDT   To: Elijah Birk, RN, *   Subject: protocol candidate ?? ?? ?? ?? ?? ?? ?? ?? ?? ?? ?? ?? ??     Hello, I heard from Dr. Vernell Barrier about this pt, who seems like a candidate for pre-screening on TRITON3 and also for ProStar, [id number].     Please reach out to get him pre-screened with sequencing of the tumor on TRITON3.     Thanks.

## 2018-07-15 NOTE — Unmapped (Signed)
Labs drawn and sent for analysis.  Care provided by  J Carey, RN

## 2018-07-15 NOTE — Unmapped (Signed)
-----   Message from Estevan Oaks sent at 07/15/2018  3:25 PM EDT -----  Pt scheduled, I left a message to notify.    Thanks,  Jaynie Bream  ----- Message -----  From: Elijah Birk, RN  Sent: 07/15/2018  12:32 PM EDT  To: Devona Konig,    Not sure if the message I routed to you went thru. Can you schedule this pt to see Dr. Vernell Barrier 1 month from now with labs and scan. He prefers for bone scan and CT to be done in [facility] day before.    Thanks,  [employer]

## 2018-07-15 NOTE — Unmapped (Signed)
Hi,     John Watkins contacted the [employer] requesting to speak with the care team of John Watkins to discuss:    Patient returning phone call about PSA levels.    Please contact John Watkins at [phone].    Thank you,   Jacques Navy  [facility]   [phone]

## 2018-07-15 NOTE — Unmapped (Addendum)
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  I previously asked Radiation Oncology to weigh in about potential RT to the lymph nodes, and they felt this would not be beneficial and would confer toxicity without likely long-term disease control.  He has been asymptomatic.  He has had RT to the prostate in the past.      Plan:  - Continue Lupron every three months (07/15/18).  - Continue abiraterone/prednisone.  - Fatigue on abiraterone - he does not want to try ritalin currently but might consider in the future.  - Follow PSA.  - Follow LFTs - today pending.  - Blood pressure control with his PCP and followed by [facility].  - Nocturia: persistent, does not want Flomax.   - Anxiety: On Lexapro - Pharmacy ran interactions for both olaparib and abiraterone.  - Smoking: He understands risks, and the [place] smoking cessation program here has been meeting with him for counseling.  - Bone density: Checked again in 6/19 and although only mildly low, had 10% loss in bone density from 2016-2019 on Lupron and now prednisone.  Starting Fosamax, calcium/D.  Follow up for Lupron and monitoring on abiraterone.    ADDENDUM: PSA came back elebated again, 14.9.  Will restage with BS and CT, and screen for Prostar and Triton clinical trials,  Will also work to arrange genomic testing/counseling visit.    -------------------------------------------------------    Referring physician:  Riki Altes, MD [facility])  Other physician:  Irven Easterly, MD    CC: Prostate cancer to lymph nodes and possibly bone s/p IMRT to prostate, with elevated PSA, on Lupron    Current therapy: Lupron    HPI:  2010: IMRT to prostate for T1c prostate adenocarcinoma, biopsy with up to Gleason 4+3=7.  Subsequent PSA rise with negative saturation biopsy on 11/17/12.  - 04/05/13: 8.3  - 08/29/13: 12.8  - 02/09/14: 13.3  10/2013: MRI with enlarged left obturator, iliac and para-aortic nodes up to 3 cm consistent with metastatic disease. Bilateral ischia and L pelvis with bony areas <82mm of unclear etiology (bone islands vs. metastases).  10/2013: Lupron started  Subsequent PSA:  - 05/01/14: 1.4  - 08/02/14: 1.4  - 11/03/14: 1.7  01/18/16: Bone scan: no metastases.  01/18/16: CT A/P: 2.6 cm left external iliac chain hyperenhancing node (series 6, image 57). Several prominent subcentimeter lymph nodes are seen along the left para-aortic retroperitoneum.  Prostate is enlarged measuring 6.0 x 6.7 x 9.3 cm. [Possible lipoma in gluteus maximus, or could be related to Lupron injection, discussed w urology, no action warranted.]  10/16/16: restaging:   - CT CAP: Interval increase in size of left external and common iliac chain as well as periaortic lymph nodes, worrisome for metastatic disease.   - Bone Scan: No evidence of osseous metastatic disease.  5/18: Abiraterone/prednisone  6/19: BMD (c/w 2016): Lumbar spine: ??Normal bone density. ??The measurement has decreased significantly since prior study.  Left proximal femur: Mildly low bone density. ??The measurement has decreased significantly since prior study.    ROS: Nocturia: every 2-3 hours but drinks tea before bed, doesn't bother him.  No pain.  Fatigue.  Good appetite.  Has tolerated Lupron well.  Remainder of 10 system ROS negative.    PE  BP 153/67  - Pulse 51  - Temp 36.9 ??C (98.4 ??F) (Oral)  - Resp 18  - Ht 174.6  cm (5' 8.74)  - Wt 77.5 kg (170 lb 12.8 oz)  - SpO2 99%  - BMI 25.41 kg/m??   NAD  A+Ox3  No rash  No LEE  No cords  Neuro non focal  CTA  RRR  Abd NT ND    Current Outpatient Medications   Medication Sig Dispense Refill   ??? abiraterone (ZYTIGA) 250 mg Tab tablet TAKE 4 TABLETS BY MOUTH ONCE DAILY 120 each 6   ??? alendronate (FOSAMAX) 70 MG tablet Take 1 tablet (70 mg total) by mouth every seven (7) days. 12.85 tablet 3   ??? amLODIPine-benazepril (LOTREL 5-20) 5-20 mg per capsule Take 1 capsule by mouth daily.     ??? ascorbic acid, vitamin C, (VITAMIN C) 250 MG tablet Take 250 mg by mouth.     ??? aspirin (ECOTRIN) 81 MG tablet Take 81 mg by mouth.     ??? escitalopram oxalate (LEXAPRO) 10 MG tablet Take 1 tablet (10 mg total) by mouth daily. 90 tablet 1   ??? leuprolide (LUPRON) 22.5 mg injection Inject 22.5 mg into the muscle Every three (3) months.     ??? multivitamin (THERAGRAN) per tablet Take 1 tablet by mouth daily.     ??? predniSONE (DELTASONE) 5 MG tablet TAKE 2 TABLETS BY MOUTH ONCE DAILY 60 tablet 6     No current facility-administered medications for this visit.      Allergies   Allergen Reactions   ??? Valium [Diazepam] Other (See Comments)     Hallucinations, altered mental status  Hallucinations, altered mental status           No visits with results within 2 Week(s) from this visit.   Latest known visit with results is:   Lab on 04/15/2018   Component Date Value Ref Range Status   ??? PSA 04/15/2018 10.60* 0.00 - 4.00 ng/mL Final   ??? Testosterone 04/15/2018 54* 179 - 756 ng/dL Final   ??? Sodium 47/82/9562 138  135 - 145 mmol/L Final   ??? Potassium 04/15/2018 4.0  3.5 - 5.0 mmol/L Final   ??? Chloride 04/15/2018 107  98 - 107 mmol/L Final   ??? CO2 04/15/2018 27.0  22.0 - 30.0 mmol/L Final   ??? Anion Gap 04/15/2018 4* 9 - 15 mmol/L Final   ??? BUN 04/15/2018 13  7 - 21 mg/dL Final   ??? Creatinine 04/15/2018 0.86  0.70 - 1.30 mg/dL Final   ??? BUN/Creatinine Ratio 04/15/2018 15   Final   ??? EGFR CKD-EPI Non-African American,* 04/15/2018 82  >=60 mL/min/1.29m2 Final   ??? EGFR CKD-EPI African American, Male 04/15/2018 >90  >=60 mL/min/1.48m2 Final   ??? Glucose 04/15/2018 111  65 - 179 mg/dL Final   ??? Calcium 13/04/6577 9.1  8.5 - 10.2 mg/dL Final   ??? Albumin 46/96/2952 3.3* 3.5 - 5.0 g/dL Final   ??? Total Protein 04/15/2018 5.8* 6.5 - 8.3 g/dL Final   ??? Total Bilirubin 04/15/2018 0.4  0.0 - 1.2 mg/dL Final   ??? AST 84/13/2440 17* 19 - 55 U/L Final   ??? ALT 04/15/2018 17* 19 - 72 U/L Final   ??? 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  I previously asked Radiation Oncology to weigh in about potential RT to the lymph nodes, and they felt this would not be beneficial and would confer toxicity without likely long-term disease control.  He has been asymptomatic.  He has had RT to the prostate in the past.      Plan:  - Continue Lupron every three months (04/15/18).  - Continue abiraterone/prednisone.  - Fatigue on abiraterone - he does not want to try ritalin currently but might consider in the future.  - Follow PSA.  - Follow LFTs - today pending.  - Blood pressure control with his PCP.  - Nocturia: persistent, does not want Flomax.   - Anxiety: On Lexapro - Pharmacy ran interactions for both olaparib and abiraterone.  - Smoking: He understands risks, and the [place] smoking cessation program here has been meeting with him for counseling.  - Bone density: Checked again in 6/19 and although only mildly low, had 10% loss in bone density from 2016-2019 on Lupron and now prednisone.  Starting Fosamax, calcium/D.  Follow up for Lupron and monitoring on abiraterone.    -------------------------------------------------------    Referring physician:  Riki Altes, MD [facility])  Other physician:  Irven Easterly, MD    CC: Prostate cancer to lymph nodes and possibly bone s/p IMRT to prostate, with elevated PSA, on Lupron    Current therapy: Lupron    HPI:  2010: IMRT to prostate for T1c prostate adenocarcinoma, biopsy with up to Gleason 4+3=7.  Subsequent PSA rise with negative saturation biopsy on 11/17/12.  - 04/05/13: 8.3  - 08/29/13: 12.8  - 02/09/14: 13.3  10/2013: MRI with enlarged left obturator, iliac and para-aortic nodes up to 3 cm consistent with metastatic disease. Bilateral ischia and L pelvis with bony areas <39mm of unclear etiology (bone islands vs. metastases).  10/2013: Lupron started  Subsequent PSA:  - 05/01/14: 1.4  - 08/02/14: 1.4  - 11/03/14: 1.7  01/18/16: Bone scan: no metastases.  01/18/16: CT A/P: 2.6 cm left external iliac chain hyperenhancing node (series 6, image 57). Several prominent subcentimeter lymph nodes are seen along the left para-aortic retroperitoneum.  Prostate is enlarged measuring 6.0 x 6.7 x 9.3 cm. [Possible lipoma in gluteus maximus, or could be related to Lupron injection, discussed w urology, no action warranted.]  10/16/16: restaging:   - CT CAP: Interval increase in size of left external and common iliac chain as well as periaortic lymph nodes, worrisome for metastatic disease.   - Bone Scan: No evidence of osseous metastatic disease.  5/18: Abiraterone/prednisone  6/19: BMD (c/w 2016): Lumbar spine: ??Normal bone density. ??The measurement has decreased significantly since prior study.  Left proximal femur: Mildly low bone density. ??The measurement has decreased significantly since prior study.    ROS: Nocturia: every 2-3 hours but drinks tea before bed, doesn't bother him.  No pain.  Fatigue.  Good appetite.  Has tolerated Lupron well.  Remainder of 10 system ROS negative.    PE  BP 153/67  - Pulse 51  - Temp 36.9 ??C (98.4 ??F) (Oral)  - Resp 18  - Ht 174.6 cm (5' 8.74)  - Wt 77.5 kg (170 lb 12.8 oz)  - SpO2 99%  - BMI 25.41 kg/m??   NAD  A+Ox3  No rash  No LEE  No cords  Neuro non focal  CTA  RRR  Abd NT ND    Current Outpatient Medications   Medication Sig Dispense Refill   ??? abiraterone (ZYTIGA) 250 mg Tab tablet TAKE 4 TABLETS BY MOUTH ONCE DAILY 120 each 6   ??? alendronate (FOSAMAX) 70 MG tablet Take 1 tablet (70 mg total) by mouth every seven (7) days. 12.85 tablet 3   ??? amLODIPine-benazepril (LOTREL 5-20) 5-20 mg per capsule Take 1 capsule by mouth daily.     ??? ascorbic acid, vitamin C, (VITAMIN C) 250 MG tablet Take 250 mg by mouth.     ??? aspirin (ECOTRIN) 81 MG tablet Take 81 mg by mouth.     ??? escitalopram oxalate (LEXAPRO) 10 MG tablet Take 1 tablet (10 mg total) by mouth daily. 90 tablet 1   ??? leuprolide (LUPRON) 22.5 mg injection Inject 22.5 mg into the muscle Every three (3) months.     ??? multivitamin (THERAGRAN) per tablet Take 1 tablet by mouth daily.     ??? predniSONE (DELTASONE) 5 MG tablet TAKE 2 TABLETS BY MOUTH ONCE DAILY 60 tablet 6     No current facility-administered medications for this visit.      Allergies   Allergen Reactions   ??? Valium [Diazepam] Other (See Comments)     Hallucinations, altered mental status  Hallucinations, altered mental status           No visits with results within 2 Week(s) from this visit.   Latest known visit with results is:   Lab on 04/15/2018   Component Date Value Ref Range Status   ??? PSA 04/15/2018 10.60* 0.00 - 4.00 ng/mL Final   ??? Testosterone 04/15/2018 54* 179 - 756 ng/dL Final   ??? Sodium 16/06/9603 138  135 - 145 mmol/L Final   ??? Potassium 04/15/2018 4.0  3.5 - 5.0 mmol/L Final   ??? Chloride 04/15/2018 107  98 - 107 mmol/L Final   ??? CO2 04/15/2018 27.0  22.0 - 30.0 mmol/L Final   ??? Anion Gap 04/15/2018 4* 9 - 15 mmol/L Final   ??? BUN 04/15/2018 13  7 - 21 mg/dL Final   ??? Creatinine 04/15/2018 0.86  0.70 - 1.30 mg/dL Final   ??? BUN/Creatinine Ratio 04/15/2018 15   Final   ??? EGFR CKD-EPI Non-African American,* 04/15/2018 82  >=60 mL/min/1.43m2 Final   ??? EGFR CKD-EPI African American, Male 04/15/2018 >90  >=60 mL/min/1.55m2 Final   ??? Glucose 04/15/2018 111  65 - 179 mg/dL Final   ??? Calcium 54/05/8118 9.1  8.5 - 10.2 mg/dL Final   ??? Albumin 14/78/2956 3.3* 3.5 - 5.0 g/dL Final   ??? Total Protein 04/15/2018 5.8* 6.5 - 8.3 g/dL Final   ??? Total Bilirubin 04/15/2018 0.4  0.0 - 1.2 mg/dL Final   ??? AST 21/30/8657 17* 19 - 55 U/L Final   ??? ALT 04/15/2018 17* 19 - 72 U/L Final   ??? Alkaline Phosphatase 04/15/2018 59  38 - 126 U/L Final       Lab Results   Component Value Date    PSA 10.60 (H) 04/15/2018    PSA 7.80 (H) 01/07/2018    PSA 5.72 (H) 10/01/2017    PSA 4.08 (H) 05/28/2017    PSA 5.22 (H) 02/26/2017    PSA 8.32 (H) 02/06/2017

## 2018-07-16 NOTE — Unmapped (Signed)
-----   Message from Collie Siad sent at 07/16/2018 12:28 PM EDT -----  Scheduled and confirmed - Almira Coaster  ----- Message -----  From: Elijah Birk, RN  Sent: 07/16/2018  12:01 PM EDT  To: Collie Siad    Can you also schedule a lab appointment on 11/13. Let pt know.  Thank you,  Monica Martinez  ----- Message -----  From: Collie Siad  Sent: 07/16/2018  11:57 AM EDT  To: Elijah Birk, RN, [place]    Scheduled and confirmed Almira Coaster  ----- Message -----  From: Elijah Birk, RN  Sent: 07/16/2018   9:20 AM EDT  To: Devona Konig,    Pt called and requesting to get scans and appointment earlier. Can you cancel scans on 12/4 and RTC appt on 12/5. Rescheduled to 11/21 8:40 am Dr. Vernell Barrier. Can you have bone scan ,CT and labs done day prior. They are available from 9-2:30pm daily. They take care of their grandson.    Thanks,  [employer]

## 2018-07-16 NOTE — Unmapped (Signed)
Clinical Pharmacist Practitioner: Genitourinary Clinic    Oral Chemotherapy Follow-Up    Assessment and recommendations:  1. Abiraterone monitoring and side effect management: patient denies any ADRs with abiraterone. Patient's BP today in clinic was 153/67, but home readings WNL. LFTs and K are also WNL PSA is slowly rising so will follow-up in a month.   -continue zytiga 1000 mg daily + prednisone 10 mg daily, and lupron    Follow- up: With Dr. Vernell Barrier in clinic on 08/26/2018  ______________________________________________________________________    Oral Chemotherapy: Zytiga 1000 mg daily + prednisone 10 mg      Start date: 01/2017    HPI: Castrate-resistant metastatic prostate cancer on Zytiga + Lupron.     Interim History: John Watkins reports feeling well today. He has no complaints with the Zytiga. He continues to take calcium/vitamin D supplements with weekly alendronate for osteoporosis. BP reading in clinic was elevated but reads 130s at home (highs in 140s, rarely). He denies any headaches, vision changes, or decreases in urination.     Adherence: He takes 4 tablets a day without food. He has not missed any doses. Takes 10 mg of prednisone at night (no sleep issues).    Drug-Drug Interactions: none     Medications:  Current Outpatient Medications   Medication Sig Dispense Refill   ??? abiraterone (ZYTIGA) 250 mg Tab tablet TAKE 4 TABLETS BY MOUTH ONCE DAILY 120 each 6   ??? alendronate (FOSAMAX) 70 MG tablet Take 1 tablet (70 mg total) by mouth every seven (7) days. 12.85 tablet 3   ??? amLODIPine-benazepril (LOTREL 5-20) 5-20 mg per capsule Take 1 capsule by mouth daily.     ??? ascorbic acid, vitamin C, (VITAMIN C) 250 MG tablet Take 250 mg by mouth.     ??? aspirin (ECOTRIN) 81 MG tablet Take 81 mg by mouth.     ??? calcium carbonate (OS-CAL) 600 mg calcium (1,500 mg) tablet Take 1 tablet by mouth daily.     ??? escitalopram oxalate (LEXAPRO) 10 MG tablet Take 1 tablet (10 mg total) by mouth daily. 90 tablet 1   ??? leuprolide (LUPRON) 22.5 mg injection Inject 22.5 mg into the muscle Every three (3) months.     ??? multivitamin (THERAGRAN) per tablet Take 1 tablet by mouth daily.     ??? predniSONE (DELTASONE) 5 MG tablet TAKE 2 TABLETS BY MOUTH ONCE DAILY 60 tablet 6     No current facility-administered medications for this visit.      I spent 10 minutes with [name] in direct patient care.     John Watkins, PharmD   PGY-2 Oncology Pharmacy Resident  Pager: [phone]    I was the precepting pharmacist in the delivery of services. I agree with the plan as documented.    Laverna Peace PharmD, BCOP, CPP  Hematology/Oncology Pharmacist  P: [phone]

## 2018-07-16 NOTE — Unmapped (Signed)
-----   Message from [facility], [facility] sent at 07/16/2018  2:11 PM EDT -----  Happy to see him then!  ----- Message -----  From: Elijah Birk, RN  Sent: 07/16/2018   1:13 PM EDT  To: Adonis Housekeeper [place], CGC    Ashlynn,    This pt is coming back 11/21 if you have time to see him that day.    Thanks,  [employer]

## 2018-07-16 NOTE — Unmapped (Signed)
Call pt to discuss potential participation in clinical trial, Triton3.

## 2018-07-16 NOTE — Unmapped (Signed)
Scheduled and confirmed scans and RTC    [facility]

## 2018-07-19 NOTE — Unmapped (Signed)
Called patient to discuss potential participation in clinical trial. No answer.

## 2018-07-20 NOTE — Unmapped (Signed)
Called patient to discuss potential participation in clinical trial. No answer.

## 2018-07-26 NOTE — Unmapped (Signed)
Called pt to discuss potential participation on Triton3 Clinical Trial. No answer; left message.

## 2018-07-30 ENCOUNTER — Ambulatory Visit: Payer: Medicare HMO | Admitting: Family Medicine

## 2018-07-30 ENCOUNTER — Encounter: Payer: Self-pay | Admitting: Family Medicine

## 2018-07-30 VITALS — BP 118/68 | HR 72 | Temp 98.6°F | Resp 16 | Ht 70.0 in | Wt 170.7 lb

## 2018-07-30 DIAGNOSIS — F320 Major depressive disorder, single episode, mild: Secondary | ICD-10-CM | POA: Diagnosis not present

## 2018-07-30 DIAGNOSIS — I739 Peripheral vascular disease, unspecified: Secondary | ICD-10-CM | POA: Diagnosis not present

## 2018-07-30 DIAGNOSIS — F32A Depression, unspecified: Secondary | ICD-10-CM

## 2018-07-30 DIAGNOSIS — Z720 Tobacco use: Secondary | ICD-10-CM | POA: Diagnosis not present

## 2018-07-30 DIAGNOSIS — I700 Atherosclerosis of aorta: Secondary | ICD-10-CM | POA: Diagnosis not present

## 2018-07-30 DIAGNOSIS — R69 Illness, unspecified: Secondary | ICD-10-CM | POA: Diagnosis not present

## 2018-07-30 DIAGNOSIS — I10 Essential (primary) hypertension: Secondary | ICD-10-CM | POA: Diagnosis not present

## 2018-07-30 DIAGNOSIS — D692 Other nonthrombocytopenic purpura: Secondary | ICD-10-CM | POA: Diagnosis not present

## 2018-07-30 MED ORDER — AMLODIPINE BESY-BENAZEPRIL HCL 5-20 MG PO CAPS: 1.0000 | ORAL_CAPSULE | Freq: Every day | ORAL | 1 refills | Status: DC

## 2018-07-30 MED ORDER — ESCITALOPRAM OXALATE 10 MG PO TABS: 10.0000 mg | ORAL_TABLET | Freq: Every day | ORAL | 1 refills | Status: DC

## 2018-07-30 NOTE — Progress Notes (Signed)
Name: Kevin Donovan   MRN: [id number]    DOB: 1936/07/31   Date:07/30/2018       Progress Note  Subjective  Chief Complaint  Chief Complaint  Patient presents with  . Follow-up    HPI  HTN: he was going to start a research medication for prostate cancer and bp was elevated during [facility] visit, so he could not start therapy,but on May 5th 2018  he started a different medication and has been doing well on it. BP at home also wellc controlled around 120's, usually high at [facility] but at goal today. He denies chest pain or palpitation   Prostate Cancer: he was diagnosed in 2008 in 2018  diagnosed with metastatic cancer, and resume therapy May 2018, PSA gradually going up and he will go back for CT scan and re-staging. He is on Lupron and Zytiga  Calcification abdominal aorta: found on CT abdomen, he has metastatic prostate cancer, continue aspirin , he does not want to take statins at this time. He does not want to have labs at this time  Depression: he states that since he started on medication ( Lexapro ) he has not noticed any difference in his behavior, however his wife has noticed a great improvement in his mood. He does not want to increase dose at this time.Symptoms have been stable.   Bradycardia on EKG: done at [facility], but no change since 2014.Unchanged   Tobacco: he is not ready to quit smoking yet, he is still smoking between 3/4 -1 pack daily.  He has a daily cough sometimes productive, no wheezing and no SOB, discussed spirometry and he refuses . He refuses therapy also   Claudication: he states he needs to stop after he walks for a prolonged period of time, he states it depends on the surface and how fast he is walking, discussed neurogenic versus vascular problems, he is not interested in having any studies done for it, he states he was diagnosed after radiation therapy for prostate cancer   Patient Active Problem List   Diagnosis Date Noted  . Senile purpura ([place])  01/28/2018  . Hot flashes 05/28/2017  . Fatigue 05/28/2017  . First degree AV block 11/28/2016  . Bradycardia on ECG 11/14/2016  . Situational depression 10/31/2016  . Calcification of abdominal aorta (H[facility]) 10/31/2016  . Degenerative disc disease, lumbar 10/31/2016  . Prostate cancer metastatic to intrapelvic lymph node ([place]) 10/23/2016  . Malignant neoplasm of prostate ([place]) 08/27/2012  . Enlarged prostate with lower urinary tract symptoms (LUTS) 08/27/2012    Past Surgical History:  Procedure Laterality Date  . APPENDECTOMY  2011  . COLONOSCOPY  2005    Family History  Problem Relation Age of Onset  . Lupus Mother   . Heart disease Mother        CHF  . Heart disease Father     Social History   Socioeconomic History  . Marital status: Married    Spouse name: [name]  . Number of children: 1  . Years of education: Not on file  . Highest education level: Some college, no degree  Occupational History  . Not on file  Social Needs  . Financial resource strain: Not hard at all  . Food insecurity:    Worry: Never true    Inability: Never true  . Transportation needs:    Medical: No    Non-medical: No  Tobacco Use  . Smoking status: Current Every Day Smoker    Packs/day: 0.75  Years: 72.00    Pack years: 54.00    Types: Cigarettes    Start date: 01/29/1944  . Smokeless tobacco: Never Used  Substance and Sexual Activity  . Alcohol use: No  . Drug use: No  . Sexual activity: Yes    Partners: Female  Lifestyle  . Physical activity:    Days per week: 7 days    Minutes per session: 150+ min  . Stress: Not at all  Relationships  . Social connections:    Talks on phone: Three times a week    Gets together: Once a week    Attends religious service: Never    Active member of club or organization: No    Attends meetings of clubs or organizations: Never    Relationship status: Married  . Intimate partner violence:    Fear of current or ex partner: No     Emotionally abused: No    Physically abused: No    Forced sexual activity: No  Other Topics Concern  . Not on file  Social History Narrative  . Not on file     Current Outpatient Medications:  .  abiraterone Acetate (ZYTIGA) 250 MG tablet, Take 1,000 mg by mouth daily. , Disp: , Rfl:  .  alendronate (FOSAMAX) 70 MG tablet, Take by mouth., Disp: , Rfl:  .  amLODipine-benazepril (LOTREL) 5-20 MG capsule, Take 1 capsule by mouth daily., Disp: 90 capsule, Rfl: 1 .  aspirin 81 MG tablet, Take 1 tablet by mouth daily., Disp: , Rfl:  .  clotrimazole-betamethasone (LOTRISONE) cream, Apply 1 application topically 2 (two) times daily., Disp: 45 g, Rfl: 0 .  escitalopram (LEXAPRO) 10 MG tablet, Take 1 tablet (10 mg total) by mouth daily., Disp: 90 tablet, Rfl: 1 .  leuprolide (LUPRON) 22.5 MG injection, Inject 22.5 mg every 3 (three) months into the muscle. , Disp: , Rfl:  .  Multiple Vitamin (MULTI-VITAMINS) TABS, Take by mouth., Disp: , Rfl:  .  predniSONE (DELTASONE) 5 MG tablet, Take 10 mg daily with breakfast by mouth. , Disp: , Rfl:  .  vitamin C (ASCORBIC ACID) 250 MG tablet, Take 250 mg by mouth daily., Disp: , Rfl:  .  ketoconazole (NIZORAL) 2 % cream, Apply 1 application topically 2 (two) times daily. (Patient not taking: Reported on 07/30/2018), Disp: 60 g, Rfl: 0  Allergies  Allergen Reactions  . Diazepam Other (See Comments)    Hallucinations, altered mental status    I personally reviewed active problem list, medication list, allergies, family history, social history with the patient/caregiver today.   ROS  Constitutional: Negative for fever or weight change.  Respiratory: Negative for cough and shortness of breath.   Cardiovascular: Negative for chest pain or palpitations.  Gastrointestinal: Negative for abdominal pain, no bowel changes.  Musculoskeletal: Negative for gait problem or joint swelling.  Skin: Negative for rash.  Neurological: Negative for dizziness or  headache.  No other specific complaints in a complete review of systems (except as listed in HPI above).  Objective  Vitals:   07/30/18 1103  BP: 118/68  Pulse: 72  Resp: 16  Temp: 98.6 F (37 C)  TempSrc: Oral  SpO2: 99%  Weight: 170 lb 11.2 oz (77.4 kg)  Height: 5\' 10"  (1.778 m)    Body mass index is 24.49 kg/m.  Physical Exam  Constitutional: Patient appears well-developed and well-nourished.  No distress.  HEENT: head atraumatic, normocephalic, pupils equal and reactive to light,  neck supple, throat within normal limits  Cardiovascular: Normal rate, regular rhythm and normal heart sounds.  No murmur heard. No BLE edema. Pulmonary/Chest: Effort normal and breath sounds normal. No respiratory distress. Abdominal: Soft.  There is no tenderness. Skin: senile purpura on both arms  Psychiatric: Patient has a normal mood and affect. behavior is normal. Judgment and thought content normal.  PHQ2/9: Depression screen [facility] 2/9 07/30/2018 01/28/2018 10/31/2016  Decreased Interest 0 0 0  Down, Depressed, Hopeless 0 0 0  PHQ - 2 Score 0 0 0  Altered sleeping 0 - -  Tired, decreased energy 0 - -  Change in appetite 0 - -  Feeling bad or failure about yourself  0 - -  Trouble concentrating 0 - -  Moving slowly or fidgety/restless 0 - -  Suicidal thoughts 0 - -  PHQ-9 Score 0 - -  Difficult doing work/chores Not difficult at all Not difficult at all -     Fall Risk: Fall Risk  07/30/2018 01/28/2018 07/31/2017 07/07/2017 10/31/2016  Falls in the past year? 0 No No No No  Injury with Fall? 0 - - - -    Functional Status Survey: Is the patient deaf or have difficulty hearing?: Yes(due to age) Does the patient have difficulty seeing, even when wearing glasses/contacts?: Yes(due to age. patient wears reading glasses when it is fine print) Does the patient have difficulty walking or climbing stairs?: No Does the patient have difficulty dressing or bathing?: No Does the patient have  difficulty doing errands alone such as visiting a doctor's office or shopping?: No    Assessment & Plan  1. Essential hypertension  - amLODipine-benazepril (LOTREL) 5-20 MG capsule; Take 1 capsule by mouth daily.  Dispense: 90 capsule; Refill: 1  2. Mild depression (HCC)  - escitalopram (LEXAPRO) 10 MG tablet; Take 1 tablet (10 mg total) by mouth daily.  Dispense: 90 tablet; Refill: 1  3. Senile purpura (HCC)  Stable, on both arms  4. Calcification of abdominal aorta (HCC)  Refuses statin therapy , he does not want to have labs done  5. Claudication [facility])  Per patient and his wife secondary to radiation for prostate cancer

## 2018-08-04 ENCOUNTER — Other Ambulatory Visit: Admit: 2018-08-04 | Discharge: 2018-08-17 | Payer: MEDICARE

## 2018-08-04 ENCOUNTER — Ambulatory Visit: Admit: 2018-08-04 | Discharge: 2018-09-02 | Payer: MEDICARE

## 2018-08-04 ENCOUNTER — Ambulatory Visit: Admit: 2018-08-04 | Discharge: 2018-08-17 | Payer: MEDICARE

## 2018-08-04 DIAGNOSIS — C61 Malignant neoplasm of prostate: Principal | ICD-10-CM

## 2018-08-04 DIAGNOSIS — C775 Secondary and unspecified malignant neoplasm of intrapelvic lymph nodes: Secondary | ICD-10-CM

## 2018-08-04 DIAGNOSIS — K5730 Diverticulosis of large intestine without perforation or abscess without bleeding: Secondary | ICD-10-CM | POA: Diagnosis not present

## 2018-08-04 DIAGNOSIS — K869 Disease of pancreas, unspecified: Secondary | ICD-10-CM | POA: Diagnosis not present

## 2018-08-04 DIAGNOSIS — R918 Other nonspecific abnormal finding of lung field: Secondary | ICD-10-CM | POA: Diagnosis not present

## 2018-08-04 DIAGNOSIS — N281 Cyst of kidney, acquired: Secondary | ICD-10-CM | POA: Diagnosis not present

## 2018-08-04 DIAGNOSIS — I700 Atherosclerosis of aorta: Secondary | ICD-10-CM | POA: Diagnosis not present

## 2018-08-04 DIAGNOSIS — N400 Enlarged prostate without lower urinary tract symptoms: Secondary | ICD-10-CM | POA: Diagnosis not present

## 2018-08-04 DIAGNOSIS — R590 Localized enlarged lymph nodes: Secondary | ICD-10-CM | POA: Diagnosis not present

## 2018-08-04 DIAGNOSIS — E279 Disorder of adrenal gland, unspecified: Secondary | ICD-10-CM | POA: Diagnosis not present

## 2018-08-04 DIAGNOSIS — D1724 Benign lipomatous neoplasm of skin and subcutaneous tissue of left leg: Secondary | ICD-10-CM | POA: Diagnosis not present

## 2018-08-04 LAB — COMPREHENSIVE METABOLIC PANEL
ALBUMIN: 3.7 g/dL (ref 3.5–5.0)
ALKALINE PHOSPHATASE: 60 U/L (ref 38–126)
ALT (SGPT): 14 U/L (ref <50)
AST (SGOT): 16 U/L — ABNORMAL LOW (ref 19–55)
BLOOD UREA NITROGEN: 11 mg/dL (ref 7–21)
BUN / CREAT RATIO: 14
CALCIUM: 9.3 mg/dL (ref 8.5–10.2)
CHLORIDE: 104 mmol/L (ref 98–107)
CO2: 28 mmol/L (ref 22.0–30.0)
CREATININE: 0.78 mg/dL (ref 0.70–1.30)
EGFR CKD-EPI AA MALE: >90 mL/min/{1.73_m2} (ref >=60)
EGFR CKD-EPI NON-AA MALE: 85 mL/min/{1.73_m2} (ref >=60)
GLUCOSE RANDOM: 92 mg/dL (ref 65–179)
POTASSIUM: 3.8 mmol/L (ref 3.5–5.0)
PROTEIN TOTAL: 6.4 g/dL — ABNORMAL LOW (ref 6.5–8.3)
SODIUM: 139 mmol/L (ref 135–145)

## 2018-08-04 LAB — CBC W/ AUTO DIFF
BASOPHILS ABSOLUTE COUNT: 0.1 10*9/L (ref 0.0–0.1)
BASOPHILS RELATIVE PERCENT: 0.5 %
EOSINOPHILS ABSOLUTE COUNT: 0.2 10*9/L (ref 0.0–0.4)
EOSINOPHILS RELATIVE PERCENT: 1.8 %
HEMATOCRIT: 42.8 % (ref 41.0–53.0)
HEMOGLOBIN: 14.2 g/dL (ref 13.5–17.5)
LARGE UNSTAINED CELLS: 2 % (ref 0–4)
LYMPHOCYTES ABSOLUTE COUNT: 2.5 10*9/L (ref 1.5–5.0)
LYMPHOCYTES RELATIVE PERCENT: 21 %
MEAN CORPUSCULAR HEMOGLOBIN CONC: 33.3 g/dL (ref 31.0–37.0)
MEAN CORPUSCULAR HEMOGLOBIN: 33 pg (ref 26.0–34.0)
MEAN CORPUSCULAR VOLUME: 98.9 fL (ref 80.0–100.0)
MONOCYTES ABSOLUTE COUNT: 0.6 10*9/L (ref 0.2–0.8)
MONOCYTES RELATIVE PERCENT: 5.1 %
NEUTROPHILS ABSOLUTE COUNT: 8.2 10*9/L — ABNORMAL HIGH (ref 2.0–7.5)
PLATELET COUNT: 334 10*9/L (ref 150–440)
RED BLOOD CELL COUNT: 4.32 10*12/L — ABNORMAL LOW (ref 4.50–5.90)
RED CELL DISTRIBUTION WIDTH: 14.6 % (ref 12.0–15.0)
WBC ADJUSTED: 11.7 10*9/L — ABNORMAL HIGH (ref 4.5–11.0)

## 2018-08-04 LAB — PROSTATE SPECIFIC ANTIGEN: Prostate specific Ag:MCnc:Pt:Ser/Plas:Qn:: 16.7 — ABNORMAL HIGH

## 2018-08-04 LAB — CO2: Carbon dioxide:SCnc:Pt:Ser/Plas:Qn:: 28

## 2018-08-04 LAB — NEUTROPHILS ABSOLUTE COUNT: Lab: 8.2 — ABNORMAL HIGH

## 2018-08-04 NOTE — Unmapped (Signed)
Labs drawn and sent for analysis.  Care provided by  Laveda Norman, RN

## 2018-08-09 NOTE — Unmapped (Signed)
[facility] Refill Coordination Note    Specialty Medication(s) to be Shipped:   Hematology/Oncology: predniSONE 5 MG and abiraterone 250 mg        John Watkins, DOB: Sep 28, 1935  Phone: [phone] (home)       All above HIPAA information was verified with patient.     Completed refill call assessment today to schedule patient's medication shipment from the [facility] [phone]).       Specialty medication(s) and dose(s) confirmed: Regimen is correct and unchanged.   Changes to medications: John Watkins reports no changes reported at this time.  Changes to insurance: No  Questions for the pharmacist: No    The patient will receive a drug information handout for each medication shipped and additional FDA Medication Guides as required.      DISEASE/MEDICATION-SPECIFIC INFORMATION        N/A     ADHERENCE     Medication Adherence    Patient reported X missed doses in the last month:  0  Specialty Medication:  abiraterone 250 mg  Patient is on additional specialty medications:  Yes  Additional Specialty Medications:  predniSONE 5 MG  Patient Reported Additional Medication X Missed Doses in the Last Month:  0  Patient is on more than two specialty medications:  No  Any gaps in refill history greater than 2 weeks in the last 3 months:  no  Demonstrates understanding of importance of adherence:  yes  Informant:  patient  Reliability of informant:  reliable      Adherence tools used:  medication list   Other adherence tool:  routine   Support network for adherence:  family member      Confirmed plan for next specialty medication refill:  delivery by pharmacy          Refill Coordination    Has the Patients' Contact Information Changed:  No  Is the Shipping Address Different:  No         SHIPPING     Shipping address confirmed in Epic.     Delivery Scheduled: Yes, Expected medication delivery date: 08/10/2018 via UPS or courier.     Medication will be delivered via UPS to the home address in Epic WAM.    John Watkins   [facility] Specialty Technician

## 2018-08-10 MED FILL — PREDNISONE 5 MG TABLET: 30 days supply | Qty: 60 | Fill #3 | Status: AC

## 2018-08-10 MED FILL — PREDNISONE 5 MG TABLET: ORAL | 30 days supply | Qty: 60 | Fill #3

## 2018-08-12 ENCOUNTER — Ambulatory Visit: Admit: 2018-08-12 | Discharge: 2018-08-12 | Payer: MEDICARE

## 2018-08-12 ENCOUNTER — Ambulatory Visit: Admit: 2018-08-12 | Discharge: 2018-08-12 | Payer: MEDICARE | Attending: Children | Primary: Children

## 2018-08-12 ENCOUNTER — Ambulatory Visit: Admit: 2018-08-12 | Discharge: 2018-08-12 | Payer: MEDICARE | Attending: Medical Oncology | Primary: Medical Oncology

## 2018-08-12 DIAGNOSIS — C7951 Secondary malignant neoplasm of bone: Secondary | ICD-10-CM

## 2018-08-12 DIAGNOSIS — C61 Malignant neoplasm of prostate: Principal | ICD-10-CM

## 2018-08-12 DIAGNOSIS — C775 Secondary and unspecified malignant neoplasm of intrapelvic lymph nodes: Secondary | ICD-10-CM

## 2018-08-12 LAB — CBC W/ AUTO DIFF
BASOPHILS ABSOLUTE COUNT: 0.1 10*9/L (ref 0.0–0.1)
BASOPHILS RELATIVE PERCENT: 0.5 %
EOSINOPHILS ABSOLUTE COUNT: 0.3 10*9/L (ref 0.0–0.4)
EOSINOPHILS RELATIVE PERCENT: 2.3 %
HEMATOCRIT: 42.6 % (ref 41.0–53.0)
HEMOGLOBIN: 13.7 g/dL (ref 13.5–17.5)
LARGE UNSTAINED CELLS: 1 % (ref 0–4)
LYMPHOCYTES ABSOLUTE COUNT: 2.3 10*9/L (ref 1.5–5.0)
LYMPHOCYTES RELATIVE PERCENT: 18.9 %
MEAN CORPUSCULAR HEMOGLOBIN: 32.5 pg (ref 26.0–34.0)
MEAN CORPUSCULAR VOLUME: 100.9 fL — ABNORMAL HIGH (ref 80.0–100.0)
MEAN PLATELET VOLUME: 8.2 fL (ref 7.0–10.0)
MONOCYTES ABSOLUTE COUNT: 0.8 10*9/L (ref 0.2–0.8)
NEUTROPHILS ABSOLUTE COUNT: 8.8 10*9/L — ABNORMAL HIGH (ref 2.0–7.5)
NEUTROPHILS RELATIVE PERCENT: 70.9 %
PLATELET COUNT: 360 10*9/L (ref 150–440)
RED BLOOD CELL COUNT: 4.22 10*12/L — ABNORMAL LOW (ref 4.50–5.90)
RED CELL DISTRIBUTION WIDTH: 14.7 % (ref 12.0–15.0)

## 2018-08-12 LAB — COMPREHENSIVE METABOLIC PANEL
ALBUMIN: 3.5 g/dL (ref 3.5–5.0)
ALKALINE PHOSPHATASE: 72 U/L (ref 38–126)
ALT (SGPT): 17 U/L (ref <50)
ANION GAP: 7 mmol/L (ref 7–15)
AST (SGOT): 18 U/L — ABNORMAL LOW (ref 19–55)
BLOOD UREA NITROGEN: 10 mg/dL (ref 7–21)
BUN / CREAT RATIO: 13
CALCIUM: 9.1 mg/dL (ref 8.5–10.2)
CHLORIDE: 105 mmol/L (ref 98–107)
CO2: 29 mmol/L (ref 22.0–30.0)
EGFR CKD-EPI AA MALE: >90 mL/min/{1.73_m2} (ref >=60)
EGFR CKD-EPI NON-AA MALE: 85 mL/min/{1.73_m2} (ref >=60)
GLUCOSE RANDOM: 90 mg/dL (ref 65–179)
POTASSIUM: 3.8 mmol/L (ref 3.5–5.0)
PROTEIN TOTAL: 6.3 g/dL — ABNORMAL LOW (ref 6.5–8.3)
SODIUM: 141 mmol/L (ref 135–145)

## 2018-08-12 LAB — TESTOSTERONE TOTAL: Testosterone:MCnc:Pt:Ser/Plas:Qn:: 27 — ABNORMAL LOW

## 2018-08-12 LAB — PROSTATE SPECIFIC ANTIGEN: Prostate specific Ag:MCnc:Pt:Ser/Plas:Qn:: 15.5 — ABNORMAL HIGH

## 2018-08-12 LAB — CALCIUM: Calcium:MCnc:Pt:Ser/Plas:Qn:: 9.1

## 2018-08-12 LAB — LYMPHOCYTES ABSOLUTE COUNT: Lab: 2.3

## 2018-08-12 NOTE — Unmapped (Signed)
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  I previously asked Radiation Oncology to weigh in about potential RT to the lymph nodes, and they felt this would not be beneficial and would confer toxicity without likely long-term disease control.  He has been asymptomatic.  He has had RT to the prostate in the past.  He is here today to consider options and is being screened by genetics for testing, and being considered for clinical trial.  Other options are enzalutamide, docetaxel, Provenge.    Plan:  - Continue Lupron every three months (07/15/18).  - Discontinued abiraterone/prednisone.Will return to [employer].  - Will continue BP meds started w abiraterone but will discontinue if BP falls below 110 or symptomatic.  - Screening for clinical trials - TRITON and PROSTAR.  - Clinical genetics saw today, will do germline testing/testing for BRCA.  - Fatigue on abiraterone - he does not want to try ritalin currently but might consider in the future.  - Follow PSA.  - Follow LFTs - today pending.  - Blood pressure control with his PCP and followed by [facility].  - Nocturia: persistent, does not want Flomax.   - Anxiety: On Lexapro - Pharmacy ran interactions for both olaparib and abiraterone.  - Smoking: He understands risks, and the [place] smoking cessation program here has been meeting with him for counseling.  - Bone density: Checked again in 6/19 and although only mildly low, had 10% loss in bone density from 2016-2019 on Lupron and now prednisone.  Starting Fosamax, calcium/D.  Follow up for Lupron 09/23/18 and as needed for assessment of clinical trial eligibility and initiation either of trial or treatment off trial.    -------------------------------------------------------    Referring physician:  Riki Altes, MD [facility])  Other physician:  Irven Easterly, MD    CC: Prostate cancer to lymph nodes and possibly bone s/p IMRT to prostate, with elevated PSA, on Lupron    Current therapy: Lupron    HPI:  2010: IMRT to prostate for T1c prostate adenocarcinoma, biopsy with up to Gleason 4+3=7.  Subsequent PSA rise with negative saturation biopsy on 11/17/12.  - 04/05/13: 8.3  - 08/29/13: 12.8  - 02/09/14: 13.3  10/2013: MRI with enlarged left obturator, iliac and para-aortic nodes up to 3 cm consistent with metastatic disease. Bilateral ischia and L pelvis with bony areas <32mm of unclear etiology (bone islands vs. metastases).  10/2013: Lupron started  Subsequent PSA:  - 05/01/14: 1.4  - 08/02/14: 1.4  - 11/03/14: 1.7  01/18/16: Bone scan: no metastases.  01/18/16: CT A/P: 2.6 cm left external iliac chain hyperenhancing node (series 6, image 57). Several prominent subcentimeter lymph nodes are seen along the left para-aortic retroperitoneum.  Prostate is enlarged measuring 6.0 x 6.7 x 9.3 cm. [Possible lipoma in gluteus maximus, or could be related to Lupron injection, discussed w urology, no action warranted.]  10/16/16: restaging:   - CT CAP: Interval increase in size of left external and common iliac chain as well as periaortic lymph nodes, worrisome for metastatic disease.   - Bone Scan: No evidence of osseous metastatic disease.  5/18: Abiraterone/prednisone  6/19: BMD (c/w 2016): Lumbar spine: ??Normal bone density. ??The measurement has decreased significantly since prior study.  Left proximal femur: Mildly low bone density. ??The measurement has decreased significantly since prior study.  04/15/18: PSA 10.6  08/04/18: PSA  rising on abiraterone, now 16.7  08/04/18: Bone scan: No evidence of osseous metastasis.  08/04/18: CT CAP: Interval increase in external iliac, periaortic, and aortocaval lymphadenopathy concerning for disease progression.      ROS: Nocturia: every 2-3 hours but drinks tea before bed, doesn't bother him.  No pain.  Fatigue.  Good appetite.  Has tolerated Lupron well.  Remainder of 10 system ROS negative.    PE  Temp 36.9 ??C (98.4 ??F) (Oral)  - Ht 172.7 cm (5' 8)  - Wt 81.3 kg (179 lb 4.8 oz)  - BMI 27.26 kg/m??   NAD  A+Ox3  No rash  No LEE  No cords  Neuro non focal  CTA  RRR  Abd NT ND    Current Outpatient Medications   Medication Sig Dispense Refill   ??? abiraterone (ZYTIGA) 250 mg Tab tablet TAKE 4 TABLETS BY MOUTH ONCE DAILY 120 each 6   ??? alendronate (FOSAMAX) 70 MG tablet Take 1 tablet (70 mg total) by mouth every seven (7) days. 12.85 tablet 3   ??? amLODIPine-benazepril (LOTREL 5-20) 5-20 mg per capsule Take 1 capsule by mouth daily.     ??? ascorbic acid, vitamin C, (VITAMIN C) 250 MG tablet Take 250 mg by mouth.     ??? aspirin (ECOTRIN) 81 MG tablet Take 81 mg by mouth.     ??? calcium carbonate (OS-CAL) 600 mg calcium (1,500 mg) tablet Take 1 tablet by mouth daily.     ??? escitalopram oxalate (LEXAPRO) 10 MG tablet Take 1 tablet (10 mg total) by mouth daily. 90 tablet 1   ??? leuprolide (LUPRON) 22.5 mg injection Inject 22.5 mg into the muscle Every three (3) months.     ??? multivitamin (THERAGRAN) per tablet Take 1 tablet by mouth daily.     ??? predniSONE (DELTASONE) 5 MG tablet TAKE 2 TABLETS BY MOUTH ONCE DAILY 60 tablet 6     No current facility-administered medications for this visit.      Allergies   Allergen Reactions   ??? Valium [Diazepam] Other (See Comments)     Hallucinations, altered mental status  Hallucinations, altered mental status           Lab on 08/04/2018   Component Date Value Ref Range Status   ??? PSA 08/04/2018 16.70* 0.00 - 4.00 ng/mL Final   ??? Sodium 08/04/2018 139  135 - 145 mmol/L Final   ??? Potassium 08/04/2018 3.8  3.5 - 5.0 mmol/L Final   ??? Chloride 08/04/2018 104  98 - 107 mmol/L Final   ??? CO2 08/04/2018 28.0  22.0 - 30.0 mmol/L Final   ??? BUN 08/04/2018 11  7 - 21 mg/dL Final   ??? Creatinine 08/04/2018 0.78  0.70 - 1.30 mg/dL Final   ??? BUN/Creatinine Ratio 08/04/2018 14   Final   ??? EGFR CKD-EPI Non-African American,* 08/04/2018 85  >=60 mL/min/1.25m2 Final   ??? EGFR CKD-EPI African American, Male 08/04/2018 >90  >=60 mL/min/1.59m2 Final   ??? Glucose 08/04/2018 92  65 - 179 mg/dL Final   ??? Calcium 16/06/9603 9.3  8.5 - 10.2 mg/dL Final   ??? Albumin 54/05/8118 3.7  3.5 - 5.0 g/dL Final   ??? Total Protein 08/04/2018 6.4* 6.5 - 8.3 g/dL Final   ??? Total Bilirubin 08/04/2018 0.6  0.0 - 1.2 mg/dL Final   ??? AST 14/78/2956 16* 19 - 55 U/L Final   ??? ALT 08/04/2018 14  <50 U/L Final   ??? Alkaline Phosphatase 08/04/2018 60  38 - 126 U/L Final   ??? Anion Gap 08/04/2018 7  7 - 15 mmol/L Final   ??? WBC 08/04/2018 11.7* 4.5 - 11.0 10*9/L Final   ??? RBC 08/04/2018 4.32* 4.50 - 5.90 10*12/L Final   ??? HGB 08/04/2018 14.2  13.5 - 17.5 g/dL Final   ??? HCT 60/45/4098 42.8  41.0 - 53.0 % Final   ??? MCV 08/04/2018 98.9  80.0 - 100.0 fL Final   ??? MCH 08/04/2018 33.0  26.0 - 34.0 pg Final   ??? MCHC 08/04/2018 33.3  31.0 - 37.0 g/dL Final   ??? RDW 11/91/4782 14.6  12.0 - 15.0 % Final   ??? MPV 08/04/2018 8.2  7.0 - 10.0 fL Final   ??? Platelet 08/04/2018 334  150 - 440 10*9/L Final   ??? Neutrophils % 08/04/2018 69.9  % Final   ??? Lymphocytes % 08/04/2018 21.0  % Final   ??? Monocytes % 08/04/2018 5.1  % Final   ??? Eosinophils % 08/04/2018 1.8  % Final   ??? Basophils % 08/04/2018 0.5  % Final   ??? Absolute Neutrophils 08/04/2018 8.2* 2.0 - 7.5 10*9/L Final   ??? Absolute Lymphocytes 08/04/2018 2.5  1.5 - 5.0 10*9/L Final   ??? Absolute Monocytes 08/04/2018 0.6  0.2 - 0.8 10*9/L Final   ??? Absolute Eosinophils 08/04/2018 0.2  0.0 - 0.4 10*9/L Final   ??? Absolute Basophils 08/04/2018 0.1  0.0 - 0.1 10*9/L Final   ??? Large Unstained Cells 08/04/2018 2  0 - 4 % Final   ??? 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  I previously asked Radiation Oncology to weigh in about potential RT to the lymph nodes, and they felt this would not be beneficial and would confer toxicity without likely long-term disease control.  He has been asymptomatic.  He has had RT to the prostate in the past.      Plan:  - Continue Lupron every three months (04/15/18).  - Continue abiraterone/prednisone.  - Fatigue on abiraterone - he does not want to try ritalin currently but might consider in the future.  - Follow PSA.  - Follow LFTs - today pending.  - Blood pressure control with his PCP.  - Nocturia: persistent, does not want Flomax.   - Anxiety: On Lexapro - Pharmacy ran interactions for both olaparib and abiraterone.  - Smoking: He understands risks, and the [place] smoking cessation program here has been meeting with him for counseling.  - Bone density: Checked again in 6/19 and although only mildly low, had 10% loss in bone density from 2016-2019 on Lupron and now prednisone.  Starting Fosamax, calcium/D.  Follow up for Lupron and monitoring on abiraterone.    -------------------------------------------------------    Referring physician:  Riki Altes, MD [facility])  Other physician:  Irven Easterly, MD    CC: Prostate cancer to lymph nodes and possibly bone s/p IMRT to prostate, with elevated PSA, on Lupron    Current therapy: Lupron  HPI:  2010: IMRT to prostate for T1c prostate adenocarcinoma, biopsy with up to Gleason 4+3=7.  Subsequent PSA rise with negative saturation biopsy on 11/17/12.  - 04/05/13: 8.3  - 08/29/13: 12.8  - 02/09/14: 13.3  10/2013: MRI with enlarged left obturator, iliac and para-aortic nodes up to 3 cm consistent with metastatic disease. Bilateral ischia and L pelvis with bony areas <67mm of unclear etiology (bone islands vs. metastases).  10/2013: Lupron started  Subsequent PSA:  - 05/01/14: 1.4  - 08/02/14: 1.4  - 11/03/14: 1.7  01/18/16: Bone scan: no metastases.  01/18/16: CT A/P: 2.6 cm left external iliac chain hyperenhancing node (series 6, image 57). Several prominent subcentimeter lymph nodes are seen along the left para-aortic retroperitoneum.  Prostate is enlarged measuring 6.0 x 6.7 x 9.3 cm. [Possible lipoma in gluteus maximus, or could be related to Lupron injection, discussed w urology, no action warranted.]  10/16/16: restaging:   - CT CAP: Interval increase in size of left external and common iliac chain as well as periaortic lymph nodes, worrisome for metastatic disease.   - Bone Scan: No evidence of osseous metastatic disease.  5/18: Abiraterone/prednisone  6/19: BMD (c/w 2016): Lumbar spine: ??Normal bone density. ??The measurement has decreased significantly since prior study.  Left proximal femur: Mildly low bone density. ??The measurement has decreased significantly since prior study.    ROS: Nocturia: every 2-3 hours but drinks tea before bed, doesn't bother him.  No pain.  Fatigue.  Good appetite.  Has tolerated Lupron well.  Remainder of 10 system ROS negative.    PE  Temp 36.9 ??C (98.4 ??F) (Oral)  - Ht 172.7 cm (5' 8)  - Wt 81.3 kg (179 lb 4.8 oz)  - BMI 27.26 kg/m??   NAD  A+Ox3  No rash  No LEE  No cords  Neuro non focal  CTA  RRR  Abd NT ND    Current Outpatient Medications   Medication Sig Dispense Refill   ??? abiraterone (ZYTIGA) 250 mg Tab tablet TAKE 4 TABLETS BY MOUTH ONCE DAILY 120 each 6   ??? alendronate (FOSAMAX) 70 MG tablet Take 1 tablet (70 mg total) by mouth every seven (7) days. 12.85 tablet 3   ??? amLODIPine-benazepril (LOTREL 5-20) 5-20 mg per capsule Take 1 capsule by mouth daily.     ??? ascorbic acid, vitamin C, (VITAMIN C) 250 MG tablet Take 250 mg by mouth.     ??? aspirin (ECOTRIN) 81 MG tablet Take 81 mg by mouth.     ??? calcium carbonate (OS-CAL) 600 mg calcium (1,500 mg) tablet Take 1 tablet by mouth daily.     ??? escitalopram oxalate (LEXAPRO) 10 MG tablet Take 1 tablet (10 mg total) by mouth daily. 90 tablet 1   ??? leuprolide (LUPRON) 22.5 mg injection Inject 22.5 mg into the muscle Every three (3) months.     ??? multivitamin (THERAGRAN) per tablet Take 1 tablet by mouth daily.     ??? predniSONE (DELTASONE) 5 MG tablet TAKE 2 TABLETS BY MOUTH ONCE DAILY 60 tablet 6     No current facility-administered medications for this visit.      Allergies   Allergen Reactions   ??? Valium [Diazepam] Other (See Comments)     Hallucinations, altered mental status  Hallucinations, altered mental status           Lab on 08/04/2018   Component Date Value Ref Range Status   ??? PSA 08/04/2018 16.70* 0.00 - 4.00 ng/mL Final   ???  Sodium 08/04/2018 139  135 - 145 mmol/L Final   ??? Potassium 08/04/2018 3.8  3.5 - 5.0 mmol/L Final   ??? Chloride 08/04/2018 104  98 - 107 mmol/L Final   ??? CO2 08/04/2018 28.0  22.0 - 30.0 mmol/L Final   ??? BUN 08/04/2018 11  7 - 21 mg/dL Final   ??? Creatinine 08/04/2018 0.78  0.70 - 1.30 mg/dL Final   ??? BUN/Creatinine Ratio 08/04/2018 14   Final   ??? EGFR CKD-EPI Non-African American,* 08/04/2018 85  >=60 mL/min/1.30m2 Final   ??? EGFR CKD-EPI African American, Male 08/04/2018 >90  >=60 mL/min/1.64m2 Final   ??? Glucose 08/04/2018 92  65 - 179 mg/dL Final   ??? Calcium 96/12/5407 9.3  8.5 - 10.2 mg/dL Final   ??? Albumin 81/19/1478 3.7  3.5 - 5.0 g/dL Final   ??? Total Protein 08/04/2018 6.4* 6.5 - 8.3 g/dL Final   ??? Total Bilirubin 08/04/2018 0.6  0.0 - 1.2 mg/dL Final   ??? AST 29/56/2130 16* 19 - 55 U/L Final   ??? ALT 08/04/2018 14  <50 U/L Final   ??? Alkaline Phosphatase 08/04/2018 60  38 - 126 U/L Final   ??? Anion Gap 08/04/2018 7  7 - 15 mmol/L Final   ??? WBC 08/04/2018 11.7* 4.5 - 11.0 10*9/L Final   ??? RBC 08/04/2018 4.32* 4.50 - 5.90 10*12/L Final   ??? HGB 08/04/2018 14.2  13.5 - 17.5 g/dL Final   ??? HCT 86/57/8469 42.8  41.0 - 53.0 % Final   ??? MCV 08/04/2018 98.9  80.0 - 100.0 fL Final   ??? MCH 08/04/2018 33.0  26.0 - 34.0 pg Final   ??? MCHC 08/04/2018 33.3  31.0 - 37.0 g/dL Final   ??? RDW 62/95/2841 14.6  12.0 - 15.0 % Final   ??? MPV 08/04/2018 8.2  7.0 - 10.0 fL Final   ??? Platelet 08/04/2018 334  150 - 440 10*9/L Final   ??? Neutrophils % 08/04/2018 69.9  % Final   ??? Lymphocytes % 08/04/2018 21.0  % Final   ??? Monocytes % 08/04/2018 5.1  % Final   ??? Eosinophils % 08/04/2018 1.8  % Final   ??? Basophils % 08/04/2018 0.5  % Final   ??? Absolute Neutrophils 08/04/2018 8.2* 2.0 - 7.5 10*9/L Final   ??? Absolute Lymphocytes 08/04/2018 2.5  1.5 - 5.0 10*9/L Final   ??? Absolute Monocytes 08/04/2018 0.6  0.2 - 0.8 10*9/L Final   ??? Absolute Eosinophils 08/04/2018 0.2  0.0 - 0.4 10*9/L Final   ??? Absolute Basophils 08/04/2018 0.1  0.0 - 0.1 10*9/L Final   ??? Large Unstained Cells 08/04/2018 2  0 - 4 % Final   ??? Macrocytosis 08/04/2018 Slight* Not Present Final       Lab Results   Component Value Date    PSA 16.70 (H) 08/04/2018    PSA 14.50 (H) 07/15/2018    PSA 10.60 (H) 04/15/2018    PSA 7.80 (H) 01/07/2018    PSA 5.72 (H) 10/01/2017    PSA 4.08 (H) 05/28/2017

## 2018-08-12 NOTE — Unmapped (Signed)
Nice seeing you today.   If you have any questions please call the Nurse Navigator in the office at [phone], or [phone]) [phone].  Frederic Jericho, MD

## 2018-08-12 NOTE — Unmapped (Signed)
Clinical Study: A Phase 1b/2 Study of CPI-1205, a Small Molecule Inhibitor of EZH2, Combined with Enzalutamide or Abiraterone/Prednisone in Patients with Metastatic Castration Resistant Prostate Cancer  IRB#: [id number]       Narrative: Spoke with patient and his wife regarding the CPI-1205-[employer]) study. Explained that in the consent we are at Part B, reviewed the purpose of the study, schedule of events, side effects and patient responsibility while on study. Patient is interested in study participation and will await the for ctDNA drawn for TRITON 3. Will contact patient after results. Patient provided copy of consent, contact information and will contact study coordinator if has any questions.     Plan: Study coordinator will follow up in a few weeks.     Ezzard Flax, [place], CCRP  [phone]

## 2018-08-12 NOTE — Unmapped (Signed)
Addended by: Orie Fisherman on: 08/12/2018 09:41 AM     Modules accepted: Orders

## 2018-08-12 NOTE — Unmapped (Signed)
Protocol/Clinical Study: CO-338-063/TRITON3: A Multicenter, Randomized, Open-label Phase 3 Study of Rucaparib versus Physician???s Choice of Therapy for Patients with Metastatic Castration-resistant Prostate Cancer Associated with Homologous Recombination Deficiency     Study Treatment: Rucaparib (Rubraca) vs Physicians Choice      Cycle / Day: Signed Pre-Screen Consent ([id number])    Will return on 22Nov2019 at 10am to sign HIPPA authorization and take home copies of signed  documents.   Performance Status: n/a        Narrative: Patient in clinic on 21Nov2019 at 8:40am to discuss pre-screen consent for CO-338-063 Triton3 clinical trial. The protocol was reviewed by study coordinator with patient and patient???s wife in a private room. The purpose of the study was briefly discussed along with, risks and benefits, medication, treatments, study procedures, time commitment, cost of participation, and the voluntary nature of the trial. The patient was given time to voice any questions or concerns to which study coordinator addressed. Dr. Vernell Barrier was also available throughout the patient???s visit to address questions.      Today patient felt comfortable enough to sign the pre-screen consent.      Plan: After consent was signed ctDNA tubes were collected (approximately 10:25am) per protocol for genetic sequencing. Study coordinator will contact patient when genetic testing results are provided from [facility] to determine next potential steps on trial.      John Watkins  Study Coordinator  Pager #: [phone]

## 2018-08-16 NOTE — Unmapped (Addendum)
Referring Provider:   Referred Self    Primary Provider:   Ruel Favors, MD    Reason for Visit:   Diagnosis ICD-10-CM Associated Orders   1. Prostate cancer metastatic to bone (CMS-HCC) C61     C79.51        HISTORY OF PRESENT ILLNESS: John Watkins is an 82 y.o. male with a personal history of metastatic prostate cancer.  He was referred to the [facility] to discuss the possibility of an hereditary predisposition to cancer, genetic testing, and to further clarify his future cancer risks, as well as potential cancer risk for family members due to his personal history of metastatic prostate cancer.  John Watkins is accompanied to clinic by his wife. .  The history is collected from available records in Epic, records provided by the referring provider, and verbal report of John Watkins.       No history exists.      Medical history relevant to hereditary cancer evaluation includes:  1. John Watkins was diagnosed with prostate cancer in 2010 and now has metastatic disease.     Past Medical History:   Diagnosis Date   ??? Elevated prostate specific antigen (PSA)    ??? Hypertrophy of prostate with urinary obstruction and other lower urinary tract symptoms (LUTS)    ??? Malignant neoplasm of prostate (CMS-HCC)    ??? Nocturia    ??? Other specified retention of urine    ??? Urgency of urination    ??? Urinary frequency    ??? Urinary tract infection, site not specified        No past surgical history on file.    PSYCHOSOCIAL HISTORY: John Watkins lives with his wife.  He is retired.     FAMILY HISTORY:   During the visit, a 4-generation pedigree was obtained.    Children: One daughter, John Watkins who is 52yo. John Watkins has one daughter, John Watkins who has two children of her own.     Siblings: Brother, John Watkins is 39, healthy, alive with no cancer. John Watkins has three children in their 30-40s, all alive and well with no cancers.     Mother and maternal relatives: Mother died at 74 of dilated cardiomyopathy status post rheumatic fever as a child. No known information about the rest of the maternal relatives.     Father and paternal relatives: Father, John Watkins died at 29 from myocardial infarction. No known cancers. No other paternal family history available.     John Watkins's maternal family is Caucasian.  The paternal family is Caucasian.  Ashkenazi Jewish ancestry is denied for both maternal and paternal families.    ASSESSMENT: John Watkins is an 82 y.o. male referred to the [facility] due to a personal history of metastatic prostate cancer.       GENETIC TESTING: We discussed the methodology and potential implications of the following genetic test(s):  1. [employer] Prostate cancer guidelines based panel    We reviewed potential adverse reactions to the genetic test results and state and federal laws protecting against genetic discrimination in health insurance and employment.     DISCUSSION: We reviewed the characteristics and inheritance patterns of hereditary cancer syndromes. We discussed genetic testing, including the appropriate family members to test, the process of testing, and insurance coverage. We also discussed the implications of a positive, negative, and/or variant of uncertain significance genetic test result.      After careful consideration of the benefits and limitations of genetic testing, Mr.  Watkins decided to pursue testing. A blood sample was obtained and sent to [facility] for next-generation sequencing and deletion/duplication analysis of 12 genes implicated in prostate cancer. John Watkins stated that he wishes to discuss results via phone. We will discuss risk management in more detail during the results disclosure, as appropriate according to results of genetic testing.    PLAN:   1. A blood sample will be sent to [facility] for analysis of 12 genes associated with prostate cancer risk.  We expect the result in about 3 weeks and will call the patient at that time.  2. If no variant is identified, we will ask that the patient remain in contact with [place] so that we can update the patient's family history and ensure access to the most up-to-date genetic testing that is appropriate.    3. If a variant is identified, we will arrange to discuss with the patient in detail the implications for the patient and family members.      We encouraged John Watkins to remain in contact with cancer genetics annually so that we can continuously update the family history and inform him of any changes in cancer genetics and testing that may be of benefit for this family. Mr.  Watkins's questions were answered to his satisfaction today. Our contact information was provided should additional questions or concerns arise.     Thank you for the referral and allowing [place] to share in the care of your patient.     Allean Found, MD  Clinical Genetics Fellow  Department of Genetics      This patient was discussed with Dr. Dahlia Client who agrees with the above assessment and plan. Total time spent by Allean Found, MD in face-to-face counseling was approximately 15 minutes.       ATTESTATION:  I was the supervising physician in the delivery of the service and was available if any questions arose.  I agree with the assessment and plan as documented in the genetic resident's note and as edited by me.    Audree Camel, M.D.  Medical Geneticist

## 2018-08-31 NOTE — Unmapped (Signed)
I was unable to reach John Watkins this afternoon. I left a message with my number for him to call me back. If I do not hear back from him today I will call him tomorrow.

## 2018-09-01 NOTE — Unmapped (Signed)
Informed patient that the [facility] Medicine report indicates he does not have the required mutation to enroll in the Triton 3 clinical trial. I also let him know that my colleague, Ezzard Flax, would reach out to him regarding a different clinical trial for which he may be eligible.

## 2018-09-29 ENCOUNTER — Ambulatory Visit: Admit: 2018-09-29 | Discharge: 2018-09-30 | Payer: MEDICARE

## 2018-09-29 ENCOUNTER — Other Ambulatory Visit: Admit: 2018-09-29 | Discharge: 2018-09-30 | Payer: MEDICARE

## 2018-09-29 DIAGNOSIS — C775 Secondary and unspecified malignant neoplasm of intrapelvic lymph nodes: Secondary | ICD-10-CM

## 2018-09-29 DIAGNOSIS — C61 Malignant neoplasm of prostate: Secondary | ICD-10-CM

## 2018-09-30 ENCOUNTER — Ambulatory Visit: Admit: 2018-09-30 | Discharge: 2018-10-02 | Payer: MEDICARE

## 2018-09-30 ENCOUNTER — Ambulatory Visit: Admit: 2018-09-30 | Discharge: 2018-10-12 | Payer: MEDICARE

## 2018-09-30 ENCOUNTER — Ambulatory Visit: Admit: 2018-09-30 | Discharge: 2018-10-12 | Payer: MEDICARE | Attending: Medical Oncology | Primary: Medical Oncology

## 2018-09-30 DIAGNOSIS — R351 Nocturia: Secondary | ICD-10-CM

## 2018-09-30 DIAGNOSIS — R609 Edema, unspecified: Secondary | ICD-10-CM

## 2018-09-30 DIAGNOSIS — C775 Secondary and unspecified malignant neoplasm of intrapelvic lymph nodes: Secondary | ICD-10-CM

## 2018-09-30 DIAGNOSIS — C61 Malignant neoplasm of prostate: Principal | ICD-10-CM

## 2018-09-30 LAB — PROSTATE SPECIFIC ANTIGEN: Prostate specific Ag:MCnc:Pt:Ser/Plas:Qn:: 19.8 — ABNORMAL HIGH

## 2018-09-30 MED ORDER — ENZALUTAMIDE 40 MG CAPSULE
ORAL_CAPSULE | Freq: Every day | ORAL | 6 refills | 0.00000 days | Status: CP
Start: 2018-09-30 — End: 2018-09-30
  Filled 2018-10-12: qty 120, 30d supply, fill #0

## 2018-09-30 MED ORDER — ENZALUTAMIDE 40 MG CAPSULE: 160 mg | capsule | Freq: Every day | 6 refills | 30 days | Status: AC

## 2018-09-30 MED ORDER — TAMSULOSIN 0.4 MG CAPSULE
ORAL_CAPSULE | Freq: Every day | ORAL | 3 refills | 0 days | Status: CP
Start: 2018-09-30 — End: 2019-09-30

## 2018-09-30 NOTE — Unmapped (Signed)
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  I previously asked Radiation Oncology to weigh in about potential RT to the lymph nodes, and they felt this would not be beneficial and would confer toxicity without likely long-term disease control.  He has been asymptomatic.  He has had RT to the prostate in the past.  He is here today to consent for clinical trial CPI-1201-201 ([facility]).  Other options include enzalutamide, docetaxel, Provenge.    Plan:  - Continue Lupron every three months (last on 07/15/18).  - Consented today for CPI-1201-201 [facility]) trial, will start enzalutamide and be randomized to study drug or not (unblinded).  - Lower extremity swelling after trauma with a wood board during a project; coincident with discontinuing abiraterone/prednisone, so etiology unclear.  Will check LE dopplers today to r/o clot, and if negative he will try compression stockings.  - Will continue BP meds started w abiraterone but will discontinue if BP falls below 110 or symptomatic.  - Clinical genetics saw him, negative genomic screening.  - Fatigue on abiraterone - he does not want to try ritalin currently but might consider in the future.  - Follow PSA.  - Follow LFTs - today pending.  - Blood pressure control with his PCP and followed by [facility].  - Nocturia: persistent, does not want Flomax.   - Anxiety: On Lexapro - Pharmacy ran interactions for both olaparib and abiraterone.  - Smoking: He understands risks, and the [place] smoking cessation program here has been meeting with him for counseling.  - Bone density: Checked again in 6/19 and although only mildly low, had 10% loss in bone density from 2016-2019 on Lupron and now prednisone.  On Fosamax, calcium/D.    Follow up for Lupron in 2 weeks, restaging for trial. and initiation of clinical trial. -------------------------------------------------------    Referring physician:  Riki Altes, MD [facility])  Other physician:  Irven Easterly, MD    CC: Prostate cancer to lymph nodes and possibly bone s/p IMRT to prostate, with elevated PSA, on Lupron    Current therapy: Lupron    HPI:  2010: IMRT to prostate for T1c prostate adenocarcinoma, biopsy with up to Gleason 4+3=7.  Subsequent PSA rise with negative saturation biopsy on 11/17/12.  - 04/05/13: 8.3  - 08/29/13: 12.8  - 02/09/14: 13.3  10/2013: MRI with enlarged left obturator, iliac and para-aortic nodes up to 3 cm consistent with metastatic disease. Bilateral ischia and L pelvis with bony areas <20mm of unclear etiology (bone islands vs. metastases).  10/2013: Lupron started  Subsequent PSA:  - 05/01/14: 1.4  - 08/02/14: 1.4  - 11/03/14: 1.7  01/18/16: Bone scan: no metastases.  01/18/16: CT A/P: 2.6 cm left external iliac chain hyperenhancing node (series 6, image 57). Several prominent subcentimeter lymph nodes are seen along the left para-aortic retroperitoneum.  Prostate is enlarged measuring 6.0 x 6.7 x 9.3 cm. [Possible lipoma in gluteus maximus, or could be related to Lupron injection, discussed w urology, no action warranted.]  10/16/16: restaging:   - CT CAP: Interval increase in size of left external and common iliac chain as well as periaortic lymph nodes, worrisome for metastatic disease.   - Bone Scan: No evidence of osseous metastatic disease.  5/18: Abiraterone/prednisone  6/19: BMD (c/w 2016): Lumbar spine: ??Normal bone density. ??The measurement has decreased significantly since prior study.  Left proximal  femur: Mildly low bone density. ??The measurement has decreased significantly since prior study.  04/15/18: PSA 10.6  08/04/18: PSA rising on abiraterone, now 16.7  08/04/18: Bone scan: No evidence of osseous metastasis.  08/04/18: CT CAP: Interval increase in external iliac, periaortic, and aortocaval lymphadenopathy concerning for disease progression.      ROS: Nocturia: every 2-3 hours but drinks tea before bed, doesn't bother him.  No pain.  Fatigue.  Good appetite.  Has tolerated Lupron well.  Remainder of 10 system ROS negative.    PE  ECOG 0  BP 158/72  - Pulse 62  - Temp 35.1 ??C (95.2 ??F) (Oral)  - Resp 18  - Ht 172.7 cm (5' 8)  - Wt 80.8 kg (178 lb 3.2 oz)  - SpO2 98%  - BMI 27.10 kg/m??   NAD  A+Ox3  No rash  No LEE  No cords  Neuro non focal  CTA  RRR  Abd NT ND    Current Outpatient Medications   Medication Sig Dispense Refill   ??? alendronate (FOSAMAX) 70 MG tablet Take 1 tablet (70 mg total) by mouth every seven (7) days. 12.85 tablet 3   ??? amLODIPine-benazepril (LOTREL 5-20) 5-20 mg per capsule Take 1 capsule by mouth daily.     ??? ascorbic acid, vitamin C, (VITAMIN C) 250 MG tablet Take 250 mg by mouth.     ??? aspirin (ECOTRIN) 81 MG tablet Take 81 mg by mouth.     ??? calcium carbonate (OS-CAL) 600 mg calcium (1,500 mg) tablet Take 1 tablet by mouth daily.     ??? escitalopram oxalate (LEXAPRO) 10 MG tablet Take 1 tablet (10 mg total) by mouth daily. 90 tablet 1   ??? leuprolide (LUPRON) 22.5 mg injection Inject 22.5 mg into the muscle Every three (3) months.     ??? multivitamin (THERAGRAN) per tablet Take 1 tablet by mouth daily.     ??? abiraterone (ZYTIGA) 250 mg Tab tablet TAKE 4 TABLETS BY MOUTH ONCE DAILY (Patient not taking: Reported on 09/30/2018) 120 each 6   ??? predniSONE (DELTASONE) 5 MG tablet TAKE 2 TABLETS BY MOUTH ONCE DAILY (Patient not taking: Reported on 09/30/2018) 60 tablet 6     No current facility-administered medications for this visit.      Allergies   Allergen Reactions   ??? Valium [Diazepam] Other (See Comments)     Hallucinations, altered mental status  Hallucinations, altered mental status           Orders Only on 09/29/2018   Component Date Value Ref Range Status   ??? EKG Ventricular Rate 09/29/2018 54  BPM Incomplete   ??? EKG Atrial Rate 09/29/2018 54  BPM Incomplete   ??? EKG P-R Interval 09/29/2018 234  ms Incomplete   ??? EKG QRS Duration 09/29/2018 90  ms Incomplete   ??? EKG Q-T Interval 09/29/2018 478  ms Incomplete   ??? EKG QTC Calculation 09/29/2018 453  ms Incomplete   ??? EKG Calculated P Axis 09/29/2018 74  degrees Incomplete   ??? EKG Calculated R Axis 09/29/2018 -57  degrees Incomplete   ??? EKG Calculated T Axis 09/29/2018 46  degrees Incomplete   ??? QTC Fredericia 09/29/2018 461  ms Incomplete   Lab on 09/29/2018   Component Date Value Ref Range Status   ??? LDH 09/29/2018 540  338 - 610 U/L Final   ??? Sodium 09/29/2018 138  135 - 145 mmol/L Final   ??? Potassium 09/29/2018 3.8  3.5 -  5.0 mmol/L Final   ??? Chloride 09/29/2018 103  98 - 107 mmol/L Final   ??? CO2 09/29/2018 27.0  22.0 - 30.0 mmol/L Final   ??? BUN 09/29/2018 12  7 - 21 mg/dL Final   ??? Creatinine 09/29/2018 0.79  0.70 - 1.30 mg/dL Final   ??? BUN/Creatinine Ratio 09/29/2018 15   Final   ??? EGFR CKD-EPI Non-African American,* 09/29/2018 85  >=60 mL/min/1.35m2 Final   ??? EGFR CKD-EPI African American, Male 09/29/2018 >90  >=60 mL/min/1.24m2 Final   ??? Glucose 09/29/2018 98  70 - 179 mg/dL Final   ??? Calcium 95/62/1308 9.3  8.5 - 10.2 mg/dL Final   ??? Albumin 65/78/4696 3.8  3.5 - 5.0 g/dL Final   ??? Total Protein 09/29/2018 6.9  6.5 - 8.3 g/dL Final   ??? Total Bilirubin 09/29/2018 0.4  0.0 - 1.2 mg/dL Final   ??? AST 29/52/8413 21  19 - 55 U/L Final   ??? ALT 09/29/2018 15  <50 U/L Final   ??? Alkaline Phosphatase 09/29/2018 67  38 - 126 U/L Final   ??? Anion Gap 09/29/2018 8  7 - 15 mmol/L Final   ??? Uric Acid 09/29/2018 3.5* 4.0 - 9.0 mg/dL Final   ??? Magnesium 24/40/1027 1.8  1.6 - 2.2 mg/dL Final   ??? Phosphorus 09/29/2018 3.8  2.9 - 4.7 mg/dL Final   ??? PT 25/36/6440 12.5  10.2 - 13.1 sec Final   ??? INR 09/29/2018 1.09   Final   ??? APTT 09/29/2018 33.9  25.9 - 39.5 sec Final   ??? Heparin Correlation 09/29/2018 0.2   Final    NOTE: This result is for use only with patients receiving heparin therapy in conjunction with the Heparin Nomogram.  In patients not receiving heparin, an elevated aPTT does not always imply anticoagulation.   ??? TSH 09/29/2018 3.749* 0.600 - 3.300 uIU/mL Final   ??? Free T4 09/29/2018 1.05  0.71 - 1.40 ng/dL Final   ??? Testosterone 09/29/2018 <5* 179 - 756 ng/dL Final   ??? Prolactin 34/74/2595 6.0  4.0 - 18.0 ng/mL Final   ??? LH 09/29/2018 <0.5  mIU/mL Final   ??? FSH 09/29/2018 3.7  mIU/mL Final   ??? Estradiol 09/29/2018 <20.0  pg/mL Final    Reference Ranges:  Serum Estrogen  (pg/ml)   Male: 5-66   Male (Postmenopausal): 5-38   Male (Ovulating):  (pg/ml)          follicular phase 27-161          periovulatory 187-382          luteal phase 33-201    ??? WBC 09/29/2018 8.7  4.5 - 11.0 10*9/L Final   ??? RBC 09/29/2018 4.15* 4.50 - 5.90 10*12/L Final   ??? HGB 09/29/2018 13.3* 13.5 - 17.5 g/dL Final   ??? HCT 63/87/5643 40.9* 41.0 - 53.0 % Final   ??? MCV 09/29/2018 98.7  80.0 - 100.0 fL Final   ??? MCH 09/29/2018 32.2  26.0 - 34.0 pg Final   ??? MCHC 09/29/2018 32.6  31.0 - 37.0 g/dL Final   ??? RDW 32/95/1884 16.5* 12.0 - 15.0 % Final   ??? MPV 09/29/2018 7.8  7.0 - 10.0 fL Final   ??? Platelet 09/29/2018 495* 150 - 440 10*9/L Final   ??? Neutrophils % 09/29/2018 55.3  % Final   ??? Lymphocytes % 09/29/2018 31.9  % Final   ??? Monocytes % 09/29/2018 6.4  % Final   ??? Eosinophils % 09/29/2018 4.2  %  Final   ??? Basophils % 09/29/2018 0.4  % Final   ??? Absolute Neutrophils 09/29/2018 4.8  2.0 - 7.5 10*9/L Final   ??? Absolute Lymphocytes 09/29/2018 2.8  1.5 - 5.0 10*9/L Final   ??? Absolute Monocytes 09/29/2018 0.6  0.2 - 0.8 10*9/L Final   ??? Absolute Eosinophils 09/29/2018 0.4  0.0 - 0.4 10*9/L Final   ??? Absolute Basophils 09/29/2018 0.0  0.0 - 0.1 10*9/L Final   ??? Large Unstained Cells 09/29/2018 2  0 - 4 % Final   ??? Macrocytosis 09/29/2018 Slight* Not Present Final   ??? Anisocytosis 09/29/2018 Slight* Not Present Final       Lab Results   Component Value Date    PSA 15.50 (H) 08/12/2018    PSA 16.70 (H) 08/04/2018    PSA 14.50 (H) 07/15/2018    PSA 10.60 (H) 04/15/2018    PSA 7.80 (H) 01/07/2018    PSA 5.72 (H) 10/01/2017 Medical Oncology (Prostate Cancer)    Impression:   Castration-resistant metastatic prostate cancer to multiple lymph nodes (progression on Lupron), on abiraterone (added 5/18).  I previously asked Radiation Oncology to weigh in about potential RT to the lymph nodes, and they felt this would not be beneficial and would confer toxicity without likely long-term disease control.  He has been asymptomatic.  He has had RT to the prostate in the past.      Plan:  - Continue Lupron every three months (04/15/18).  - Continue abiraterone/prednisone.  - Fatigue on abiraterone - he does not want to try ritalin currently but might consider in the future.  - Follow PSA.  - Follow LFTs - today pending.  - Blood pressure control with his PCP.  - Nocturia: persistent, does not want Flomax.   - Anxiety: On Lexapro - Pharmacy ran interactions for both olaparib and abiraterone.  - Smoking: He understands risks, and the [place] smoking cessation program here has been meeting with him for counseling.  - Bone density: Checked again in 6/19 and although only mildly low, had 10% loss in bone density from 2016-2019 on Lupron and now prednisone.  Starting Fosamax, calcium/D.  Follow up for Lupron and monitoring on abiraterone.    -------------------------------------------------------    Referring physician:  Riki Altes, MD [facility])  Other physician:  Irven Easterly, MD    CC: Prostate cancer to lymph nodes and possibly bone s/p IMRT to prostate, with elevated PSA, on Lupron    Current therapy: Lupron    HPI:  2010: IMRT to prostate for T1c prostate adenocarcinoma, biopsy with up to Gleason 4+3=7.  Subsequent PSA rise with negative saturation biopsy on 11/17/12.  - 04/05/13: 8.3  - 08/29/13: 12.8  - 02/09/14: 13.3  10/2013: MRI with enlarged left obturator, iliac and para-aortic nodes up to 3 cm consistent with metastatic disease. Bilateral ischia and L pelvis with bony areas <100mm of unclear etiology (bone islands vs. metastases).  10/2013: Lupron started  Subsequent PSA:  - 05/01/14: 1.4  - 08/02/14: 1.4  - 11/03/14: 1.7  01/18/16: Bone scan: no metastases.  01/18/16: CT A/P: 2.6 cm left external iliac chain hyperenhancing node (series 6, image 57). Several prominent subcentimeter lymph nodes are seen along the left para-aortic retroperitoneum.  Prostate is enlarged measuring 6.0 x 6.7 x 9.3 cm. [Possible lipoma in gluteus maximus, or could be related to Lupron injection, discussed w urology, no action warranted.]  10/16/16: restaging:   - CT CAP: Interval increase in size of left external and  common iliac chain as well as periaortic lymph nodes, worrisome for metastatic disease.   - Bone Scan: No evidence of osseous metastatic disease.  5/18: Abiraterone/prednisone  6/19: BMD (c/w 2016): Lumbar spine: ??Normal bone density. ??The measurement has decreased significantly since prior study.  Left proximal femur: Mildly low bone density. ??The measurement has decreased significantly since prior study.    ROS: Nocturia: every 2-3 hours but drinks tea before bed, doesn't bother him.  No pain.  Fatigue.  Good appetite.  Has tolerated Lupron well.  Remainder of 10 system ROS negative.    PE  BP 158/72  - Pulse 62  - Temp 35.1 ??C (95.2 ??F) (Oral)  - Resp 18  - Ht 172.7 cm (5' 8)  - Wt 80.8 kg (178 lb 3.2 oz)  - SpO2 98%  - BMI 27.10 kg/m??   NAD  A+Ox3  No rash  No LEE  No cords  Neuro non focal  CTA  RRR  Abd NT ND    Current Outpatient Medications   Medication Sig Dispense Refill   ??? alendronate (FOSAMAX) 70 MG tablet Take 1 tablet (70 mg total) by mouth every seven (7) days. 12.85 tablet 3   ??? amLODIPine-benazepril (LOTREL 5-20) 5-20 mg per capsule Take 1 capsule by mouth daily.     ??? ascorbic acid, vitamin C, (VITAMIN C) 250 MG tablet Take 250 mg by mouth.     ??? aspirin (ECOTRIN) 81 MG tablet Take 81 mg by mouth.     ??? calcium carbonate (OS-CAL) 600 mg calcium (1,500 mg) tablet Take 1 tablet by mouth daily.     ??? escitalopram oxalate (LEXAPRO) 10 MG tablet Take 1 tablet (10 mg total) by mouth daily. 90 tablet 1   ??? leuprolide (LUPRON) 22.5 mg injection Inject 22.5 mg into the muscle Every three (3) months.     ??? multivitamin (THERAGRAN) per tablet Take 1 tablet by mouth daily.     ??? abiraterone (ZYTIGA) 250 mg Tab tablet TAKE 4 TABLETS BY MOUTH ONCE DAILY (Patient not taking: Reported on 09/30/2018) 120 each 6   ??? predniSONE (DELTASONE) 5 MG tablet TAKE 2 TABLETS BY MOUTH ONCE DAILY (Patient not taking: Reported on 09/30/2018) 60 tablet 6     No current facility-administered medications for this visit.      Allergies   Allergen Reactions   ??? Valium [Diazepam] Other (See Comments)     Hallucinations, altered mental status  Hallucinations, altered mental status           Orders Only on 09/29/2018   Component Date Value Ref Range Status   ??? EKG Ventricular Rate 09/29/2018 54  BPM Incomplete   ??? EKG Atrial Rate 09/29/2018 54  BPM Incomplete   ??? EKG P-R Interval 09/29/2018 234  ms Incomplete   ??? EKG QRS Duration 09/29/2018 90  ms Incomplete   ??? EKG Q-T Interval 09/29/2018 478  ms Incomplete   ??? EKG QTC Calculation 09/29/2018 453  ms Incomplete   ??? EKG Calculated P Axis 09/29/2018 74  degrees Incomplete   ??? EKG Calculated R Axis 09/29/2018 -57  degrees Incomplete   ??? EKG Calculated T Axis 09/29/2018 46  degrees Incomplete   ??? QTC Fredericia 09/29/2018 461  ms Incomplete   Lab on 09/29/2018   Component Date Value Ref Range Status   ??? LDH 09/29/2018 540  338 - 610 U/L Final   ??? Sodium 09/29/2018 138  135 - 145 mmol/L Final   ??? Potassium  09/29/2018 3.8  3.5 - 5.0 mmol/L Final   ??? Chloride 09/29/2018 103  98 - 107 mmol/L Final   ??? CO2 09/29/2018 27.0  22.0 - 30.0 mmol/L Final   ??? BUN 09/29/2018 12  7 - 21 mg/dL Final   ??? Creatinine 09/29/2018 0.79  0.70 - 1.30 mg/dL Final   ??? BUN/Creatinine Ratio 09/29/2018 15   Final   ??? EGFR CKD-EPI Non-African American,* 09/29/2018 85  >=60 mL/min/1.52m2 Final   ??? EGFR CKD-EPI African American, Male 09/29/2018 >90  >=60 mL/min/1.71m2 Final   ??? Glucose 09/29/2018 98  70 - 179 mg/dL Final   ??? Calcium 16/06/9603 9.3  8.5 - 10.2 mg/dL Final   ??? Albumin 54/05/8118 3.8  3.5 - 5.0 g/dL Final   ??? Total Protein 09/29/2018 6.9  6.5 - 8.3 g/dL Final   ??? Total Bilirubin 09/29/2018 0.4  0.0 - 1.2 mg/dL Final   ??? AST 14/78/2956 21  19 - 55 U/L Final   ??? ALT 09/29/2018 15  <50 U/L Final   ??? Alkaline Phosphatase 09/29/2018 67  38 - 126 U/L Final   ??? Anion Gap 09/29/2018 8  7 - 15 mmol/L Final   ??? Uric Acid 09/29/2018 3.5* 4.0 - 9.0 mg/dL Final   ??? Magnesium 21/30/8657 1.8  1.6 - 2.2 mg/dL Final   ??? Phosphorus 09/29/2018 3.8  2.9 - 4.7 mg/dL Final   ??? PT 84/69/6295 12.5  10.2 - 13.1 sec Final   ??? INR 09/29/2018 1.09   Final   ??? APTT 09/29/2018 33.9  25.9 - 39.5 sec Final   ??? Heparin Correlation 09/29/2018 0.2   Final    NOTE: This result is for use only with patients receiving heparin therapy in conjunction with the Heparin Nomogram.  In patients not receiving heparin, an elevated aPTT does not always imply anticoagulation.   ??? TSH 09/29/2018 3.749* 0.600 - 3.300 uIU/mL Final   ??? Free T4 09/29/2018 1.05  0.71 - 1.40 ng/dL Final   ??? Testosterone 09/29/2018 <5* 179 - 756 ng/dL Final   ??? Prolactin 28/41/3244 6.0  4.0 - 18.0 ng/mL Final   ??? LH 09/29/2018 <0.5  mIU/mL Final   ??? FSH 09/29/2018 3.7  mIU/mL Final   ??? Estradiol 09/29/2018 <20.0  pg/mL Final    Reference Ranges:  Serum Estrogen  (pg/ml)   Male: 5-66   Male (Postmenopausal): 5-38   Male (Ovulating):  (pg/ml)          follicular phase 27-161          periovulatory 187-382          luteal phase 33-201    ??? WBC 09/29/2018 8.7  4.5 - 11.0 10*9/L Final   ??? RBC 09/29/2018 4.15* 4.50 - 5.90 10*12/L Final   ??? HGB 09/29/2018 13.3* 13.5 - 17.5 g/dL Final   ??? HCT 09/24/7251 40.9* 41.0 - 53.0 % Final   ??? MCV 09/29/2018 98.7  80.0 - 100.0 fL Final   ??? MCH 09/29/2018 32.2  26.0 - 34.0 pg Final   ??? MCHC 09/29/2018 32.6  31.0 - 37.0 g/dL Final   ??? RDW 66/44/0347 16.5* 12.0 - 15.0 % Final   ??? MPV 09/29/2018 7.8  7.0 - 10.0 fL Final   ??? Platelet 09/29/2018 495* 150 - 440 10*9/L Final   ??? Neutrophils % 09/29/2018 55.3  % Final   ??? Lymphocytes % 09/29/2018 31.9  % Final   ??? Monocytes % 09/29/2018 6.4  % Final   ???  Eosinophils % 09/29/2018 4.2  % Final   ??? Basophils % 09/29/2018 0.4  % Final   ??? Absolute Neutrophils 09/29/2018 4.8  2.0 - 7.5 10*9/L Final   ??? Absolute Lymphocytes 09/29/2018 2.8  1.5 - 5.0 10*9/L Final   ??? Absolute Monocytes 09/29/2018 0.6  0.2 - 0.8 10*9/L Final   ??? Absolute Eosinophils 09/29/2018 0.4  0.0 - 0.4 10*9/L Final   ??? Absolute Basophils 09/29/2018 0.0  0.0 - 0.1 10*9/L Final   ??? Large Unstained Cells 09/29/2018 2  0 - 4 % Final   ??? Macrocytosis 09/29/2018 Slight* Not Present Final   ??? Anisocytosis 09/29/2018 Slight* Not Present Final       Lab Results   Component Value Date    PSA 15.50 (H) 08/12/2018    PSA 16.70 (H) 08/04/2018    PSA 14.50 (H) 07/15/2018    PSA 10.60 (H) 04/15/2018    PSA 7.80 (H) 01/07/2018    PSA 5.72 (H) 10/01/2017

## 2018-09-30 NOTE — Unmapped (Signed)
Nice seeing you today.   If you have any questions please call the Nurse Navigator in the office at [phone], or [phone]) [phone].  Frederic Jericho, MD

## 2018-09-30 NOTE — Unmapped (Signed)
Spoke with pt and let him know that PVLs were negative and that bone scan did not show any osseous metastatic disease. Pt thankful for call.

## 2018-10-01 LAB — DHEA SULFATE: Dehydroepiandrosterone sulfate:MCnc:Pt:Ser/Plas:Qn:: 10

## 2018-10-04 ENCOUNTER — Ambulatory Visit: Admit: 2018-10-04 | Discharge: 2018-10-05 | Payer: MEDICARE

## 2018-10-04 DIAGNOSIS — C61 Malignant neoplasm of prostate: Principal | ICD-10-CM

## 2018-10-05 LAB — DIHYDROTESTOSTERONE: Androstanolone:MCnc:Pt:Ser/Plas:Qn:: <50 — ABNORMAL LOW

## 2018-10-05 NOTE — Unmapped (Signed)
[facility] Referral: Financial Assistance Approved      Medication (Brand/Generic): [employer] completed with resulted information below:    Patient ABLE to fill at [facility]  Insurance Company:  [employer]  Anticipated Copay: $0  Is anticipated copay with a copay card or grant? Yes    Does patient's insurance plan only allow a 15 day supply for the first 6 fills in the Split Fill Program? No  If yes, inform patient they can request to dis-enroll from the [facility] by calling the patient help desk at N/A.      If the copay is under the $25 defined limit, per policy there will be no further investigation of need for financial assistance at this time unless patient requests. This referral has been communicated to the provider and handed off to the [facility] team for further processing and filling of prescribed medication.   ______________________________________________________________________  Please utilize this referral for viewing purposes as it will serve as the central location for all relevant documentation and updates.

## 2018-10-05 NOTE — Unmapped (Signed)
Clinical Study: (IRB#: [id number]) A Phase 1b/2 Study of CPI-1205, a Small Molecule Inhibitor of EZH2, Combined with Enzalutamide or Abiraterone/Prednisone in Patients with Metastatic Castration Resistant Prostate Cancer    Study Treatment: Single Arm Phase 2 (Enzalutamide +/- CPI-1205)     Date of Service: 08JAN2020     Cycle / Day: Consenting/Screening Visit     Performance Status: ECOG PS = 0    INFORMED CONSENT AND SCREENING NOTE    Narrative: Patient in clinic to discuss consent process for the CPI-1205-201 clinical trial. The protocol was reviewed in a private room by [employer] with patient and spouse present. The details of the study as well as the main and HIPAA consent forms were reviewed. The discussion included but was not limited to the following: nature and purpose of the study, potential risks/side effects and benefits, study treatments, schedule and procedures including post study follow up, expected time commitment, and cost of participation. The importance of appropriate contraception methods for patient and spouse were discussed and patient and spouse gave verbal agreement to comply. It was stressed to the patient that participating in the study is completely voluntary and they could decide to withdrawal consent at any time. Dr. Vernell Barrier previously spoke with the patient regarding any alternative therapy options. The patient was given ample time to review the documents and all questions were answered to their satisfaction. Patient was correctly able to recall details we discussed regarding the consent forms and the study when asked. In my signed presence as witness, patient signed all required consent documentation. Copies of signed documents were given to patient for their records. Study coordinator contact information was provided and the patient was instructed to call with any questions or concerns.      Con-Meds reviewed with patient and checked against prohibited meds list, none are prohibited. The patient has no other uncontrolled concomitant medical conditions compromising participation in the study. Remaining study labs not drawn as standard of care were drawn today after consent. EKG performed. Screening correlative kits drawn.      Life expectancy is greater than 12 weeks.     Concomitant Medication Log    Medication Dose/  Route Start Date Stop Date Indication Related to AE?  (yes/no, specify if yes)   Tamsulosin   0.4mg  PO QD 10JAN2020 Ongoing  Nocturia  Med Hx   Alendronate   70mg  PO Q Week 25JUL2019 Ongoing Health Maintenance Med Hx   Aspirin 81mg  PO  QD 30JAN2014 Ongoing Cardiac Prophylaxis Med Hx   Acetaminophen 500mg  PO Q6o PRN [id number] Ongoing Aches/Pains Med Hx   Amlodipine-Benazepril 5/20mg  PO QD 09MAR2018 Ongoing Hypertension Med Hx   Escitalopram Oxalate 10mg  PO BID 01FEB2019 Ongoing  Anxiety/Depression Med Hx   Vitamin C   250mg  PO  3 tabs Q8o 01MAR2018 Ongoing General Health Med Hx   Calcium Carbonate 600mg  PO QD 24NOV2019 Ongoing Diabetes Mellitus  Med Hx   Prednisone 10mg  PO QD [id number] 07/2018 Prostate CA Med Hx   Abiraterone 250mg  PO 4 tab QD [id number] 07/2018 Prostate CA Med Hx   Multivitamin 1 Tab PO   QD 04FEB2015 Ongoing Hypertension Med Hx   Leuprolide 45mg  IM 04FEB2015 Ongoing Prostate CA Med Hx      Medical History ??? UPDATED with more information  Diagnosis Start Date   Stop Date Grade   Clinically significant (yes/no)   Inguinal Hernia [id number] 2 No   Allergic Rhinitis 1995 1995 2 No   Bilateral Lower Extremity Edema 16DEC2019 Ongoing 1  No   Hypertension 03MAR2013 Ongoing 2 No   Nocutria [id number] Ongoing 1 No   Fatigue unAPR2013 Ongoing 1 No   Urinary Urgency [id number] Ongoing 1 No   Urinary Retention [id number] Ongoing 1 No      Surgical History & Procedures (Including prior CA treatments)    Procedure Start Date   Stop Date Indication     Inguinal Hernia Repair [id number] Inguinal Hernia               Patient denies:     ? Known symptomatic brain metastases.  ? Treatment with any of the following for prostate cancer within the indicated timeframe prior to day 1 of treatment:   First-generation AR antagonists (e.g., bicalutamide, nilutamide, flutamide) within 4 weeks, 5 alpha reductase inhibitors, ketoconazole, estrogens (including DES), or progesterones within 2 weeks, chemotherapy within 3 weeks, biologic therapy within 4 weeks, radionuclide therapy within 4 weeks.  ? Radiation therapy for the treatment of metastasis within 1 week prior to day 1 of treatment.  ? Herbal products that may decrease PSA levels within 4 weeks prior to day 1 of treatment or systemic steroids greater than 10 mg of prednisone/prednisolone or required treatment with one of the prohibited concomitant medications.  ? Major surgery within 4 weeks prior to day 1 of treatment.  ? Planned palliative procedures for alleviation of bone pain such as radiation therapy/surgery or Structurally unstable bone lesions concerning for impending fracture   ? Clinically significant cardiovascular disease including:   MI/stroke within 6 months prior to day 1 of treatment, unstable angina within 3 months, CHF with NYHA (see Appendix 3) class 3 or 4, history of clinically significant ventricular arrhythmias, uncontrolled hypertension (systolic BP > 170 mmHg or diastolic BP > 105 mmHg at screening) despite two concomitant antihypertensive therapies.   ? Active or symptomatic viral hepatitis or chronic liver disease, history of unresolved adrenal dysfunction, GI disorder that negatively affects absorption or achlorhydria   ? History of seizure, underlying brain injury with loss of consciousness, transient ischemic attack or brain arteriovenous malformation or known additional malignancy that is active and/or progressive requiring treatment.  ? Any other concurrent severe and/or uncontrolled concomitant medical condition that could compromise participation in the study (e.g., clinically significant pulmonary disease, clinically significant psychiatric or neurological disorder, active or uncontrolled infection)   ? Unwilliness or unable to comply with this study protocol.    Plan: Remaining outstanding screening procedures will be performed. Patient has screening procedures until 13JAN2020. Pending eligibility review and confirmation, the potential start date for C1D1 is 23JAN2019.

## 2018-10-07 ENCOUNTER — Ambulatory Visit: Payer: Medicare HMO | Admitting: Family Medicine

## 2018-10-07 ENCOUNTER — Encounter: Payer: Self-pay | Admitting: Family Medicine

## 2018-10-07 VITALS — BP 124/68 | HR 96 | Temp 98.1°F | Resp 16 | Ht 70.0 in | Wt 180.8 lb

## 2018-10-07 DIAGNOSIS — C61 Malignant neoplasm of prostate: Secondary | ICD-10-CM

## 2018-10-07 DIAGNOSIS — I959 Hypotension, unspecified: Secondary | ICD-10-CM

## 2018-10-07 DIAGNOSIS — I10 Essential (primary) hypertension: Secondary | ICD-10-CM | POA: Diagnosis not present

## 2018-10-07 MED ORDER — BENAZEPRIL HCL 20 MG PO TABS: 20.0000 mg | ORAL_TABLET | Freq: Every day | ORAL | 1 refills | Status: DC

## 2018-10-07 NOTE — Progress Notes (Signed)
Name: Kevin Donovan   MRN: [id number]    DOB: 09-Sep-1936   Date:10/07/2018       Progress Note  Subjective  Chief Complaint  Chief Complaint  Patient presents with  . Hypotension    BP low 80/50, 90/60  . Edema    feet and legs swelling started after being taken off medication    HPI  Pt presents with concern for hypotension and BLE edema.  He stopped Zytiga in November, and since then he has been having ever decreasing BP's and worsening BLE edema - most recently at home he had 80/50 and 90/60 readings.  Current medications for HTN include amlodipine-benazepril 5-20- did not have HTN prior to [facility].  Dr. Arlyss Gandy it the patient's radiation oncologist, and he did a work-up for DVT on 09/30/2018 - BLE [place] were negative for clot, 09/29/2018 CMP was normal, TSH was very slightly elevated, CBC showed some mild ongoing anemia but normal WBC, PT/INR was normal, states had EKG done 7 days ago and was told this was WNL as well.  Taking Lupron Q3mo for prostate cancer. Denies fatigue, chest pain, shortness of breath.   Patient Active Problem List   Diagnosis Date Noted  . Senile purpura ([place]) 01/28/2018  . Hot flashes 05/28/2017  . Fatigue 05/28/2017  . First degree AV block 11/28/2016  . Bradycardia on ECG 11/14/2016  . Situational depression 10/31/2016  . Calcification of abdominal aorta ([facility]) 10/31/2016  . Degenerative disc disease, lumbar 10/31/2016  . Prostate cancer metastatic to intrapelvic lymph node ([place]) 10/23/2016  . Malignant neoplasm of prostate ([place]) 08/27/2012  . Enlarged prostate with lower urinary tract symptoms (LUTS) 08/27/2012    Social History   Tobacco Use  . Smoking status: Current Every Day Smoker    Packs/day: 0.75    Years: 72.00    Pack years: 54.00    Types: Cigarettes    Start date: 01/29/1944  . Smokeless tobacco: Never Used  Substance Use Topics  . Alcohol use: No     Current Outpatient Medications:  .  alendronate (FOSAMAX) 70 MG tablet, Take by  mouth., Disp: , Rfl:  .  amLODipine-benazepril (LOTREL) 5-20 MG capsule, Take 1 capsule by mouth daily., Disp: 90 capsule, Rfl: 1 .  aspirin 81 MG tablet, Take 1 tablet by mouth daily., Disp: , Rfl:  .  escitalopram (LEXAPRO) 10 MG tablet, Take 1 tablet (10 mg total) by mouth daily., Disp: 90 tablet, Rfl: 1 .  leuprolide (LUPRON) 22.5 MG injection, Inject 22.5 mg every 3 (three) months into the muscle. , Disp: , Rfl:  .  Multiple Vitamin (MULTI-VITAMINS) TABS, Take by mouth., Disp: , Rfl:  .  vitamin C (ASCORBIC ACID) 250 MG tablet, Take 250 mg by mouth daily., Disp: , Rfl:  .  abiraterone Acetate (ZYTIGA) 250 MG tablet, Take 1,000 mg by mouth daily. , Disp: , Rfl:  .  clotrimazole-betamethasone (LOTRISONE) cream, Apply 1 application topically 2 (two) times daily. (Patient not taking: Reported on 10/07/2018), Disp: 45 g, Rfl: 0 .  ketoconazole (NIZORAL) 2 % cream, Apply 1 application topically 2 (two) times daily. (Patient not taking: Reported on 07/30/2018), Disp: 60 g, Rfl: 0 .  predniSONE (DELTASONE) 5 MG tablet, Take 10 mg daily with breakfast by mouth. , Disp: , Rfl:   Allergies  Allergen Reactions  . Diazepam Other (See Comments)    Hallucinations, altered mental status    I personally reviewed active problem list, medication list, allergies, notes from last encounter,  lab results with the patient/caregiver today.  ROS  Ten systems reviewed and is negative except as mentioned in HPI.  Objective  Vitals:   10/07/18 1246  BP: 124/68  Pulse: 96  Resp: 16  Temp: 98.1 F (36.7 C)  TempSrc: Oral  SpO2: 96%  Weight: 180 lb 12.8 oz (82 kg)  Height: 5\' 10"  (1.778 m)   Body mass index is 25.94 kg/m.  Nursing Note and Vital Signs reviewed.  Physical Exam  Constitutional: Patient appears well-developed and well-nourished. No distress.  HENT: Head: Normocephalic and atraumatic.  Eyes: Conjunctivae and EOM are normal. No scleral icterus.  Neck: Normal range of motion. Neck  supple. No JVD present. No thyromegaly present.  Cardiovascular: Normal rate, regular rhythm and normal heart sounds.  No murmur heard. +3 pitting BLE edema. Pulmonary/Chest: Effort normal and breath sounds normal. No respiratory distress. Musculoskeletal: Normal range of motion, no joint effusions. No gross deformities Neurological: Pt is alert and oriented to person, place, and time. No cranial nerve deficit. Coordination, balance, strength, speech and gait are normal.  Skin: Skin is warm and dry. No rash noted. No erythema.  Psychiatric: Patient has a normal mood and affect. behavior is normal. Judgment and thought content normal.  No results found for this or any previous visit (from the past 72 hour(s)).  Assessment & Plan  1. Hypotension, unspecified hypotension type - STOP Amlodipine  2. Essential hypertension - STOP Amlodipine - benazepril (LOTENSIN) 20 MG tablet; Take 1 tablet (20 mg total) by mouth daily.  Dispense: 30 tablet; Refill: 1 - Check BP 3-4 times weekly.  3. Malignant neoplasm of prostate ([place]) - Keep follow up with [facility]  -Red flags and when to present for emergency care or RTC including fever >101.25F, chest pain, shortness of breath, new/worsening/un-resolving symptoms, reviewed with patient at time of visit. Follow up and care instructions discussed and provided in AVS.

## 2018-10-07 NOTE — Patient Instructions (Signed)
STOP Amlodipine-Benazepril, START Benazepril once daily.  Check blood pressure 3-4 times a week and bring log with you to next visit.

## 2018-10-07 NOTE — Unmapped (Signed)
-----   Message from Lynett Grimes sent at 10/07/2018 10:10 AM EST -----  Regarding: Briscoe Burns  We are looking at a start date of 1/23 AFTER the patient sees me and Dr. Vernell Barrier  ----- Message -----  From: Elijah Birk, RN  Sent: 10/07/2018   9:55 AM EST  To: Daylene Posey, CPP, Enis Slipper said that enzalutamide can be mailed out now to patient. We wanted to know when the patient needs to start on this medication.    Thanks,  [employer]

## 2018-10-12 MED FILL — XTANDI 40 MG CAPSULE: 30 days supply | Qty: 120 | Fill #0 | Status: AC

## 2018-10-12 NOTE — Unmapped (Addendum)
Clinical Pharmacist Practitioner: [facility]    New Start Enzalutamide Education    John Watkins is a 83 y.o. male with mCRPC who I am counseling today on initiation of oral hormonal therapy.    Oral hormonal therapy regimen: Enzalutamide 160 mg daily  Tentative Start Date: 10/14/17    Side effects discussed included but were not limited to: fatigue, hypertension, headache, hot flashes, diarrhea, nausea, muscle and joint aches, edema, diabetes, lab abnormalities and seizures. Side effect prevention and management were also reviewed including laboratory monitoring.    Administration: I reviewed that this medication can be taken with or without food, as well as the importance of medication adherence.    Drug Interactions: Instructed the patient that this medication has potential drug interactions. The patient should inform me of any new prescriptions so that I may evaluate for potential drug interactions.other medications reviewed and up to date in Epic.  Patient takes escitalopram and tamsulosin which may have decreased efficacy in the setting of CYP3A4 induction by enzalutamide.    Storage requirements: this medicine should be stored at room temperature.     Handling precautions reviewed    Comorbidities/Allergies: reviewed and up to date in Epic.    Patient verbalized understanding of the above information as well as how to contact the Team with any questions/concerns.    Time with patient: 10 min    Laverna Peace PharmD, BCOP, CPP  Hematology/Oncology Pharmacist  P: [phone]    Next Scheduled Delivery Date: 10/13/18 from [facility]   Confirmed Shipping address: [address]  [facility] [place] [address]    Financial/Shipment   Primary Billing: Medicare  Secondary Billing Kennedy Bucker, Medicaid, CoPay Card, Secondary Commercial): PANF for $7300  Anticipated copay of $0 reviewed with patient (See full details under Referrals tab in EPIC).    Verified delivery address in FSI and reviewed medication storage requirement.    The following was explained to the patient:  Advised patient of the following:  -A Welcome packet will be sent to the patient   -Assignment of Benefit for the patient to review and return before next refill  -Arrangement of payment method can be done by contacting the pharmacy  -Take medications with during travel, have doctor's appointments, or if being admitted to the hospital.    Advised patient of refill order process:  Specialty pharmacy process for medication shipment was also reviewed.  Discussed the service provided by [facility] with respects to monthly outbound calls to the patient to set up refill deliveries 7-10 days prior to their subsequent needed refill.  Emphasized need for patient to be reachable in order to schedule medication shipment and that shipment will be shipped to the deliverable address provided via UPS.  Informed patient that welcome packet will be sent.        Provided the [facility] pharmacy contact information below:  [facility] [phone], option 4)    Patient Specific   The following items were reviewed and no issues were identified:  Complete medication list, PMH, allergies    Relevant cultural assessment and health literacy was completed as below:  Patient speaks  English, has no other identified physical or cognitive barriers and is able to store his/her medication as directed.  Patient prefers to have medications discussed with  Patient.  Patient is able to read and understand education materials at a high school level or above.

## 2018-10-13 NOTE — Unmapped (Signed)
Spoke with wife, Fran Lowes regarding the receipt of enzalutamide. She stated that they received the call today to expect delivery of medication tomorrow and to make sure they bring it to the visit. Patient verbalized understanding.

## 2018-10-14 ENCOUNTER — Ambulatory Visit: Admit: 2018-10-14 | Discharge: 2018-10-14 | Payer: MEDICARE | Attending: Medical Oncology | Primary: Medical Oncology

## 2018-10-14 ENCOUNTER — Other Ambulatory Visit: Admit: 2018-10-14 | Discharge: 2018-10-14 | Payer: MEDICARE

## 2018-10-14 ENCOUNTER — Ambulatory Visit: Admit: 2018-10-14 | Discharge: 2018-10-14 | Payer: MEDICARE

## 2018-10-14 DIAGNOSIS — R946 Abnormal results of thyroid function studies: Secondary | ICD-10-CM

## 2018-10-14 DIAGNOSIS — C775 Secondary and unspecified malignant neoplasm of intrapelvic lymph nodes: Secondary | ICD-10-CM

## 2018-10-14 DIAGNOSIS — C61 Malignant neoplasm of prostate: Principal | ICD-10-CM

## 2018-10-14 DIAGNOSIS — R791 Abnormal coagulation profile: Secondary | ICD-10-CM

## 2018-10-14 DIAGNOSIS — R799 Abnormal finding of blood chemistry, unspecified: Principal | ICD-10-CM

## 2018-10-14 DIAGNOSIS — Z7982 Long term (current) use of aspirin: Secondary | ICD-10-CM | POA: Diagnosis not present

## 2018-10-14 DIAGNOSIS — Z5111 Encounter for antineoplastic chemotherapy: Secondary | ICD-10-CM | POA: Diagnosis not present

## 2018-10-14 DIAGNOSIS — Z7983 Long term (current) use of bisphosphonates: Secondary | ICD-10-CM | POA: Diagnosis not present

## 2018-10-14 DIAGNOSIS — Z79818 Long term (current) use of other agents affecting estrogen receptors and estrogen levels: Secondary | ICD-10-CM | POA: Diagnosis not present

## 2018-10-14 LAB — COMPREHENSIVE METABOLIC PANEL
ALBUMIN: 3.5 g/dL (ref 3.5–5.0)
ALKALINE PHOSPHATASE: 76 U/L (ref 38–126)
ALT (SGPT): 18 U/L (ref <50)
ANION GAP: 7 mmol/L (ref 7–15)
AST (SGOT): 21 U/L (ref 19–55)
BLOOD UREA NITROGEN: 11 mg/dL (ref 7–21)
BUN / CREAT RATIO: 13
CALCIUM: 9.6 mg/dL (ref 8.5–10.2)
CHLORIDE: 105 mmol/L (ref 98–107)
CO2: 28 mmol/L (ref 22.0–30.0)
CREATININE: 0.85 mg/dL (ref 0.70–1.30)
EGFR CKD-EPI AA MALE: >90 mL/min/{1.73_m2} (ref >=60)
EGFR CKD-EPI NON-AA MALE: 82 mL/min/{1.73_m2} (ref >=60)
GLUCOSE RANDOM: 108 mg/dL (ref 70–179)
SODIUM: 140 mmol/L (ref 135–145)

## 2018-10-14 LAB — NEUTROPHILS RELATIVE PERCENT: Lab: 60.9

## 2018-10-14 LAB — LIPID PANEL
CHOLESTEROL/HDL RATIO SCREEN: 3 (ref <5.0)
CHOLESTEROL: 140 mg/dL (ref 100–199)
LDL CHOLESTEROL CALCULATED: 75 mg/dL (ref 60–99)
NON-HDL CHOLESTEROL: 93 mg/dL
VLDL CHOLESTEROL CAL: 18.2 mg/dL (ref 12–42)

## 2018-10-14 LAB — VLDL CHOLESTEROL CAL: Cholesterol.in VLDL:MCnc:Pt:Ser/Plas:Qn:Calculated: 18.2

## 2018-10-14 LAB — CBC W/ AUTO DIFF
EOSINOPHILS ABSOLUTE COUNT: 0.2 10*9/L (ref 0.0–0.4)
EOSINOPHILS RELATIVE PERCENT: 3.5 %
HEMATOCRIT: 38.7 % — ABNORMAL LOW (ref 41.0–53.0)
HEMOGLOBIN: 12.7 g/dL — ABNORMAL LOW (ref 13.5–17.5)
LARGE UNSTAINED CELLS: 2 % (ref 0–4)
LYMPHOCYTES ABSOLUTE COUNT: 1.7 10*9/L (ref 1.5–5.0)
LYMPHOCYTES RELATIVE PERCENT: 27.1 %
MEAN CORPUSCULAR HEMOGLOBIN CONC: 32.7 g/dL (ref 31.0–37.0)
MEAN CORPUSCULAR HEMOGLOBIN: 32.6 pg (ref 26.0–34.0)
MEAN CORPUSCULAR VOLUME: 99.5 fL (ref 80.0–100.0)
MEAN PLATELET VOLUME: 7.8 fL (ref 7.0–10.0)
MONOCYTES ABSOLUTE COUNT: 0.4 10*9/L (ref 0.2–0.8)
MONOCYTES RELATIVE PERCENT: 5.9 %
NEUTROPHILS ABSOLUTE COUNT: 3.9 10*9/L (ref 2.0–7.5)
NEUTROPHILS RELATIVE PERCENT: 60.9 %
PLATELET COUNT: 364 10*9/L (ref 150–440)
RED BLOOD CELL COUNT: 3.89 10*12/L — ABNORMAL LOW (ref 4.50–5.90)
RED CELL DISTRIBUTION WIDTH: 16.1 % — ABNORMAL HIGH (ref 12.0–15.0)
WBC ADJUSTED: 6.4 10*9/L (ref 4.5–11.0)

## 2018-10-14 LAB — URIC ACID: Urate:MCnc:Pt:Ser/Plas:Qn:: 4.2

## 2018-10-14 LAB — CREATINE KINASE TOTAL: Creatine kinase:CCnc:Pt:Ser/Plas:Qn:: 44 — ABNORMAL LOW

## 2018-10-14 LAB — PROSTATE SPECIFIC ANTIGEN: Prostate specific Ag:MCnc:Pt:Ser/Plas:Qn:: 20.1 — ABNORMAL HIGH

## 2018-10-14 LAB — LACTATE DEHYDROGENASE: Lactate dehydrogenase:CCnc:Pt:Ser/Plas:Qn:: 456

## 2018-10-14 LAB — PHOSPHORUS: Phosphate:MCnc:Pt:Ser/Plas:Qn:: 3.8

## 2018-10-14 LAB — EGFR CKD-EPI NON-AA MALE: Lab: 82

## 2018-10-14 LAB — MAGNESIUM: Magnesium:MCnc:Pt:Ser/Plas:Qn:: 2

## 2018-10-14 NOTE — Unmapped (Signed)
Addended by: Rocky Crafts on: 10/14/2018 11:12 AM     Modules accepted: Orders

## 2018-10-14 NOTE — Unmapped (Signed)
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 He has been asymptomatic.  He has had RT to the prostate in the past.  He is here today to start clinical trial CPI-1201-201 ([facility]), he was randomly assigned to the enzalutamide monotherapy arm.    Plan:  - Continue Lupron every three months (10/14/18).  - Begin CPI-1201-201 ([facility]) trial, enzalutamide monotherapy arm.  - I previously asked Radiation Oncology to weigh in about potential RT to the lymph nodes, and they felt this would not be beneficial and would confer toxicity without likely long-term disease control.      - Lower extremity swelling after trauma with a wood board during a project; coincident with discontinuing abiraterone/prednisone, so etiology unclear.  Negative dopplers previously.  - Will continue BP meds started w abiraterone but will discontinue if BP falls below 110 or symptomatic.  - Clinical genetics saw him, negative genomic screening.  - Fatigue on abiraterone - he does not want to try ritalin currently but might consider in the future.  - Follow PSA.  - Follow LFTs - today pending.  - Blood pressure control with his PCP and followed by [facility].  - Nocturia: persistent, does not want Flomax.   - Anxiety: On Lexapro - Pharmacy ran interactions for both olaparib and abiraterone.  - Smoking: He understands risks, and the [place] smoking cessation program here has been meeting with him for counseling.  - Bone density: Checked again in 6/19 and although only mildly low, had 10% loss in bone density from 2016-2019 on Lupron and now prednisone.  On Fosamax, calcium/D.    Follow up per trial.     -------------------------------------------------------    Referring physician:  Riki Altes, MD [facility])  Other physician:  Irven Easterly, MD    CC: Prostate cancer to lymph nodes and possibly bone s/p IMRT to prostate, with elevated PSA, on Lupron    Current therapy: Lupron    HPI:  2010: IMRT to prostate for T1c prostate adenocarcinoma, biopsy with up to Gleason 4+3=7.  Subsequent PSA rise with negative saturation biopsy on 11/17/12.  - 04/05/13: 8.3  - 08/29/13: 12.8  - 02/09/14: 13.3  10/2013: MRI with enlarged left obturator, iliac and para-aortic nodes up to 3 cm consistent with metastatic disease. Bilateral ischia and L pelvis with bony areas <23mm of unclear etiology (bone islands vs. metastases).  10/2013: Lupron started  Subsequent PSA:  - 05/01/14: 1.4  - 08/02/14: 1.4  - 11/03/14: 1.7  01/18/16: Bone scan: no metastases.  01/18/16: CT A/P: 2.6 cm left external iliac chain hyperenhancing node (series 6, image 57). Several prominent subcentimeter lymph nodes are seen along the left para-aortic retroperitoneum.  Prostate is enlarged measuring 6.0 x 6.7 x 9.3 cm. [Possible lipoma in gluteus maximus, or could be related to Lupron injection, discussed w urology, no action warranted.]  10/16/16: restaging:   - CT CAP: Interval increase in size of left external and common iliac chain as well as periaortic lymph nodes, worrisome for metastatic disease.   - Bone Scan: No evidence of osseous metastatic disease.  5/18: Abiraterone/prednisone  6/19: BMD (c/w 2016): Lumbar spine: ??Normal bone density. ??The measurement has decreased significantly since prior study.  Left proximal femur: Mildly low bone density. ??The measurement has decreased significantly since prior study.  04/15/18: PSA 10.6  08/04/18: PSA rising on abiraterone, now 16.7  08/04/18: Bone  scan: No evidence of osseous metastasis.  08/04/18: CT CAP: Interval increase in external iliac, periaortic, and aortocaval lymphadenopathy concerning for disease progression.  1/20: Start enzalutamide on trial.      ROS: Nocturia: every 2-3 hours but drinks tea before bed, doesn't bother him.  No pain.  Fatigue.  Good appetite.  Has tolerated Lupron well.  Remainder of 10 system ROS negative.    PE:  ECOG 0  BP 144/72  - Pulse 56  - Temp 37.1 ??C (98.7 ??F) (Oral)  - Resp 18  - Ht 172.7 cm (5' 8)  - Wt 83.2 kg (183 lb 6.4 oz)  - SpO2 98%  - BMI 27.89 kg/m??   NAD  A+Ox3  No rash  No LEE  No cords  Neuro non focal  CTA  RRR  Abd NT ND    Current Outpatient Medications   Medication Sig Dispense Refill   ??? alendronate (FOSAMAX) 70 MG tablet Take 1 tablet (70 mg total) by mouth every seven (7) days. 12.85 tablet 3   ??? ascorbic acid, vitamin C, (VITAMIN C) 250 MG tablet Take 250 mg by mouth.     ??? aspirin (ECOTRIN) 81 MG tablet Take 81 mg by mouth.     ??? benazepriL (LOTENSIN) 20 MG tablet Take 20 mg by mouth daily.     ??? calcium carbonate (OS-CAL) 600 mg calcium (1,500 mg) tablet Take 1 tablet by mouth daily.     ??? enzalutamide (XTANDI) 40 mg capsule Take 4 capsules (160 mg total) by mouth daily. 120 capsule 6   ??? escitalopram oxalate (LEXAPRO) 10 MG tablet Take 1 tablet (10 mg total) by mouth daily. 90 tablet 1   ??? leuprolide (LUPRON) 22.5 mg injection Inject 22.5 mg into the muscle Every three (3) months.     ??? multivitamin (THERAGRAN) per tablet Take 1 tablet by mouth daily.     ??? tamsulosin (FLOMAX) 0.4 mg capsule Take 1 capsule (0.4 mg total) by mouth daily. 90 capsule 3     No current facility-administered medications for this visit.      Allergies   Allergen Reactions   ??? Valium [Diazepam] Other (See Comments)     Hallucinations, altered mental status  Hallucinations, altered mental status           No visits with results within 2 Week(s) from this visit.   Latest known visit with results is:   Orders Only on 09/29/2018   Component Date Value Ref Range Status   ??? EKG Ventricular Rate 09/29/2018 54  BPM Final   ??? EKG Atrial Rate 09/29/2018 54  BPM Final   ??? EKG P-R Interval 09/29/2018 234  ms Final   ??? EKG QRS Duration 09/29/2018 90  ms Final   ??? EKG Q-T Interval 09/29/2018 478  ms Final   ??? EKG QTC Calculation 09/29/2018 453  ms Final   ??? EKG Calculated P Axis 09/29/2018 74  degrees Final   ??? EKG Calculated R Axis 09/29/2018 -57  degrees Final   ??? EKG Calculated T Axis 09/29/2018 46  degrees Final   ??? QTC Fredericia 09/29/2018 461  ms Final       Lab Results   Component Value Date    PSA 19.80 (H) 09/29/2018    PSA 15.50 (H) 08/12/2018    PSA 16.70 (H) 08/04/2018    PSA 14.50 (H) 07/15/2018    PSA 10.60 (H) 04/15/2018    PSA 7.80 (H) 01/07/2018

## 2018-10-14 NOTE — Unmapped (Signed)
Nice seeing you today.   If you have any questions please call the Nurse Navigator in the office at [phone], or [phone]) [phone].  Frederic Jericho, MD

## 2018-10-14 NOTE — Unmapped (Signed)
Labs drawn and sent for analysis.  Care provided by  Normand Sloop, CMA

## 2018-10-16 NOTE — Unmapped (Signed)
Clinical Study: CPI-1205-201: A Phase 1b/2 Study of CPI-1205, a Small Molecule Inhibitor of EZH2, Combined with Enzalutamide or Abiraterone/Prednisone in Patients with Metastatic Castration Resistant Prostate Cancer     Study Treatment: Randomized Arm Phase 2 (CPI-1205 + Enzalutamide)     Date of Service: 23JAN2020     Cycle / Day: C1D1     Performance Status: ECOG PS: 0     Narrative: Patient is in clinic to start C1D1 of CPI-1205 protocol therapy accompanied by his wife, [name]. Physical exam performed by Frederic Jericho, MD. Labs drawn prior to clinic visit and are WNL or deemed NCS by provider unless otherwise noted. He has no new complaints or symptoms to log as AEs. Patient randomized to control arm on 21JAN2020. QoL???s completed by patient and analgesic score completed by study coordinator Pre-dose ECG completed as per protocol. First am dose of enzalutamide in clinic at 11:45am observed by study coordinator. Patient and wife extensively educated on side effects, dosing schedule, medication diary and both verbalized understanding. Contact information for study coordinator and Dr. Vernell Barrier provided and patient agrees to contact office with any questions or concerns     Plan: Patient will return to clinic in 2 weeks for C1D15 on 06FEB2020.      Concomitant Medication Log: New medication highlighted in yellow.      Medication Dose/  Route Start Date Stop Date Indication Related to AE?  (yes/no, specify if yes)   Tamsulosin   0.4mg  PO QD 10JAN2020 Ongoing  Nocturia  Med Hx   Alendronate   70mg  PO Q Week 25JUL2019 Ongoing Health Maintenance Med Hx   Aspirin 81mg  PO  QD 30JAN2014 Ongoing Cardiac Prophylaxis Med Hx   Acetaminophen 500mg  PO Q6o PRN [id number] Ongoing Aches/Pains Med Hx   Amlodipine-Benazepril 5/20mg  PO QD 09MAR2018 16JAN2020 Hypertension Med Hx   Escitalopram Oxalate 10mg  PO BID 01FEB2019 Ongoing  Anxiety/Depression Med Hx   Vitamin C   250mg  PO  3 tabs Q8o 01MAR2018 Ongoing General Health Med Hx   Calcium Carbonate 600mg  PO QD 24NOV2019 Ongoing Diabetes Mellitus  Med Hx   Prednisone 10mg  PO QD [id number] 07/2018 Prostate CA Med Hx   Abiraterone 250mg  PO 4 tab QD [id number] 07/2018 Prostate CA Med Hx   Multivitamin 1 Tab PO   QD 04FEB2015 Ongoing General Health Med Hx   Leuprolide 45mg  IM 04FEB2015 Ongoing Prostate CA Med Hx   Benazepril HCL 20mg  PO QD 17JAN2020 Ongoing Hypertension Med Hx      Medical History  Diagnosis Start Date   Stop Date Grade   Clinically significant (yes/no)   Inguinal Hernia [id number] 2 No   Allergic Rhinitis 1995 1995 2 No   Bilateral Lower Extremity Edema 16DEC2019 Ongoing 1 No   Hypertension 03MAR2013 Ongoing 2 No   Nocutria [id number] Ongoing 1 No   Fatigue [id number] Ongoing 1 No   Urinary Urgency [id number] Ongoing 1 No   Urinary Retention [id number] Ongoing 1 No         Ezzard Flax, LPNII, CCRP  Study Coordinator  Pgr: [phone]

## 2018-10-22 ENCOUNTER — Ambulatory Visit: Payer: Medicare HMO | Admitting: Family Medicine

## 2018-10-22 ENCOUNTER — Encounter: Payer: Self-pay | Admitting: Family Medicine

## 2018-10-22 ENCOUNTER — Telehealth: Payer: Self-pay | Admitting: Family Medicine

## 2018-10-22 VITALS — BP 124/72 | HR 71 | Temp 98.0°F | Resp 16 | Ht 70.0 in | Wt 186.1 lb

## 2018-10-22 DIAGNOSIS — R600 Localized edema: Secondary | ICD-10-CM

## 2018-10-22 DIAGNOSIS — I10 Essential (primary) hypertension: Secondary | ICD-10-CM

## 2018-10-22 HISTORY — DX: Localized edema: R60.0

## 2018-10-22 MED ORDER — HYDROCHLOROTHIAZIDE 12.5 MG PO TABS: 12.5000 mg | ORAL_TABLET | Freq: Every day | ORAL | 1 refills | Status: DC

## 2018-10-22 MED ORDER — BENAZEPRIL HCL 20 MG PO TABS: 10.0000 mg | ORAL_TABLET | Freq: Every day | ORAL | 1 refills | Status: DC

## 2018-10-22 NOTE — Telephone Encounter (Signed)
Spoke to pt to inform him of getting his labs done next week

## 2018-10-22 NOTE — Assessment & Plan Note (Signed)
Decrease benazapril to 10mg  daily and add HCTZ 12.5mg  for BLE edema.  DASH diet discussed

## 2018-10-22 NOTE — Patient Instructions (Signed)
DASH Eating Plan  DASH stands for "Dietary Approaches to Stop Hypertension." The DASH eating plan is a healthy eating plan that has been shown to reduce high blood pressure (hypertension). It may also reduce your risk for type 2 diabetes, heart disease, and stroke. The DASH eating plan may also help with weight loss.  What are tips for following this plan?    General guidelines   Avoid eating more than 2,300 mg (milligrams) of salt (sodium) a day. If you have hypertension, you may need to reduce your sodium intake to 1,500 mg a day.   Limit alcohol intake to no more than 1 drink a day for nonpregnant women and 2 drinks a day for men. One drink equals 12 oz of beer, 5 oz of wine, or 1 oz of hard liquor.   Work with your health care provider to maintain a healthy body weight or to lose weight. Ask what an ideal weight is for you.   Get at least 30 minutes of exercise that causes your heart to beat faster (aerobic exercise) most days of the week. Activities may include walking, swimming, or biking.   Work with your health care provider or diet and nutrition specialist (dietitian) to adjust your eating plan to your individual calorie needs.  Reading food labels     Check food labels for the amount of sodium per serving. Choose foods with less than 5 percent of the Daily Value of sodium. Generally, foods with less than 300 mg of sodium per serving fit into this eating plan.   To find whole grains, look for the word "whole" as the first word in the ingredient list.  Shopping   Buy products labeled as "low-sodium" or "no salt added."   Buy fresh foods. Avoid canned foods and premade or frozen meals.  Cooking   Avoid adding salt when cooking. Use salt-free seasonings or herbs instead of table salt or sea salt. Check with your health care provider or pharmacist before using salt substitutes.   Do not fry foods. Cook foods using healthy methods such as baking, boiling, grilling, and broiling instead.   Cook with  heart-healthy oils, such as olive, canola, soybean, or sunflower oil.  Meal planning   Eat a balanced diet that includes:  ? 5 or more servings of fruits and vegetables each day. At each meal, try to fill half of your plate with fruits and vegetables.  ? Up to 6-8 servings of whole grains each day.  ? Less than 6 oz of lean meat, poultry, or fish each day. A 3-oz serving of meat is about the same size as a deck of cards. One egg equals 1 oz.  ? 2 servings of low-fat dairy each day.  ? A serving of nuts, seeds, or beans 5 times each week.  ? Heart-healthy fats. Healthy fats called Omega-3 fatty acids are found in foods such as flaxseeds and coldwater fish, like sardines, salmon, and mackerel.   Limit how much you eat of the following:  ? Canned or prepackaged foods.  ? Food that is high in trans fat, such as fried foods.  ? Food that is high in saturated fat, such as fatty meat.  ? Sweets, desserts, sugary drinks, and other foods with added sugar.  ? Full-fat dairy products.   Do not salt foods before eating.   Try to eat at least 2 vegetarian meals each week.   Eat more home-cooked food and less restaurant, buffet, and fast food.     When eating at a restaurant, ask that your food be prepared with less salt or no salt, if possible.  What foods are recommended?  The items listed may not be a complete list. Talk with your dietitian about what dietary choices are best for you.  Grains  Whole-grain or whole-wheat bread. Whole-grain or whole-wheat pasta. Brown rice. Oatmeal. Quinoa. Bulgur. Whole-grain and low-sodium cereals. Pita bread. Low-fat, low-sodium crackers. Whole-wheat flour tortillas.  Vegetables  Fresh or frozen vegetables (raw, steamed, roasted, or grilled). Low-sodium or reduced-sodium tomato and vegetable juice. Low-sodium or reduced-sodium tomato sauce and tomato paste. Low-sodium or reduced-sodium canned vegetables.  Fruits  All fresh, dried, or frozen fruit. Canned fruit in natural juice (without  added sugar).  Meat and other protein foods  Skinless chicken or turkey. Ground chicken or turkey. Pork with fat trimmed off. Fish and seafood. Egg whites. Dried beans, peas, or lentils. Unsalted nuts, nut butters, and seeds. Unsalted canned beans. Lean cuts of beef with fat trimmed off. Low-sodium, lean deli meat.  Dairy  Low-fat (1%) or fat-free (skim) milk. Fat-free, low-fat, or reduced-fat cheeses. Nonfat, low-sodium ricotta or cottage cheese. Low-fat or nonfat yogurt. Low-fat, low-sodium cheese.  Fats and oils  Soft margarine without trans fats. Vegetable oil. Low-fat, reduced-fat, or light mayonnaise and salad dressings (reduced-sodium). Canola, safflower, olive, soybean, and sunflower oils. Avocado.  Seasoning and other foods  Herbs. Spices. Seasoning mixes without salt. Unsalted popcorn and pretzels. Fat-free sweets.  What foods are not recommended?  The items listed may not be a complete list. Talk with your dietitian about what dietary choices are best for you.  Grains  Baked goods made with fat, such as croissants, muffins, or some breads. Dry pasta or rice meal packs.  Vegetables  Creamed or fried vegetables. Vegetables in a cheese sauce. Regular canned vegetables (not low-sodium or reduced-sodium). Regular canned tomato sauce and paste (not low-sodium or reduced-sodium). Regular tomato and vegetable juice (not low-sodium or reduced-sodium). Pickles. Olives.  Fruits  Canned fruit in a light or heavy syrup. Fried fruit. Fruit in cream or butter sauce.  Meat and other protein foods  Fatty cuts of meat. Ribs. Fried meat. Bacon. Sausage. Bologna and other processed lunch meats. Salami. Fatback. Hotdogs. Bratwurst. Salted nuts and seeds. Canned beans with added salt. Canned or smoked fish. Whole eggs or egg yolks. Chicken or turkey with skin.  Dairy  Whole or 2% milk, cream, and half-and-half. Whole or full-fat cream cheese. Whole-fat or sweetened yogurt. Full-fat cheese. Nondairy creamers. Whipped toppings.  Processed cheese and cheese spreads.  Fats and oils  Butter. Stick margarine. Lard. Shortening. Ghee. Bacon fat. Tropical oils, such as coconut, palm kernel, or palm oil.  Seasoning and other foods  Salted popcorn and pretzels. Onion salt, garlic salt, seasoned salt, table salt, and sea salt. Worcestershire sauce. Tartar sauce. Barbecue sauce. Teriyaki sauce. Soy sauce, including reduced-sodium. Steak sauce. Canned and packaged gravies. Fish sauce. Oyster sauce. Cocktail sauce. Horseradish that you find on the shelf. Ketchup. Mustard. Meat flavorings and tenderizers. Bouillon cubes. Hot sauce and Tabasco sauce. Premade or packaged marinades. Premade or packaged taco seasonings. Relishes. Regular salad dressings.  Where to find more information:   National Heart, Lung, and Blood Institute: www.nhlbi.nih.gov   American Heart Association: www.heart.org  Summary   The DASH eating plan is a healthy eating plan that has been shown to reduce high blood pressure (hypertension). It may also reduce your risk for type 2 diabetes, heart disease, and stroke.   With the   DASH eating plan, you should limit salt (sodium) intake to 2,300 mg a day. If you have hypertension, you may need to reduce your sodium intake to 1,500 mg a day.   When on the DASH eating plan, aim to eat more fresh fruits and vegetables, whole grains, lean proteins, low-fat dairy, and heart-healthy fats.   Work with your health care provider or diet and nutrition specialist (dietitian) to adjust your eating plan to your individual calorie needs.  This information is not intended to replace advice given to you by your health care provider. Make sure you discuss any questions you have with your health care provider.  Document Released: 08/28/2011 Document Revised: 09/01/2016 Document Reviewed: 09/01/2016  Elsevier Interactive Patient Education  2019 Elsevier Inc.

## 2018-10-22 NOTE — Progress Notes (Addendum)
Established Patient Office Visit  Subjective:  Patient ID: Kevin Donovan, male    DOB: [birth year]  Age: 83 y.o. MRN: [id number]  CC:  Chief Complaint  Patient presents with  . Follow-up    blood pressure    HPI Kevin Donovan presents for blood pressure follow up.  BP: He stopped Zytiga in November, and since then he has been having ever decreasing BP's and worsening BLE edema - most recently at home he had 80/50 and 90/60 readings. We took him off of amlodipine and left him on benazepril 20mg  2 weeks ago and has had significant improvement in BP's. Did not have HTN prior to [facility].  BP log today shows 118-140/60-90.  Denies lightheadedness or dizziness, no near syncope.   BLE Edema: Dr. Arlyss Gandy it the patient's radiation oncologist, and he did a work-up for DVT on 09/30/2018 - BLE [place] were negative for clot, 09/29/2018 CMP was normal, TSH was very slightly elevated, CBC showed some mild ongoing anemia but normal WBC, PT/INR was normal, states had EKG done 7 days ago and was told this was WNL as well.  Taking Lupron Q19mo for prostate cancer. Denies fatigue, chest pain, shortness of breath. He is wearing compression stockings with minimal improvement.  Due to stabilized BP's over the last 2 weeks, we will trial HCTZ 12.5mg  daily as he declines referral to Vascular today.   Past Medical History:  Diagnosis Date  . Prostate cancer [facility])     Past Surgical History:  Procedure Laterality Date  . APPENDECTOMY  2011  . COLONOSCOPY  2005    Family History  Problem Relation Age of Onset  . Lupus Mother   . Heart disease Mother        CHF  . Heart disease Father     Social History   Socioeconomic History  . Marital status: Married    Spouse name: [name]  . Number of children: 1  . Years of education: Not on file  . Highest education level: Some college, no degree  Occupational History  . Not on file  Social Needs  . Financial resource strain: Not hard at all  . Food  insecurity:    Worry: Never true    Inability: Never true  . Transportation needs:    Medical: No    Non-medical: No  Tobacco Use  . Smoking status: Current Every Day Smoker    Packs/day: 0.75    Years: 72.00    Pack years: 54.00    Types: Cigarettes    Start date: 01/29/1944  . Smokeless tobacco: Never Used  Substance and Sexual Activity  . Alcohol use: No  . Drug use: No  . Sexual activity: Yes    Partners: Female  Lifestyle  . Physical activity:    Days per week: 7 days    Minutes per session: 150+ min  . Stress: Not at all  Relationships  . Social connections:    Talks on phone: Three times a week    Gets together: Once a week    Attends religious service: Never    Active member of club or organization: No    Attends meetings of clubs or organizations: Never    Relationship status: Married  . Intimate partner violence:    Fear of current or ex partner: No    Emotionally abused: No    Physically abused: No    Forced sexual activity: No  Other Topics Concern  . Not on file  Social  History Narrative  . Not on file    Outpatient Medications Prior to Visit  Medication Sig Dispense Refill  . alendronate (FOSAMAX) 70 MG tablet Take by mouth.    Marland Kitchen aspirin 81 MG tablet Take 1 tablet by mouth daily.    Marland Kitchen escitalopram (LEXAPRO) 10 MG tablet Take 1 tablet (10 mg total) by mouth daily. 90 tablet 1  . leuprolide (LUPRON) 22.5 MG injection Inject 22.5 mg every 3 (three) months into the muscle.     . Multiple Vitamin (MULTI-VITAMINS) TABS Take by mouth.    . vitamin C (ASCORBIC ACID) 250 MG tablet Take 250 mg by mouth daily.    Gillermina Phy 40 MG capsule     . benazepril (LOTENSIN) 20 MG tablet Take 1 tablet (20 mg total) by mouth daily. 30 tablet 1  . abiraterone Acetate (ZYTIGA) 250 MG tablet Take 1,000 mg by mouth daily.     . predniSONE (DELTASONE) 5 MG tablet Take 10 mg daily with breakfast by mouth.      No facility-administered medications prior to visit.     Allergies   Allergen Reactions  . Diazepam Other (See Comments)    Hallucinations, altered mental status    ROS Review of Systems  Constitutional: Negative.   HENT: Negative.   Respiratory: Negative for cough, chest tightness and shortness of breath.   Cardiovascular: Positive for leg swelling. Negative for chest pain and palpitations.  Genitourinary: Negative.   Musculoskeletal: Negative.   Neurological: Negative.   Psychiatric/Behavioral: Negative.   All other systems reviewed and are negative.    Objective:    Physical Exam  Constitutional: He is oriented to person, place, and time. He appears well-developed and well-nourished. No distress.  HENT:  Head: Normocephalic and atraumatic.  Right Ear: External ear normal.  Left Ear: External ear normal.  Nose: Nose normal.  Mouth/Throat: Oropharynx is clear and moist. No oropharyngeal exudate.  Eyes: Pupils are equal, round, and reactive to light. Conjunctivae and EOM are normal.  Neck: Normal range of motion. Neck supple. No JVD present. No thyromegaly present.  Cardiovascular: Normal rate and regular rhythm. Exam reveals no gallop and no friction rub.  No murmur heard. Pulmonary/Chest: Breath sounds normal. He has no wheezes. He has no rales. He exhibits no tenderness.  Musculoskeletal: Normal range of motion.        General: Edema (Bilateral Lower Leg, +2 pitting) present. No tenderness.  Lymphadenopathy:    He has no cervical adenopathy.  Neurological: He is alert and oriented to person, place, and time. No cranial nerve deficit.  Skin: Skin is warm and dry. No rash noted.  Psychiatric: He has a normal mood and affect. His behavior is normal. Judgment and thought content normal.  Nursing note and vitals reviewed.   BP 124/72 (BP Location: Right Arm, Patient Position: Sitting, Cuff Size: Large)   Pulse 71   Temp 98 F (36.7 C) (Oral)   Resp 16   Ht 5\' 10"  (1.778 m)   Wt 186 lb 1.6 oz (84.4 kg)   SpO2 99%   BMI 26.70 kg/m    Wt Readings from Last 3 Encounters:  10/22/18 186 lb 1.6 oz (84.4 kg)  10/07/18 180 lb 12.8 oz (82 kg)  07/30/18 170 lb 11.2 oz (77.4 kg)     There are no preventive care reminders to display for this patient.  There are no preventive care reminders to display for this patient.  No results found for: TSH Lab Results  Component  Value Date   WBC 13.1 (H) 07/07/2017   HGB 12.8 (L) 07/07/2017   HCT 36.9 (L) 07/07/2017   MCV 94.1 07/07/2017   PLT 361 07/07/2017   Lab Results  Component Value Date   NA 140 07/07/2017   K 3.3 (L) 07/07/2017   CO2 27 07/07/2017   GLUCOSE 96 07/07/2017   BUN 12 07/07/2017   CREATININE 0.82 07/07/2017   BILITOT 0.4 07/07/2017   ALKPHOS 63 12/21/2013   AST 11 07/07/2017   ALT 12 07/07/2017   PROT 6.5 07/07/2017   ALBUMIN 3.7 12/21/2013   CALCIUM 9.0 07/07/2017   Lab Results  Component Value Date   CHOL 136 07/07/2017   Lab Results  Component Value Date   HDL 44 07/07/2017   Lab Results  Component Value Date   LDLCALC 74 07/07/2017   Lab Results  Component Value Date   TRIG 95 07/07/2017   Lab Results  Component Value Date   CHOLHDL 3.1 07/07/2017   No results found for: HGBA1C    Assessment & Plan:   Problem List Items Addressed This Visit      Cardiovascular and Mediastinum   Essential hypertension - Primary    Decrease benazapril to 10mg  daily and add HCTZ 12.5mg  for BLE edema.  DASH diet discussed      Relevant Medications   hydrochlorothiazide (HYDRODIURIL) 12.5 MG tablet   benazepril (LOTENSIN) 20 MG tablet   Other Relevant Orders   Basic Metabolic Panel (BMET)     Other   Bilateral edema of lower extremity    Had work up by radiation oncologist for DVT 09/30/2018 which was negative.  Wearing compression stockings. Recommend he elevate at night.  Will add 12.5mg  HCTZ to help with fluid retention. Follow up labs in 2 weeks.      Relevant Medications   hydrochlorothiazide (HYDRODIURIL) 12.5 MG tablet       Meds ordered this encounter  Medications  . hydrochlorothiazide (HYDRODIURIL) 12.5 MG tablet    Sig: Take 1 tablet (12.5 mg total) by mouth daily.    Dispense:  30 tablet    Refill:  1    Order Specific Question:   Supervising Provider    Answer:   Steele Sizer [[id number]]  . benazepril (LOTENSIN) 20 MG tablet    Sig: Take 0.5 tablets (10 mg total) by mouth daily.    Dispense:  30 tablet    Refill:  1    Order Specific Question:   Supervising Provider    Answer:   Steele Sizer [[id number]]    Follow-up: Return in about 2 weeks (around 11/05/2018) for CMA Visit BP check & labs.    Hubbard Hartshorn, FNP

## 2018-10-22 NOTE — Telephone Encounter (Signed)
Copied from [place] [id number]. Topic: General - Other >> Oct 22, 2018  3:43 PM Keene Breath wrote: Reason for CRM: Patient is returning a call to [facility] or the doctor.  Patient stated that the message just wanted her to call back.  Please advise and call patient back at [phone]

## 2018-10-22 NOTE — Assessment & Plan Note (Signed)
Had work up by radiation oncologist for DVT 09/30/2018 which was negative.  Wearing compression stockings. Recommend he elevate at night.  Will add 12.5mg  HCTZ to help with fluid retention. Follow up labs in 2 weeks.

## 2018-10-28 ENCOUNTER — Other Ambulatory Visit: Admit: 2018-10-28 | Discharge: 2018-10-29 | Payer: MEDICARE

## 2018-10-28 ENCOUNTER — Ambulatory Visit: Admit: 2018-10-28 | Discharge: 2018-10-29 | Payer: MEDICARE

## 2018-10-28 DIAGNOSIS — R946 Abnormal results of thyroid function studies: Secondary | ICD-10-CM

## 2018-10-28 DIAGNOSIS — R799 Abnormal finding of blood chemistry, unspecified: Principal | ICD-10-CM

## 2018-10-28 DIAGNOSIS — C775 Secondary and unspecified malignant neoplasm of intrapelvic lymph nodes: Secondary | ICD-10-CM

## 2018-10-28 DIAGNOSIS — C61 Malignant neoplasm of prostate: Secondary | ICD-10-CM

## 2018-10-28 DIAGNOSIS — R791 Abnormal coagulation profile: Secondary | ICD-10-CM

## 2018-10-28 LAB — CBC W/ AUTO DIFF
BASOPHILS ABSOLUTE COUNT: 0.1 10*9/L (ref 0.0–0.1)
BASOPHILS RELATIVE PERCENT: 0.8 %
EOSINOPHILS ABSOLUTE COUNT: 0.3 10*9/L (ref 0.0–0.4)
EOSINOPHILS RELATIVE PERCENT: 4.7 %
HEMATOCRIT: 41 % (ref 41.0–53.0)
HEMOGLOBIN: 13.1 g/dL — ABNORMAL LOW (ref 13.5–17.5)
LYMPHOCYTES ABSOLUTE COUNT: 2.4 10*9/L (ref 1.5–5.0)
LYMPHOCYTES RELATIVE PERCENT: 36.4 %
MEAN CORPUSCULAR HEMOGLOBIN CONC: 32 g/dL (ref 31.0–37.0)
MEAN CORPUSCULAR HEMOGLOBIN: 31.8 pg (ref 26.0–34.0)
MEAN CORPUSCULAR VOLUME: 99.3 fL (ref 80.0–100.0)
MEAN PLATELET VOLUME: 7.5 fL (ref 7.0–10.0)
MONOCYTES ABSOLUTE COUNT: 0.5 10*9/L (ref 0.2–0.8)
NEUTROPHILS ABSOLUTE COUNT: 3.2 10*9/L (ref 2.0–7.5)
NEUTROPHILS RELATIVE PERCENT: 48.7 %
PLATELET COUNT: 359 10*9/L (ref 150–440)
RED BLOOD CELL COUNT: 4.13 10*12/L — ABNORMAL LOW (ref 4.50–5.90)
RED CELL DISTRIBUTION WIDTH: 16.4 % — ABNORMAL HIGH (ref 12.0–15.0)
WBC ADJUSTED: 6.6 10*9/L (ref 4.5–11.0)

## 2018-10-28 LAB — CREATININE: EGFR CKD-EPI NON-AA MALE: 77 mL/min/{1.73_m2} (ref >=60)

## 2018-10-28 LAB — ALKALINE PHOSPHATASE: Alkaline phosphatase:CCnc:Pt:Ser/Plas:Qn:: 62

## 2018-10-28 LAB — URIC ACID: Urate:MCnc:Pt:Ser/Plas:Qn:: 4.6

## 2018-10-28 LAB — ALT (SGPT): Alanine aminotransferase:CCnc:Pt:Ser/Plas:Qn:: 13

## 2018-10-28 LAB — CHLORIDE: Chloride:SCnc:Pt:Ser/Plas:Qn:: 104

## 2018-10-28 LAB — AST (SGOT): Aspartate aminotransferase:CCnc:Pt:Ser/Plas:Qn:: 21

## 2018-10-28 LAB — MAGNESIUM: Magnesium:MCnc:Pt:Ser/Plas:Qn:: 1.7

## 2018-10-28 LAB — CO2: Carbon dioxide:SCnc:Pt:Ser/Plas:Qn:: 24

## 2018-10-28 LAB — GLUCOSE RANDOM: Glucose:MCnc:Pt:Ser/Plas:Qn:: 118

## 2018-10-28 LAB — EGFR CKD-EPI AA MALE: Lab: 89

## 2018-10-28 LAB — ALBUMIN: Albumin:MCnc:Pt:Ser/Plas:Qn:: 3.6

## 2018-10-28 LAB — PHOSPHORUS: Phosphate:MCnc:Pt:Ser/Plas:Qn:: 4.7

## 2018-10-28 LAB — SODIUM: Sodium:SCnc:Pt:Ser/Plas:Qn:: 141

## 2018-10-28 LAB — BILIRUBIN TOTAL: Bilirubin:MCnc:Pt:Ser/Plas:Qn:: 0.4

## 2018-10-28 LAB — POTASSIUM: Potassium:SCnc:Pt:Ser/Plas:Qn:: 4.2

## 2018-10-28 LAB — BASOPHILS RELATIVE PERCENT: Lab: 0.8

## 2018-10-28 LAB — CALCIUM: Calcium:MCnc:Pt:Ser/Plas:Qn:: 10

## 2018-10-28 LAB — BLOOD UREA NITROGEN: Urea nitrogen:MCnc:Pt:Ser/Plas:Qn:: 15

## 2018-10-28 LAB — LACTATE DEHYDROGENASE: Lactate dehydrogenase:CCnc:Pt:Ser/Plas:Qn:: 436

## 2018-10-28 NOTE — Unmapped (Signed)
Labs drawn and sent for analysis.  Care provided by  Isaac Laud, RN

## 2018-10-29 NOTE — Unmapped (Signed)
Clinical Study: CPI 1205-201: A Phase 1b/2 Study of CPI-1205, a Small Molecule Inhibitor of EZH2, Combined with Enzalutamide or Abiraterone/Prednisone in Patients with Metastatic Castration Resistant Prostate Cancer     Cycle / Day: C1D15      Oral Study Medication Compliance: 100%    Narrative: Patient meet with study coordinator to review medication compliance, any changes in health or medications. Patient also had local labs drawn. No PK sample was collected as the subject is on control arm. These will be reviewed by Dr. Vernell Barrier and is if any abnormal results will be addressed. Patient stated he had not complaints or change in medication. Review of his drug and diary showed 100% compliance with his medications    Plan: Patient to return to the clinic for C2D1 on 20FEB2020.     Ezzard Flax, LPNII, CCRP  Study Coordinator  Pgr: [phone]

## 2018-11-05 ENCOUNTER — Ambulatory Visit: Payer: Medicare HMO

## 2018-11-05 VITALS — BP 118/68 | HR 55

## 2018-11-05 DIAGNOSIS — I10 Essential (primary) hypertension: Secondary | ICD-10-CM

## 2018-11-05 NOTE — Progress Notes (Signed)
Patient is here for a blood pressure check. Patient denies chest pain, palpitations, shortness of breath or visual disturbances. At previous visit blood pressure was 124/72 with a heart rate of 71. Today during nurse visit first check blood pressure was 118/68.  He does take any blood pressure medications.  Last note: 10/22/2018: Decrease benazapril to 10mg  daily and add HCTZ 12.5mg  for BLE edema.  DASH diet discussed

## 2018-11-08 NOTE — Unmapped (Signed)
[facility] Refill Coordination Note    Specialty Medication(s) to be Shipped:   Hematology/Oncology: Xtandi 40 MG       [facility], DOB: [birth year]  Phone: [phone] (home)       All above HIPAA information was verified with patient.     Completed refill call assessment today to schedule patient's medication shipment from the [facility] [phone]).       Specialty medication(s) and dose(s) confirmed: Regimen is correct and unchanged.   Changes to medications: [name] reports no changes reported at this time.  Changes to insurance: No  Questions for the pharmacist: No    The patient will receive a drug information handout for each medication shipped and additional FDA Medication Guides as required.      DISEASE/MEDICATION-SPECIFIC INFORMATION        N/A    ADHERENCE     Medication Adherence    Patient reported X missed doses in the last month:  0  Specialty Medication:  Xtandi 40 MG  Patient is on additional specialty medications:  No  Patient is on more than two specialty medications:  No  Any gaps in refill history greater than 2 weeks in the last 3 months:  no  Demonstrates understanding of importance of adherence:  yes  Informant:  patient  Reliability of informant:  reliable  Adherence tools used:  medication list   Other adherence tool:  routine   Support network for adherence:  family member  Confirmed plan for next specialty medication refill:  delivery by [facility]          Refill Coordination    Has the Patients' Contact Information Changed:  No  Is the Shipping Address Different:  No         MEDICARE PART B DOCUMENTATION     Xtandi 40 MG: Patient has 4 days worth on hand.    SHIPPING     Shipping address confirmed in Epic.     Delivery Scheduled: Yes, Expected medication delivery date: 01/09/2019 via UPS or courier.     Medication will be delivered via Next Day Courier to the home address in [employer] [place].    [name]   [facility]

## 2018-11-09 MED FILL — XTANDI 40 MG CAPSULE: 30 days supply | Qty: 120 | Fill #1 | Status: AC

## 2018-11-09 MED FILL — XTANDI 40 MG CAPSULE: ORAL | 30 days supply | Qty: 120 | Fill #1

## 2018-11-09 NOTE — Unmapped (Signed)
[name]  [address]  [place] [address]      Dear [name],    It was a pleasure meeting you in the [facility] on 08/12/2018. We are sending this letter as a summary of our discussion and your genetic test results.    GENETIC TESTING: Most cancer is sporadic, meaning it happens by chance. Current data suggests perhaps up to ~5-10% of metastatic prostate cancers are hereditary. Identifying hereditary prostate cancer can be important for treatment planning and for cascade testing in relatives. We therefore recommend [name] pursue testing of 11 genes associated with hereditary prostate cancer, including BRCA1 and BRCA2.     We reviewed the characteristics, features, and inheritance patterns of hereditary cancer syndromes. We also discussed genetic testing, including the process of testing, insurance coverage, and implications of a positive, negative, and/or variant of uncertain significance (VUS) result.      Most hereditary prostate cancer is thought to be associated with pathogenic variants in BRCA1/2. Men who carry a pathogenic variant in BRCA1/2 are at somewhat increased lifetime risk for prostate cancer and male breast cancer, while women who carry a pathogenic variant in BRCA1/2 are at significantly increased lifetime risk of breast and ovarian cancers. Men who are BRCA1/2 positive and have metastatic prostate cancer may be candidates for certain chemotherapy or treatment changes. [name]'s test results could therefore have important implications for him and for his relatives.     Several other genes have also been suggested to increase lifetime prostate cancer risks, including highly penetrant genes such as the Lynch syndrome genes. There is some evidence to suggest a possible association between prostate cancer and low or moderate penetrance genes such as ATM and CHEK2; however, data is still being gathered, and appropriate management implications of some pathogenic variants in these genes remain unclear.     [name]'s testing at [facility], was NEGATIVE, meaning no cancer causing mutations were identified in the 11 genes evaluation. A negative result is reassuring for Mr John Watkins. However, immediate male relatives, including his children, would remain at somewhat increased risk to develop prostate cancer based on the family history alone. We would recommend that his sons discuss the potential benefits and limitations of prostate cancer screening with their PCP.    Again, it has been a pleasure working with you. Cancer genetics is a rapidly advancing field, and it is possible that new genetic tests could help guide your care in the future. We encourage you to remain in contact with [place] on an annual basis so we can update your personal and family histories and let you know of advances in cancer genetics that may benefit you and your family. If we can be of further assistance, please call us at [phone].    Sincerely,    Allean Found, MD, Genetics Fellow     Audree Camel, MD, Medical Geneticist      Enclosures: Genetic test results, family tree

## 2018-11-11 ENCOUNTER — Ambulatory Visit: Admit: 2018-11-11 | Discharge: 2018-11-12 | Payer: MEDICARE | Attending: Medical Oncology | Primary: Medical Oncology

## 2018-11-11 ENCOUNTER — Other Ambulatory Visit: Admit: 2018-11-11 | Discharge: 2018-11-12 | Payer: MEDICARE

## 2018-11-11 DIAGNOSIS — C61 Malignant neoplasm of prostate: Principal | ICD-10-CM

## 2018-11-11 DIAGNOSIS — R946 Abnormal results of thyroid function studies: Secondary | ICD-10-CM

## 2018-11-11 DIAGNOSIS — C775 Secondary and unspecified malignant neoplasm of intrapelvic lymph nodes: Secondary | ICD-10-CM

## 2018-11-11 DIAGNOSIS — R799 Abnormal finding of blood chemistry, unspecified: Principal | ICD-10-CM

## 2018-11-11 DIAGNOSIS — R791 Abnormal coagulation profile: Secondary | ICD-10-CM

## 2018-11-11 LAB — CBC W/ AUTO DIFF
BASOPHILS ABSOLUTE COUNT: 0 10*9/L (ref 0.0–0.1)
EOSINOPHILS ABSOLUTE COUNT: 0.3 10*9/L (ref 0.0–0.4)
EOSINOPHILS RELATIVE PERCENT: 4.2 %
HEMATOCRIT: 40.8 % — ABNORMAL LOW (ref 41.0–53.0)
HEMOGLOBIN: 13.5 g/dL (ref 13.5–17.5)
LARGE UNSTAINED CELLS: 1 % (ref 0–4)
LYMPHOCYTES ABSOLUTE COUNT: 1.9 10*9/L (ref 1.5–5.0)
LYMPHOCYTES RELATIVE PERCENT: 24.4 %
MEAN CORPUSCULAR HEMOGLOBIN CONC: 33.1 g/dL (ref 31.0–37.0)
MEAN CORPUSCULAR HEMOGLOBIN: 32.7 pg (ref 26.0–34.0)
MEAN CORPUSCULAR VOLUME: 98.8 fL (ref 80.0–100.0)
MEAN PLATELET VOLUME: 8.1 fL (ref 7.0–10.0)
MONOCYTES ABSOLUTE COUNT: 0.4 10*9/L (ref 0.2–0.8)
MONOCYTES RELATIVE PERCENT: 5.3 %
NEUTROPHILS ABSOLUTE COUNT: 5.1 10*9/L (ref 2.0–7.5)
NEUTROPHILS RELATIVE PERCENT: 64.3 %
RED BLOOD CELL COUNT: 4.13 10*12/L — ABNORMAL LOW (ref 4.50–5.90)
RED CELL DISTRIBUTION WIDTH: 16.1 % — ABNORMAL HIGH (ref 12.0–15.0)
WBC ADJUSTED: 7.9 10*9/L (ref 4.5–11.0)

## 2018-11-11 LAB — CALCIUM
CALCIUM: 9.6 mg/dL (ref 8.5–10.2)
Calcium:MCnc:Pt:Ser/Plas:Qn:: 9.6

## 2018-11-11 LAB — LIPID PANEL
CHOLESTEROL/HDL RATIO SCREEN: 3.1 (ref <5.0)
CHOLESTEROL: 143 mg/dL (ref 100–199)
HDL CHOLESTEROL: 46 mg/dL (ref 40–59)
LDL CHOLESTEROL CALCULATED: 77 mg/dL (ref 60–99)
TRIGLYCERIDES: 98 mg/dL (ref 1–149)
VLDL CHOLESTEROL CAL: 19.6 mg/dL (ref 12–42)

## 2018-11-11 LAB — PHOSPHORUS: Phosphate:MCnc:Pt:Ser/Plas:Qn:: 4.2

## 2018-11-11 LAB — ALBUMIN
ALBUMIN: 3.6 g/dL (ref 3.5–5.0)
Albumin:MCnc:Pt:Ser/Plas:Qn:: 3.6

## 2018-11-11 LAB — SODIUM: Sodium:SCnc:Pt:Ser/Plas:Qn:: 140

## 2018-11-11 LAB — CREATININE
Creatinine:MCnc:Pt:Ser/Plas:Qn:: 1
EGFR CKD-EPI AA MALE: 81 mL/min/{1.73_m2} (ref >=60)
EGFR CKD-EPI NON-AA MALE: 70 mL/min/{1.73_m2} (ref >=60)

## 2018-11-11 LAB — LYMPHOCYTES ABSOLUTE COUNT: Lab: 1.9

## 2018-11-11 LAB — ESTRADIOL LEVEL: Estradiol:MCnc:Pt:Ser/Plas:Qn:: 21.2

## 2018-11-11 LAB — PROTIME: Lab: 11.6

## 2018-11-11 LAB — GLUCOSE RANDOM: Glucose:MCnc:Pt:Ser/Plas:Qn:: 112

## 2018-11-11 LAB — BLOOD UREA NITROGEN: Urea nitrogen:MCnc:Pt:Ser/Plas:Qn:: 15

## 2018-11-11 LAB — CREATINE KINASE TOTAL: Creatine kinase:CCnc:Pt:Ser/Plas:Qn:: 57

## 2018-11-11 LAB — CO2: Carbon dioxide:SCnc:Pt:Ser/Plas:Qn:: 26

## 2018-11-11 LAB — HEPARIN CORRELATION: Lab: 0.2

## 2018-11-11 LAB — ALKALINE PHOSPHATASE: Alkaline phosphatase:CCnc:Pt:Ser/Plas:Qn:: 64

## 2018-11-11 LAB — PROSTATE SPECIFIC ANTIGEN: Prostate specific Ag:MCnc:Pt:Ser/Plas:Qn:: 6.63 — ABNORMAL HIGH

## 2018-11-11 LAB — LACTATE DEHYDROGENASE: Lactate dehydrogenase:CCnc:Pt:Ser/Plas:Qn:: 458

## 2018-11-11 LAB — TRIGLYCERIDES: Triglyceride:MCnc:Pt:Ser/Plas:Qn:: 98

## 2018-11-11 LAB — ALT (SGPT): Alanine aminotransferase:CCnc:Pt:Ser/Plas:Qn:: 15

## 2018-11-11 LAB — LUTEINIZING HORMONE: Lutropin:ACnc:Pt:Ser/Plas:Qn:: <0.5

## 2018-11-11 LAB — FOLLICLE STIMULATING HORMONE: Follitropin:ACnc:Pt:Ser/Plas:Qn:: 4.4

## 2018-11-11 LAB — POTASSIUM: Potassium:SCnc:Pt:Ser/Plas:Qn:: 4.3

## 2018-11-11 LAB — BILIRUBIN TOTAL: Bilirubin:MCnc:Pt:Ser/Plas:Qn:: 0.3

## 2018-11-11 LAB — URIC ACID: Urate:MCnc:Pt:Ser/Plas:Qn:: 4.3

## 2018-11-11 LAB — PROTIME-INR: PROTIME: 11.6 s (ref 10.2–13.1)

## 2018-11-11 LAB — MAGNESIUM: Magnesium:MCnc:Pt:Ser/Plas:Qn:: 1.8

## 2018-11-11 LAB — CHLORIDE: Chloride:SCnc:Pt:Ser/Plas:Qn:: 104

## 2018-11-11 LAB — AST (SGOT): Aspartate aminotransferase:CCnc:Pt:Ser/Plas:Qn:: 22

## 2018-11-11 LAB — PROLACTIN: Prolactin:MCnc:Pt:Ser/Plas:Qn:: 10.4

## 2018-11-11 NOTE — Unmapped (Signed)
Labs drawn and sent for analysis.  Care provided by  [employer]

## 2018-11-11 NOTE — Unmapped (Signed)
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 He has been asymptomatic.  He has had RT to the prostate in the past.  He is on clinical trial CPI-1201-201 (ProStar) on the enzalutamide monotherapy arm.  He is doing well on the trial with no new complaints.    Plan:  - Continue Lupron every three months (10/14/18).  - Continue CPI-1201-201 (ProStar) trial, enzalutamide monotherapy arm.  - I previously asked Radiation Oncology to weigh in about potential RT to the lymph nodes, and they felt this would not be beneficial and would confer toxicity without likely long-term disease control.    - Continue BP meds started w abiraterone.  Continue blood pressure control with his PCP.  - Clinical genetics saw him, negative genomic screening.  - Follow PSA.  - Nocturia: persistent, does not want Flomax.   - Anxiety: On Lexapro - Pharmacy ran interactions for both olaparib and abiraterone.  - Smoking: He understands risks, and the [facility] smoking cessation program here has been meeting with him for counseling.  - Bone density: Checked again in 6/19 and although only mildly low, had 10% loss in bone density from 2016-2019 on Lupron and now prednisone.  On Fosamax, calcium/D.    Follow up per trial.     -------------------------------------------------------    Referring physician:  Riki Altes, MD [facility])  Other physician:  Irven Easterly, MD    CC: Prostate cancer to lymph nodes and possibly bone s/p IMRT to prostate, with elevated PSA, on Lupron    Current therapy: Lupron    HPI:  2010: IMRT to prostate for T1c prostate adenocarcinoma, biopsy with up to Gleason 4+3=7.  Subsequent PSA rise with negative saturation biopsy on 11/17/12.  - 04/05/13: 8.3  - 08/29/13: 12.8  - 02/09/14: 13.3  10/2013: MRI with enlarged left obturator, iliac and para-aortic nodes up to 3 cm consistent with metastatic disease. Bilateral ischia and L pelvis with bony areas <41mm of unclear etiology (bone islands vs. metastases).  10/2013: Lupron started  Subsequent PSA:  - 05/01/14: 1.4  - 08/02/14: 1.4  - 11/03/14: 1.7  01/18/16: Bone scan: no metastases.  01/18/16: CT A/P: 2.6 cm left external iliac chain hyperenhancing node (series 6, image 57). Several prominent subcentimeter lymph nodes are seen along the left para-aortic retroperitoneum.  Prostate is enlarged measuring 6.0 x 6.7 x 9.3 cm. [Possible lipoma in gluteus maximus, or could be related to Lupron injection, discussed w urology, no action warranted.]  10/16/16: restaging:   - CT CAP: Interval increase in size of left external and common iliac chain as well as periaortic lymph nodes, worrisome for metastatic disease.   - Bone Scan: No evidence of osseous metastatic disease.  5/18: Abiraterone/prednisone  6/19: BMD (c/w 2016): Lumbar spine: ??Normal bone density. ??The measurement has decreased significantly since prior study.  Left proximal femur: Mildly low bone density. ??The measurement has decreased significantly since prior study.  04/15/18: PSA 10.6  08/04/18: PSA rising on abiraterone, now 16.7  08/04/18: Bone scan: No evidence of osseous metastasis.  08/04/18: CT CAP: Interval increase in external iliac, periaortic, and aortocaval lymphadenopathy concerning for disease progression.  1/20: Start enzalutamide on trial.      ROS: Nocturia stable at every 2-3 hours but drinks tea before bed, doesn't bother him.  No pain.  Fatigue.  Good appetite.  Has tolerated Lupron well.  Remainder of  10 system ROS negative.    PE:  ECOG 0  BP 152/67  - Pulse 54  - Temp 36.3 ??C (97.3 ??F) (Temporal)  - Resp 18  - Ht 172.7 cm (5' 8)  - Wt 81.9 kg (180 lb 8 oz)  - SpO2 99%  - BMI 27.44 kg/m??   NAD  A+Ox3  No rash  No LEE  No cords  Neuro non focal  CTA  RRR  Abd NT ND    Current Outpatient Medications   Medication Sig Dispense Refill   ??? alendronate (FOSAMAX) 70 MG tablet Take 1 tablet (70 mg total) by mouth every seven (7) days. 12.85 tablet 3   ??? ascorbic acid, vitamin C, (VITAMIN C) 250 MG tablet Take 250 mg by mouth.     ??? aspirin (ECOTRIN) 81 MG tablet Take 81 mg by mouth.     ??? benazepriL (LOTENSIN) 20 MG tablet Take 20 mg by mouth daily.     ??? calcium carbonate (OS-CAL) 600 mg calcium (1,500 mg) tablet Take 1 tablet by mouth daily.     ??? enzalutamide (XTANDI) 40 mg capsule Take 4 capsules (160 mg total) by mouth daily. 120 capsule 6   ??? escitalopram oxalate (LEXAPRO) 10 MG tablet Take 1 tablet (10 mg total) by mouth daily. 90 tablet 1   ??? hydroCHLOROthiazide (HYDRODIURIL) 12.5 MG tablet Take 6.25 mg by mouth daily.     ??? leuprolide (LUPRON) 22.5 mg injection Inject 22.5 mg into the muscle Every three (3) months.     ??? multivitamin (THERAGRAN) per tablet Take 1 tablet by mouth daily.     ??? tamsulosin (FLOMAX) 0.4 mg capsule Take 1 capsule (0.4 mg total) by mouth daily. 90 capsule 3     No current facility-administered medications for this visit.      Allergies   Allergen Reactions   ??? Valium [Diazepam] Other (See Comments)     Hallucinations, altered mental status  Hallucinations, altered mental status           Lab on 11/11/2018   Component Date Value Ref Range Status   ??? PT 11/11/2018 11.6  10.2 - 13.1 sec Final   ??? INR 11/11/2018 1.01   Final   ??? APTT 11/11/2018 35.2  25.9 - 39.5 sec Final   ??? Heparin Correlation 11/11/2018 0.2   Final    NOTE: This result is for use only with patients receiving heparin therapy in conjunction with the Heparin Nomogram.  In patients not receiving heparin, an elevated aPTT does not always imply anticoagulation.   ??? Sodium 11/11/2018 140  135 - 145 mmol/L Final   ??? Potassium 11/11/2018 4.3  3.5 - 5.0 mmol/L Final   ??? Chloride 11/11/2018 104  98 - 107 mmol/L Final   ??? CO2 11/11/2018 26.0  22.0 - 30.0 mmol/L Final   ??? BUN 11/11/2018 15  7 - 21 mg/dL Final   ??? Creatinine 11/11/2018 1.00  0.70 - 1.30 mg/dL Final   ??? EGFR CKD-EPI Non-African American,* 11/11/2018 70  >=60 mL/min/1.15m2 Final   ??? EGFR CKD-EPI African American, Male 11/11/2018 81  >=60 mL/min/1.66m2 Final   ??? Glucose 11/11/2018 112  70 - 179 mg/dL Final   ??? Calcium 16/06/9603 9.6  8.5 - 10.2 mg/dL Final   ??? Magnesium 54/05/8118 1.8  1.6 - 2.2 mg/dL Final   ??? Phosphorus 11/11/2018 4.2  2.9 - 4.7 mg/dL Final   ??? Albumin 14/78/2956 3.6  3.5 - 5.0 g/dL Final   ???  Total Bilirubin 11/11/2018 0.3  0.0 - 1.2 mg/dL Final   ??? ALT 16/06/9603 15  <50 U/L Final   ??? AST 11/11/2018 22  19 - 55 U/L Final   ??? Alkaline Phosphatase 11/11/2018 64  38 - 126 U/L Final   ??? LDH 11/11/2018 458  338 - 610 U/L Final   ??? Uric Acid 11/11/2018 4.3  4.0 - 9.0 mg/dL Final   ??? Creatine Kinase, Total 11/11/2018 57.0  45.0 - 250.0 U/L Final   ??? Triglycerides 11/11/2018 98  1 - 149 mg/dL Final   ??? Cholesterol 11/11/2018 143  100 - 199 mg/dL Final   ??? HDL 54/05/8118 46  40 - 59 mg/dL Final   ??? LDL Calculated 11/11/2018 77  60 - 99 mg/dL Final    NHLBI Recommended Ranges, LDL Cholesterol, for Adults (20+yrs) (ATPIII), mg/dL  Optimal              <147  Near Optimal        100-129  Borderline High     130-159  High                160-189  Very High            >=190  NHLBI Recommended Ranges, LDL Cholesterol, for Children (2-19 yrs), mg/dL  Desirable            <829  Borderline High     110-129  High                 >=130     ??? VLDL Cholesterol Cal 11/11/2018 19.6  12 - 42 mg/dL Final   ??? Chol/HDL Ratio 11/11/2018 3.1  <5.6 Final   ??? Non-HDL Cholesterol 11/11/2018 97  mg/dL Final    Non-HDL Cholesterol Recommended Ranges (mg/dL)  Optimal       <213  Near Optimal 130 - 159  Borderline High 160 - 189  High             190 - 219  Very High       >220     ??? FASTING 11/11/2018 Unknown   Final   ??? WBC 11/11/2018 7.9  4.5 - 11.0 10*9/L Final   ??? RBC 11/11/2018 4.13* 4.50 - 5.90 10*12/L Final   ??? HGB 11/11/2018 13.5  13.5 - 17.5 g/dL Final   ??? HCT 08/65/7846 40.8* 41.0 - 53.0 % Final   ??? MCV 11/11/2018 98.8  80.0 - 100.0 fL Final   ??? MCH 11/11/2018 32.7  26.0 - 34.0 pg Final   ??? MCHC 11/11/2018 33.1  31.0 - 37.0 g/dL Final   ??? RDW 96/29/5284 16.1* 12.0 - 15.0 % Final   ??? MPV 11/11/2018 8.1  7.0 - 10.0 fL Final   ??? Platelet 11/11/2018 371  150 - 440 10*9/L Final   ??? Neutrophils % 11/11/2018 64.3  % Final   ??? Lymphocytes % 11/11/2018 24.4  % Final   ??? Monocytes % 11/11/2018 5.3  % Final   ??? Eosinophils % 11/11/2018 4.2  % Final   ??? Basophils % 11/11/2018 0.5  % Final   ??? Absolute Neutrophils 11/11/2018 5.1  2.0 - 7.5 10*9/L Final   ??? Absolute Lymphocytes 11/11/2018 1.9  1.5 - 5.0 10*9/L Final   ??? Absolute Monocytes 11/11/2018 0.4  0.2 - 0.8 10*9/L Final   ??? Absolute Eosinophils 11/11/2018 0.3  0.0 - 0.4 10*9/L Final   ??? Absolute Basophils 11/11/2018 0.0  0.0 - 0.1 10*9/L  Final   ??? Large Unstained Cells 11/11/2018 1  0 - 4 % Final   ??? Macrocytosis 11/11/2018 Moderate* Not Present Final   ??? Anisocytosis 11/11/2018 Slight* Not Present Final       Lab Results   Component Value Date    PSA 20.10 (H) 10/14/2018    PSA 19.80 (H) 09/29/2018    PSA 15.50 (H) 08/12/2018    PSA 16.70 (H) 08/04/2018    PSA 14.50 (H) 07/15/2018    PSA 10.60 (H) 04/15/2018

## 2018-11-11 NOTE — Unmapped (Signed)
Nice seeing you today.   If you have any questions please call the Nurse Navigator in the office at [phone], or [phone]) [phone].  Frederic Jericho, MD

## 2018-11-13 LAB — DHEA SULFATE: Dehydroepiandrosterone sulfate:MCnc:Pt:Ser/Plas:Qn:: 11

## 2018-11-13 LAB — SEX HORMONE BINDING GLOBULIN: Sex hormone binding globulin:SCnc:Pt:Ser/Plas:Qn:: 136 — ABNORMAL HIGH

## 2018-11-14 LAB — TESTOSTERONE (MAYO): Testosterone:MCnc:Pt:Ser/Plas:Qn:: <7 — ABNORMAL LOW

## 2018-11-16 LAB — DIHYDROTESTOSTERONE
Androstanolone:MCnc:Pt:Ser/Plas:Qn:: <50 — ABNORMAL LOW
DIHYDROTESTOSTERONE: <50 pg/mL — ABNORMAL LOW

## 2018-11-17 LAB — ANDROSTENEDIONE
ANDROSTENEDIONE: <16 ng/dL — ABNORMAL LOW
Androstenedione:MCnc:Pt:Ser/Plas:Qn:: <16 — ABNORMAL LOW

## 2018-11-18 LAB — DHEA: Dehydroepiandrosterone:MCnc:Pt:Ser/Plas:Qn:: <0.5

## 2018-11-19 NOTE — Unmapped (Signed)
Clinical Study: CPI-1205-201: A Phase 1b/2 Study of CPI-1205, a Small Molecule Inhibitor of EZH2, Combined with Enzalutamide or Abiraterone/Prednisone in Patients with Metastatic Castration Resistant Prostate Cancer     Study Treatment: Randomized Arm Phase 2 (Enzalutamide ??? Control Arm)     Date of Service: 23JAN2020     Cycle / Day: C2D1     Performance Status: ECOG PS: 0     Narrative: Patient is in clinic for C2D1 of CPI-1205 protocol therapy accompanied by his wife, [name]. Physical exam performed by Frederic Jericho, MD. Labs drawn prior to clinic visit and are WNL or deemed NCS by provider unless otherwise noted. He has no new complaints or symptoms to log as AEs. Patient randomized to control arm on 21JAN2020.     Plan: Patient will return to clinic in 4 weeks for C3D1 on 19MAR2020. Scans scheduled for 18MAR2020. Patient and wife extensively educated on side effects, dosing schedule, medication diary and both verbalized understanding. Contact information for study coordinator and Dr. Vernell Barrier provided and patient agrees to contact office with any questions or concerns.       Enzalutamide Med Compliance Log  Cycle  # Pills Dispensed  # Pills  Returned  # Pills  Dosed # Pills in Planned Dose % Dose Taken  Comments   Cycle 1 120 12 108 108 100    Cycle 2         Cycle 3         Cycle 4         Cycle 5         Cycle 6           Adverse Events Log:     Diagnosis Start Date   Stop Date Grade   Clinically significant (yes/no)                                                 Concomitant Medication Log: New medication highlighted in yellow.      Medication Dose/  Route Start Date Stop Date Indication Related to AE?  (yes/no, specify if yes)   Tamsulosin   0.4mg  PO QD 10JAN2020 Ongoing  Nocturia  Med Hx   Alendronate   70mg  PO Q Week 25JUL2019 Ongoing Health Maintenance Med Hx   Aspirin 81mg  PO  QD 30JAN2014 Ongoing Cardiac Prophylaxis Med Hx   Acetaminophen 500mg  PO Q6o PRN [id number] Ongoing Aches/Pains Med Hx Amlodipine-Benazepril 5/20mg  PO QD 09MAR2018 16JAN2020 Hypertension Med Hx   Escitalopram Oxalate 10mg  PO BID 01FEB2019 Ongoing  Anxiety/Depression Med Hx   Vitamin C   250mg  PO  3 tabs Q8o 01MAR2018 Ongoing General Health Med Hx   Calcium Carbonate 600mg  PO QD 24NOV2019 Ongoing Health Maintenance Med Hx   Prednisone 10mg  PO QD [id number] 07/2018 Prostate CA Med Hx   Abiraterone 250mg  PO 4 tab QD [id number] 07/2018 Prostate CA Med Hx   Multivitamin 1 Tab PO   QD 04FEB2015 Ongoing General Health Med Hx   Leuprolide 45mg  IM 04FEB2015 Ongoing Prostate CA Med Hx   Benazepril HCL 20mg  PO QD 17JAN2020 Ongoing Hypertension Med Hx      Medical History  Diagnosis Start Date   Stop Date Grade   Clinically significant (yes/no)   Inguinal Hernia [id number] 2 No   Allergic Rhinitis 1995 1995 2 No   Bilateral Lower Extremity Edema 16DEC2019  Ongoing 1 No   Hypertension 03MAR2013 Ongoing 2 No   Nocutria [id number] Ongoing 1 No   Fatigue unAPR2013 Ongoing 1 No   Urinary Urgency [id number] Ongoing 1 No   Urinary Retention unAPR2013 Ongoing 1 No   Depression 01FEB2019 Ongoing 2 No   Anxiety 01FEB2019 Ongoing 2 No         Ezzard Flax, LPNII, CCRP  Study Coordinator  Pgr: [phone]

## 2018-11-30 NOTE — Unmapped (Signed)
[facility] Refill Coordination Note    Specialty Medication(s) to be Shipped:   Hematology/Oncology: John Watkins 40 mg       John Watkins, DOB: [birth year]  Phone: [phone] (home)       All above HIPAA information was verified with patient's family member.     Completed refill call assessment today to schedule patient's medication shipment from the [facility] [phone]).       Specialty medication(s) and dose(s) confirmed: Regimen is correct and unchanged.   Changes to medications: Napolean reports no changes reported at this time.  Changes to insurance: No  Questions for the pharmacist: No    Confirmed patient received Welcome Packet with first shipment. The patient will receive a drug information handout for each medication shipped and additional FDA Medication Guides as required.       DISEASE/MEDICATION-SPECIFIC INFORMATION        N/A    SPECIALTY MEDICATION ADHERENCE     Medication Adherence    Specialty Medication:  Xtandi 40 mg  Patient is on additional specialty medications:  No  Patient is on more than two specialty medications:  No  Demonstrates understanding of importance of adherence:  no  Informant:  spouse  Reliability of informant:  reliable  Provider-estimated medication adherence level:  good  Adherence tools used:  medication list   Other adherence tool:  routine   Support network for adherence:  family member  Confirmed plan for next specialty medication refill:  delivery by pharmacy          Refill Coordination    Has the Patients' Contact Information Changed:  No  Is the Shipping Address Different:  No           Xtandi 40 mg: 8 days of medicine on hand       SHIPPING     Shipping address confirmed in Epic.     Delivery Scheduled: Yes, Expected medication delivery date: 12/08/2018.     Medication will be delivered via Next Day Courier to the home address in Epic [place].    [name]   [facility] Specialty Technician

## 2018-12-01 NOTE — Unmapped (Signed)
Patient's wife Fran Lowes called asking if he still needed to do his scans next week with the the virus thing. Advised that yes as of right now he still has to come. She is also stated he is was very fatigue last week and 2 days could not even get up out of the chair. Explained that I would reach out to Dr. Vernell Barrier, otherwise we would see him next week. Dr. Vernell Barrier is not in the office on Thursday and the patient will see Ardean Larsen, NP

## 2018-12-06 NOTE — Unmapped (Signed)
Spoke with [facility], patient's wife to verify we can move up his appointment from 12:30pm on 19MAR2020 to 10:30am on the same say. The patient is scheduled to see Ardean Larsen, FNP

## 2018-12-07 MED FILL — XTANDI 40 MG CAPSULE: ORAL | 30 days supply | Qty: 120 | Fill #2

## 2018-12-07 MED FILL — XTANDI 40 MG CAPSULE: 30 days supply | Qty: 120 | Fill #2 | Status: AC

## 2018-12-08 ENCOUNTER — Ambulatory Visit: Admit: 2018-12-08 | Discharge: 2018-12-21 | Payer: MEDICARE

## 2018-12-08 ENCOUNTER — Ambulatory Visit: Admit: 2018-12-08 | Discharge: 2018-12-31 | Payer: MEDICARE

## 2018-12-08 DIAGNOSIS — Z7982 Long term (current) use of aspirin: Principal | ICD-10-CM

## 2018-12-08 DIAGNOSIS — R600 Localized edema: Principal | ICD-10-CM

## 2018-12-08 DIAGNOSIS — C61 Malignant neoplasm of prostate: Principal | ICD-10-CM

## 2018-12-08 DIAGNOSIS — Z79899 Other long term (current) drug therapy: Principal | ICD-10-CM

## 2018-12-08 DIAGNOSIS — C779 Secondary and unspecified malignant neoplasm of lymph node, unspecified: Principal | ICD-10-CM

## 2018-12-08 DIAGNOSIS — R351 Nocturia: Principal | ICD-10-CM

## 2018-12-08 DIAGNOSIS — Z79818 Long term (current) use of other agents affecting estrogen receptors and estrogen levels: Principal | ICD-10-CM

## 2018-12-08 DIAGNOSIS — Z7983 Long term (current) use of bisphosphonates: Principal | ICD-10-CM

## 2018-12-08 DIAGNOSIS — F419 Anxiety disorder, unspecified: Principal | ICD-10-CM

## 2018-12-08 NOTE — Unmapped (Signed)
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 He has been asymptomatic.  He has had RT to the prostate in the past.  He is on clinical trial CPI-1201-201 (ProStar) on the enzalutamide monotherapy arm.  He is doing well on the trial with only minimal fatigue, however, he reports continuing to chop wood and perform all daily tasks without issue.     We reviewed imaging today reporting overall decrease in adenopathy and no new disease noted on CT or NM Bone scan. Target lesions both reduced in size per RECIST read.       Plan:  - Continue Lupron every three months (10/14/18).   - Continue CPI-1201-201 (ProStar) trial, enzalutamide monotherapy arm.  - I previously asked Radiation Oncology to weigh in about potential RT to the lymph nodes, and they felt this would not be beneficial and would confer toxicity without likely long-term disease control.    - Continue BP meds previously started w on abiraterone.  Continue blood pressure control with his PCP.  - Clinical genetics saw him, negative genomic screening.  - Follow PSA. Continues to downtrend  - Nocturia: persistent, does not want Flomax.   - Anxiety: On Lexapro - Pharmacy ran for interactions   - Smoking: He understands risks, and the [employer] smoking cessation program here has been meeting with him for counseling.  - Bone density: Checked again in 6/19 and although only mildly low, had 10% loss in bone density from 2016-2019 on Lupron and now prednisone.  On Fosamax, calcium/D.  - Hypothyroidism: TSH elevated to 86 today, fT4 mildly decreased. Will start synthroid and repeat labs in 4-6 weeks.  - Grade 1 fatigue: likely multifactorial and may improve with thyroid replacement. CTM     Follow up per trial     -------------------------------------------------------    Referring physician:  Riki Altes, MD [facility]) Other physician:  Irven Easterly, MD    CC: Prostate cancer to lymph nodes and possibly bone s/p IMRT to prostate, with elevated PSA, on Lupron    Current therapy: Lupron + clinical trial CPI-1201-201 (ProStar)    HPI:  2010: IMRT to prostate for T1c prostate adenocarcinoma, biopsy with up to Gleason 4+3=7.  Subsequent PSA rise with negative saturation biopsy on 11/17/12.  - 04/05/13: 8.3  - 08/29/13: 12.8  - 02/09/14: 13.3  10/2013: MRI with enlarged left obturator, iliac and para-aortic nodes up to 3 cm consistent with metastatic disease. Bilateral ischia and L pelvis with bony areas <78mm of unclear etiology (bone islands vs. metastases).  10/2013: Lupron started  Subsequent PSA:  - 05/01/14: 1.4  - 08/02/14: 1.4  - 11/03/14: 1.7  01/18/16: Bone scan: no metastases.  01/18/16: CT A/P: 2.6 cm left external iliac chain hyperenhancing node (series 6, image 57). Several prominent subcentimeter lymph nodes are seen along the left para-aortic retroperitoneum.  Prostate is enlarged measuring 6.0 x 6.7 x 9.3 cm. [Possible lipoma in gluteus maximus, or could be related to Lupron injection, discussed w urology, no action warranted.]  10/16/16: restaging:   - CT CAP: Interval increase in size of left external and common iliac chain as well as periaortic lymph nodes, worrisome for metastatic disease.   - Bone Scan: No evidence of osseous metastatic disease.  5/18: Abiraterone/prednisone  6/19: BMD (c/w 2016): Lumbar spine: ??Normal bone density. ??The measurement has decreased significantly since prior study.  Left proximal  femur: Mildly low bone density. ??The measurement has decreased significantly since prior study.  04/15/18: PSA 10.6  08/04/18: PSA rising on abiraterone, now 16.7  08/04/18: Bone scan: No evidence of osseous metastasis.  08/04/18: CT CAP: Interval increase in external iliac, periaortic, and aortocaval lymphadenopathy concerning for disease progression.  1/20: Start enzalutamide on trial.  12/08/2018: CT CAP + NM Bone scan- CT with overall decrease in adenopathy, no new disease; Bone scan also negative for any osseous diseae    ROS: Nocturia stable at every 2-3 hours but drinks tea before bed, doesn't bother him.  No pain.  Grade 1 Fatigue.  Good appetite.  Has tolerated Lupron well.  Remainder of 10 system ROS negative.    PE:  ECOG 0  BP 142/65  - Pulse 68  - Temp 36.4 ??C (97.5 ??F) (Oral)  - Resp 16  - Wt 81.6 kg (180 lb)  - SpO2 98%  - BMI 27.37 kg/m??   NAD  A+Ox3  No rash  No LEE  No cords  Neuro non focal  CTA  RRR  Abd NT ND    Current Outpatient Medications   Medication Sig Dispense Refill   ??? alendronate (FOSAMAX) 70 MG tablet Take 1 tablet (70 mg total) by mouth every seven (7) days. 12.85 tablet 3   ??? ascorbic acid, vitamin C, (VITAMIN C) 250 MG tablet Take 250 mg by mouth.     ??? aspirin (ECOTRIN) 81 MG tablet Take 81 mg by mouth.     ??? benazepriL (LOTENSIN) 20 MG tablet Take 20 mg by mouth daily.     ??? calcium carbonate (OS-CAL) 600 mg calcium (1,500 mg) tablet Take 1 tablet by mouth daily.     ??? enzalutamide (XTANDI) 40 mg capsule Take 4 capsules (160 mg total) by mouth daily. 120 capsule 6   ??? escitalopram oxalate (LEXAPRO) 10 MG tablet Take 1 tablet (10 mg total) by mouth daily. 90 tablet 1   ??? hydroCHLOROthiazide (HYDRODIURIL) 12.5 MG tablet Take 6.25 mg by mouth daily.     ??? leuprolide (LUPRON) 22.5 mg injection Inject 22.5 mg into the muscle Every three (3) months.     ??? multivitamin (THERAGRAN) per tablet Take 1 tablet by mouth daily.     ??? tamsulosin (FLOMAX) 0.4 mg capsule Take 1 capsule (0.4 mg total) by mouth daily. 90 capsule 3     No current facility-administered medications for this visit.      Allergies   Allergen Reactions   ??? Valium [Diazepam] Other (See Comments)     Hallucinations, altered mental status  Hallucinations, altered mental status           Lab on 12/09/2018   Component Date Value Ref Range Status   ??? Sodium 12/09/2018 138  135 - 145 mmol/L Final   ??? Potassium 12/09/2018 4.3  3.5 - 5.0 mmol/L Final   ??? Chloride 12/09/2018 104  98 - 107 mmol/L Final   ??? CO2 12/09/2018 25.0  22.0 - 30.0 mmol/L Final   ??? BUN 12/09/2018 16  7 - 21 mg/dL Final   ??? Creatinine 12/09/2018 0.98  0.70 - 1.30 mg/dL Final   ??? EGFR CKD-EPI Non-African American,* 12/09/2018 72  >=60 mL/min/1.38m2 Final   ??? EGFR CKD-EPI African American, Male 12/09/2018 83  >=60 mL/min/1.78m2 Final   ??? Glucose 12/09/2018 103  70 - 179 mg/dL Final   ??? Calcium 16/06/9603 9.8  8.5 - 10.2 mg/dL Final   ??? Magnesium 54/05/8118 1.9  1.6 - 2.2 mg/dL Final   ??? Phosphorus 12/09/2018 4.1  2.9 - 4.7 mg/dL Final   ??? Albumin 02/72/5366 3.6  3.5 - 5.0 g/dL Final   ??? Total Bilirubin 12/09/2018 0.5  0.0 - 1.2 mg/dL Final   ??? ALT 44/11/4740 12  <50 U/L Final   ??? AST 12/09/2018 20  19 - 55 U/L Final   ??? Alkaline Phosphatase 12/09/2018 67  38 - 126 U/L Final   ??? LDH 12/09/2018 459  338 - 610 U/L Final   ??? Uric Acid 12/09/2018 4.5  4.0 - 9.0 mg/dL Final   ??? Creatine Kinase, Total 12/09/2018 46.0  45.0 - 250.0 U/L Final   ??? Triglycerides 12/09/2018 112  1 - 149 mg/dL Final   ??? Cholesterol 12/09/2018 168  100 - 199 mg/dL Final   ??? HDL 59/56/3875 48  40 - 59 mg/dL Final   ??? LDL Calculated 12/09/2018 98  60 - 99 mg/dL Final    NHLBI Recommended Ranges, LDL Cholesterol, for Adults (20+yrs) (ATPIII), mg/dL  Optimal              <643  Near Optimal        100-129  Borderline High     130-159  High                160-189  Very High            >=190  NHLBI Recommended Ranges, LDL Cholesterol, for Children (2-19 yrs), mg/dL  Desirable            <329  Borderline High     110-129  High                 >=130     ??? VLDL Cholesterol Cal 12/09/2018 22.4  12 - 42 mg/dL Final   ??? Chol/HDL Ratio 12/09/2018 3.5  <5.1 Final   ??? Non-HDL Cholesterol 12/09/2018 120  mg/dL Final    Non-HDL Cholesterol Recommended Ranges (mg/dL)  Optimal       <884  Near Optimal 130 - 159  Borderline High 160 - 189  High             190 - 219  Very High       >220     ??? FASTING 12/09/2018 No   Final   ??? T4, Total 12/09/2018 3.79* 5.50 - 11.00 ug/dL Final   ??? WBC 16/60/6301 7.1  4.5 - 11.0 10*9/L Final   ??? RBC 12/09/2018 4.22* 4.50 - 5.90 10*12/L Final   ??? HGB 12/09/2018 13.9  13.5 - 17.5 g/dL Final   ??? HCT 60/06/9322 41.7  41.0 - 53.0 % Final   ??? MCV 12/09/2018 98.8  80.0 - 100.0 fL Final   ??? MCH 12/09/2018 32.8  26.0 - 34.0 pg Final   ??? MCHC 12/09/2018 33.2  31.0 - 37.0 g/dL Final   ??? RDW 55/73/2202 16.7* 12.0 - 15.0 % Final   ??? MPV 12/09/2018 8.0  7.0 - 10.0 fL Final   ??? Platelet 12/09/2018 363  150 - 440 10*9/L Final   ??? Neutrophils % 12/09/2018 54.2  % Final   ??? Lymphocytes % 12/09/2018 31.8  % Final   ??? Monocytes % 12/09/2018 7.1  % Final   ??? Eosinophils % 12/09/2018 4.1  % Final   ??? Basophils % 12/09/2018 0.6  % Final   ??? Absolute Neutrophils 12/09/2018 3.9  2.0 - 7.5 10*9/L Final   ??? Absolute Lymphocytes 12/09/2018  2.3  1.5 - 5.0 10*9/L Final   ??? Absolute Monocytes 12/09/2018 0.5  0.2 - 0.8 10*9/L Final   ??? Absolute Eosinophils 12/09/2018 0.3  0.0 - 0.4 10*9/L Final   ??? Absolute Basophils 12/09/2018 0.0  0.0 - 0.1 10*9/L Final   ??? Large Unstained Cells 12/09/2018 2  0 - 4 % Final   ??? Macrocytosis 12/09/2018 Moderate* Not Present Final   ??? Anisocytosis 12/09/2018 Slight* Not Present Final       Lab Results   Component Value Date    PSA 6.63 (H) 11/11/2018    PSA 20.10 (H) 10/14/2018    PSA 19.80 (H) 09/29/2018    PSA 15.50 (H) 08/12/2018    PSA 16.70 (H) 08/04/2018    PSA 14.50 (H) 07/15/2018

## 2018-12-09 ENCOUNTER — Ambulatory Visit: Admit: 2018-12-09 | Discharge: 2018-12-10 | Payer: MEDICARE | Attending: Family | Primary: Family

## 2018-12-09 ENCOUNTER — Other Ambulatory Visit: Admit: 2018-12-09 | Discharge: 2018-12-10 | Payer: MEDICARE

## 2018-12-09 DIAGNOSIS — R5383 Other fatigue: Principal | ICD-10-CM

## 2018-12-09 DIAGNOSIS — R350 Frequency of micturition: Principal | ICD-10-CM

## 2018-12-09 DIAGNOSIS — R3915 Urgency of urination: Principal | ICD-10-CM

## 2018-12-09 DIAGNOSIS — R799 Abnormal finding of blood chemistry, unspecified: Principal | ICD-10-CM

## 2018-12-09 DIAGNOSIS — C61 Malignant neoplasm of prostate: Principal | ICD-10-CM

## 2018-12-09 DIAGNOSIS — C775 Secondary and unspecified malignant neoplasm of intrapelvic lymph nodes: Secondary | ICD-10-CM

## 2018-12-09 DIAGNOSIS — R338 Other retention of urine: Principal | ICD-10-CM

## 2018-12-09 DIAGNOSIS — R972 Elevated prostate specific antigen [PSA]: Principal | ICD-10-CM

## 2018-12-09 DIAGNOSIS — N390 Urinary tract infection, site not specified: Principal | ICD-10-CM

## 2018-12-09 DIAGNOSIS — R791 Abnormal coagulation profile: Principal | ICD-10-CM

## 2018-12-09 DIAGNOSIS — E038 Other specified hypothyroidism: Principal | ICD-10-CM

## 2018-12-09 DIAGNOSIS — N401 Enlarged prostate with lower urinary tract symptoms: Principal | ICD-10-CM

## 2018-12-09 DIAGNOSIS — R351 Nocturia: Principal | ICD-10-CM

## 2018-12-09 DIAGNOSIS — R946 Abnormal results of thyroid function studies: Principal | ICD-10-CM

## 2018-12-09 DIAGNOSIS — E039 Hypothyroidism, unspecified: Secondary | ICD-10-CM | POA: Insufficient documentation

## 2018-12-09 LAB — CBC W/ AUTO DIFF
BASOPHILS ABSOLUTE COUNT: 0 10*9/L (ref 0.0–0.1)
EOSINOPHILS ABSOLUTE COUNT: 0.3 10*9/L (ref 0.0–0.4)
EOSINOPHILS RELATIVE PERCENT: 4.1 %
EOSINOPHILS RELATIVE PERCENT: 4.1 % — AB (ref 41.0–53.0)
HEMOGLOBIN: 13.9 g/dL (ref 13.5–17.5)
LARGE UNSTAINED CELLS: 2 % (ref 0–4)
LYMPHOCYTES ABSOLUTE COUNT: 2.3 10*9/L (ref 1.5–5.0)
LYMPHOCYTES RELATIVE PERCENT: 31.8 %
MEAN CORPUSCULAR HEMOGLOBIN CONC: 33.2 g/dL (ref 31.0–37.0)
MEAN CORPUSCULAR HEMOGLOBIN: 32.8 pg (ref 26.0–34.0)
MEAN CORPUSCULAR VOLUME: 98.8 fL (ref 80.0–100.0)
MONOCYTES ABSOLUTE COUNT: 0.5 10*9/L (ref 0.2–0.8)
MONOCYTES RELATIVE PERCENT: 7.1 %
NEUTROPHILS ABSOLUTE COUNT: 3.9 10*9/L (ref 2.0–7.5)
NEUTROPHILS RELATIVE PERCENT: 54.2 %
PLATELET COUNT: 363 10*9/L (ref 150–440)
RED BLOOD CELL COUNT: 4.22 10*12/L — ABNORMAL LOW (ref 4.50–5.90)
RED CELL DISTRIBUTION WIDTH: 16.7 % — ABNORMAL HIGH (ref 12.0–15.0)
WBC ADJUSTED: 7.1 10*9/L (ref 4.5–11.0)

## 2018-12-09 LAB — CREATININE
EGFR CKD-EPI AA MALE: 83 mL/min/1.73m2 (ref >=60–1.30)
EGFR CKD-EPI NON-AA MALE: 72 mL/min/{1.73_m2} (ref >=60)

## 2018-12-09 LAB — LIPID PANEL
CHOLESTEROL: 168 mg/dL (ref 100–199)
CHOLESTEROL: 168 mg/dL (ref 100–199)
LDL CHOLESTEROL CALCULATED: 98 mg/dL (ref 60–99)
TRIGLYCERIDES: 112 mg/dL (ref 1–149)
VLDL CHOLESTEROL CAL: 22.4 mg/dL (ref 12–42)

## 2018-12-09 LAB — ALBUMIN: ALBUMIN: 3.6 g/dL (ref 3.5–5.0)

## 2018-12-09 MED ORDER — LEVOTHYROXINE 50 MCG TABLET
ORAL_TABLET | Freq: Every day | ORAL | 11 refills | 0 days | Status: CP
Start: 2018-12-09 — End: 2019-01-06

## 2018-12-09 NOTE — Unmapped (Signed)
Called and left a message regarding labs results. Advised that a prescription was called in and will call back to make sure it was picked up and if they had any questions.

## 2018-12-09 NOTE — Unmapped (Addendum)
You were seen in the [facility] today. We will plan to:    1. Continue medications per protocol  2. We reviewed your imaging today with overall good response to treatment  3. All labs were within normal limits  4. Claritin or Zyrtec are fine for seasonal allergies  5. RTC per protocol          Ardean Larsen, FNP-BC  [facility]  [email or web address]          Contact information:    In the event of an emergency call 911    [facility]:  Phone: [phone]  Fax: [phone]    After hours/nights/weekends:  Hospital Operator: [phone]

## 2018-12-09 NOTE — Unmapped (Signed)
Labs drawn via venipuncture, sent for analysis. Care provided by Mickeal Needy, RN.

## 2018-12-09 NOTE — Unmapped (Signed)
1004:  Study tx plan not signed.  Coordinator paged.  Pt aware of delay

## 2018-12-10 NOTE — Unmapped (Signed)
Clinical Study: CPI-1205-201: A Phase 1b/2 Study of CPI-1205, a Small Molecule Inhibitor of EZH2, Combined with Enzalutamide or Abiraterone/Prednisone in Patients with Metastatic Castration Resistant Prostate Cancer     Study Treatment: Randomized Arm Phase 2 (Enzalutamide ??? Control Arm)     Date of Service: 19MAR2020     Cycle / Day: C3D1     Performance Status: ECOG PS: 0     Narrative: Patient is in clinic for C3D1 of CPI-1205 protocol therapy accompanied by his wife, [name]. Physical exam performed by Ardean Larsen, FNP. Labs drawn prior to clinic visit currently Gr 1 Hypothyroidism, will continue to monitor. Thermon Leyland will call in prescription for replacement to local pharmacy. Complaints of mild to moderate fatigue, not interfering with ADL???s. Patient randomized to control arm on 21JAN2020.      Plan: Patient will return to clinic in 4 weeks for C4D1 on 16APR2020 pending restrictions on office visits due to COVID-19. Scans scheduled for 13MAY2020. Patient and wife extensively educated on side effects, dosing schedule, medication diary and both verbalized understanding. Contact information for study coordinator and Dr. Vernell Barrier provided and patient agrees to contact office with any questions or concerns.       Enzalutamide Med Compliance Log  Cycle  # Pills Dispensed  # Pills  Returned  # Pills  Dosed # Pills in Planned Dose % Dose Taken  Comments   Cycle 1 120 12 108 108 100    Cycle 2 120 12 108 108 100    Cycle 3         Cycle 4         Cycle 5         Cycle 6           Adverse Events Log:     Diagnosis Start Date   Stop Date Grade   Attribution  1: Unrelated 2: Unlikely 3: Possible 4: Probable 5: Definite 6: Unknown CS/NCS       Enzalutamide Prednisone    Hypothyroidism 19MAR2020 Ongoing 1      Fatigue 11MAR2020 Ongoing 1                                              Concomitant Medication Log: New medication highlighted in yellow.      Medication Dose/  Route Start Date Stop Date Indication Related to AE? (yes/no, specify if yes)   Tamsulosin   0.4mg  PO QD 10JAN2020 Ongoing  Nocturia  Med Hx   Alendronate   70mg  PO Q Week 25JUL2019 Ongoing Health Maintenance Med Hx   Aspirin 81mg  PO  QD 30JAN2014 Ongoing Cardiac Prophylaxis Med Hx   Acetaminophen 500mg  PO Q6o PRN [id number] Ongoing Aches/Pains Med Hx   Amlodipine-Benazepril 5/20mg  PO QD 09MAR2018 16JAN2020 Hypertension Med Hx   Escitalopram Oxalate 10mg  PO BID 01FEB2019 Ongoing  Anxiety/Depression Med Hx   Vitamin C   250mg  PO  3 tabs Q8o 01MAR2018 Ongoing General Health Med Hx   Calcium Carbonate 600mg  PO QD 24NOV2019 Ongoing Health Maintenance Med Hx   Prednisone 10mg  PO QD [id number] 07/2018 Prostate CA Med Hx   Abiraterone 250mg  PO 4 tab QD [id number] 07/2018 Prostate CA Med Hx   Multivitamin 1 Tab PO   QD 04FEB2015 Ongoing General Health Med Hx   Leuprolide 45mg  IM 04FEB2015 Ongoing Prostate CA Med Hx   Benazepril HCL 20mg  PO QD  17JAN2020 Ongoing Hypertension Med Hx   Levothyroxine PO QD 19MAR2020 Ongoing Hypothyroidism A/E      Medical History  Diagnosis Start Date   Stop Date Grade   Clinically significant (yes/no)   Inguinal Hernia [id number] 2 No   Allergic Rhinitis 1995 1995 2 No   Bilateral Lower Extremity Edema 16DEC2019 Ongoing 1 No   Hypertension 03MAR2013 Ongoing 2 No   Nocutria [id number] Ongoing 1 No   Fatigue unAPR2013 Ongoing 1 No   Urinary Urgency [id number] Ongoing 1 No   Urinary Retention unAPR2013 Ongoing 1 No   Depression 01FEB2019 Ongoing 2 No   Anxiety 01FEB2019 Ongoing 2 No         Ezzard Flax, LPNII, CCRP  Study Coordinator  Pgr: [phone]

## 2018-12-10 NOTE — Unmapped (Signed)
Spoke with patient regarding labs and to compete QoL questionnaires. Patient answer all questions and were documented by [facility]. Patient aware that next visit may be completed over the phone and will may receive pill diaries in the mail. Patient verbalized understanding and was able to repeat directions.     Ezzard Flax, LPNII, CCRP  Study Coordinator  Ph: [phone]  Pgr: [phone]

## 2018-12-17 ENCOUNTER — Encounter
Admit: 2018-12-17 | Discharge: 2018-12-20 | Disposition: A | Discharge status: Home / Self Care | Payer: MEDICARE | Attending: Nurse Practitioner | Admitting: Internal Medicine

## 2018-12-17 ENCOUNTER — Ambulatory Visit
Admit: 2018-12-17 | Discharge: 2018-12-20 | Disposition: A | Discharge status: Home / Self Care | Payer: MEDICARE | Admitting: Internal Medicine

## 2018-12-17 DIAGNOSIS — N210 Calculus in bladder: Principal | ICD-10-CM

## 2018-12-17 DIAGNOSIS — Z66 Do not resuscitate: Principal | ICD-10-CM

## 2018-12-17 DIAGNOSIS — I10 Essential (primary) hypertension: Principal | ICD-10-CM

## 2018-12-17 DIAGNOSIS — N401 Enlarged prostate with lower urinary tract symptoms: Principal | ICD-10-CM

## 2018-12-17 DIAGNOSIS — E039 Hypothyroidism, unspecified: Principal | ICD-10-CM

## 2018-12-17 DIAGNOSIS — C61 Malignant neoplasm of prostate: Principal | ICD-10-CM

## 2018-12-17 DIAGNOSIS — E038 Other specified hypothyroidism: Principal | ICD-10-CM

## 2018-12-17 DIAGNOSIS — Z7982 Long term (current) use of aspirin: Principal | ICD-10-CM

## 2018-12-17 DIAGNOSIS — F419 Anxiety disorder, unspecified: Principal | ICD-10-CM

## 2018-12-17 DIAGNOSIS — R338 Other retention of urine: Principal | ICD-10-CM

## 2018-12-17 DIAGNOSIS — N320 Bladder-neck obstruction: Principal | ICD-10-CM

## 2018-12-17 DIAGNOSIS — R351 Nocturia: Principal | ICD-10-CM

## 2018-12-17 DIAGNOSIS — K5731 Diverticulosis of large intestine without perforation or abscess with bleeding: Principal | ICD-10-CM

## 2018-12-17 DIAGNOSIS — R972 Elevated prostate specific antigen [PSA]: Principal | ICD-10-CM

## 2018-12-17 DIAGNOSIS — F329 Major depressive disorder, single episode, unspecified: Principal | ICD-10-CM

## 2018-12-17 DIAGNOSIS — C775 Secondary and unspecified malignant neoplasm of intrapelvic lymph nodes: Principal | ICD-10-CM

## 2018-12-17 DIAGNOSIS — K922 Gastrointestinal hemorrhage, unspecified: Principal | ICD-10-CM

## 2018-12-17 DIAGNOSIS — C772 Secondary and unspecified malignant neoplasm of intra-abdominal lymph nodes: Principal | ICD-10-CM

## 2018-12-17 DIAGNOSIS — R3915 Urgency of urination: Principal | ICD-10-CM

## 2018-12-17 DIAGNOSIS — M810 Age-related osteoporosis without current pathological fracture: Principal | ICD-10-CM

## 2018-12-17 DIAGNOSIS — K648 Other hemorrhoids: Principal | ICD-10-CM

## 2018-12-17 DIAGNOSIS — D62 Acute posthemorrhagic anemia: Principal | ICD-10-CM

## 2018-12-17 DIAGNOSIS — N390 Urinary tract infection, site not specified: Principal | ICD-10-CM

## 2018-12-17 DIAGNOSIS — F17210 Nicotine dependence, cigarettes, uncomplicated: Principal | ICD-10-CM

## 2018-12-17 DIAGNOSIS — R350 Frequency of micturition: Principal | ICD-10-CM

## 2018-12-17 DIAGNOSIS — K921 Melena: Secondary | ICD-10-CM | POA: Diagnosis not present

## 2018-12-17 DIAGNOSIS — C779 Secondary and unspecified malignant neoplasm of lymph node, unspecified: Secondary | ICD-10-CM | POA: Diagnosis not present

## 2018-12-17 DIAGNOSIS — K5730 Diverticulosis of large intestine without perforation or abscess without bleeding: Secondary | ICD-10-CM | POA: Diagnosis not present

## 2018-12-17 DIAGNOSIS — K625 Hemorrhage of anus and rectum: Secondary | ICD-10-CM | POA: Diagnosis not present

## 2018-12-17 DIAGNOSIS — Z923 Personal history of irradiation: Secondary | ICD-10-CM | POA: Diagnosis not present

## 2018-12-17 DIAGNOSIS — I444 Left anterior fascicular block: Secondary | ICD-10-CM | POA: Diagnosis not present

## 2018-12-17 DIAGNOSIS — R69 Illness, unspecified: Secondary | ICD-10-CM | POA: Diagnosis not present

## 2018-12-17 DIAGNOSIS — R591 Generalized enlarged lymph nodes: Secondary | ICD-10-CM | POA: Diagnosis not present

## 2018-12-17 DIAGNOSIS — Z8546 Personal history of malignant neoplasm of prostate: Secondary | ICD-10-CM | POA: Diagnosis not present

## 2018-12-17 LAB — CBC W/ AUTO DIFF
BASOPHILS ABSOLUTE COUNT: 0.1 10*9/L — ABNORMAL LOW (ref 0.0–0.1)
BASOPHILS RELATIVE PERCENT: 0.6 %
EOSINOPHILS ABSOLUTE COUNT: 0.4 10*9/L (ref 0.0–0.4)
EOSINOPHILS RELATIVE PERCENT: 4.3 %
HEMOGLOBIN: 12.5 g/dL — ABNORMAL LOW (ref 13.5–17.5)
LARGE UNSTAINED CELLS: 2 % (ref 0–4)
LYMPHOCYTES ABSOLUTE COUNT: 2.9 10*9/L (ref 1.5–5.0)
LYMPHOCYTES RELATIVE PERCENT: 33.6 %
MEAN CORPUSCULAR HEMOGLOBIN CONC: 34 g/dL (ref 31.0–37.0)
MEAN CORPUSCULAR HEMOGLOBIN: 33.4 pg (ref 26.0–34.0)
MEAN CORPUSCULAR VOLUME: 98.3 fL (ref 80.0–100.0)
MEAN PLATELET VOLUME: 7.8 fL (ref 7.0–10.0)
MONOCYTES ABSOLUTE COUNT: 0.4 10*9/L (ref 0.2–0.8)
MONOCYTES RELATIVE PERCENT: 4.8 %
NEUTROPHILS ABSOLUTE COUNT: 4.7 10*9/L (ref 2.0–7.5)
NEUTROPHILS RELATIVE PERCENT: 54.7 %
PLATELET COUNT: 384 10*9/L (ref 150–440)
RED BLOOD CELL COUNT: 3.76 10*12/L — ABNORMAL LOW (ref 4.50–5.90)
WBC ADJUSTED: 8.5 10*9/L (ref 4.5–11.0)

## 2018-12-17 LAB — COMPREHENSIVE METABOLIC PANEL
ALBUMIN: 3.8 g/dL (ref 3.5–5.0)
ALKALINE PHOSPHATASE: 89 U/L (ref 38–126)
ANION GAP: 12 mmol/L (ref 7–15)
AST (SGOT): 18 U/L — ABNORMAL LOW (ref 19–55)
BILIRUBIN TOTAL: 0.4 mg/dL (ref 0.0–1.2)
BLOOD UREA NITROGEN: 21 mg/dL (ref 7–21)
BUN / CREAT RATIO: 24
CHLORIDE: 104 mmol/L (ref 98–107)
CHLORIDE: 104 mmol/L — ABNORMAL LOW (ref 98–107)
CO2: 25 mmol/L (ref 22.0–30.0)
CREATININE: 0.88 mg/dL (ref 0.70–1.30)
EGFR CKD-EPI AA MALE: >90 mL/min/{1.73_m2} (ref >=60)
EGFR CKD-EPI NON-AA MALE: 81 mL/min/{1.73_m2} (ref >=60)
GLUCOSE RANDOM: 130 mg/dL (ref 65–179)
POTASSIUM: 4.2 mmol/L (ref 3.5–5.0)
PROTEIN TOTAL: 6.9 g/dL (ref 6.5–8.3)
SODIUM: 141 mmol/L (ref 135–145)

## 2018-12-17 LAB — CBC
HEMATOCRIT: 32.4 % — ABNORMAL LOW (ref 41.0–53.0)
HEMOGLOBIN: 10.7 g/dL — ABNORMAL LOW (ref 13.5–17.5)
MEAN CORPUSCULAR HEMOGLOBIN CONC: 33 g/dL (ref 31.0–37.0)
MEAN CORPUSCULAR HEMOGLOBIN: 32.6 pg (ref 26.0–34.0)
MEAN CORPUSCULAR VOLUME: 98.9 fL (ref 80.0–100.0)
MEAN PLATELET VOLUME: 8.2 fL (ref 7.0–10.0)
RED BLOOD CELL COUNT: 3.27 10*12/L — ABNORMAL LOW (ref 4.50–5.90)
RED CELL DISTRIBUTION WIDTH: 15.8 % — ABNORMAL HIGH (ref 12.0–15.0)
WBC ADJUSTED: 8.7 10*9/L (ref 4.5–11.0)

## 2018-12-17 LAB — URINALYSIS WITH CULTURE REFLEX
BACTERIA: NONE SEEN /HPF
BILIRUBIN UA: NEGATIVE
BLOOD UA: NEGATIVE
GLUCOSE UA: NEGATIVE
GLUCOSE UA: NEGATIVE /HPF — AB (ref 1.005–<2)
KETONES UA: NEGATIVE
PH UA: 7.5 (ref 5.0–9.0)
PROTEIN UA: NEGATIVE
RBC UA: 1 /HPF (ref <3)
SPECIFIC GRAVITY UA: 1.02 (ref 1.005–1.040)
SQUAMOUS EPITHELIAL: 3 /HPF (ref 0–5)
UROBILINOGEN UA: 0.2
WBC UA: 3 /HPF — ABNORMAL HIGH (ref <2)

## 2018-12-17 LAB — PROTIME-INR: INR: 1.12 sec (ref 10.2–13.1)

## 2018-12-17 NOTE — Unmapped (Addendum)
[facility] Medicine   History and Physical    Assessment/Plan:  Principal Problem:    Rectal bleeding  Active Problems:    Enlarged prostate with lower urinary tract symptoms (LUTS)    Malignant neoplasm of prostate (CMS-HCC)    Hypothyroidism    HTN (hypertension)    Depression    John Watkins is a 83 y.o. male with a PMHx of HTN, hypothyroidism, depression and metastatic prostate cancer that presents to [facility] with rectal bleeding onset last night.    Rectal Bleeding: CTA noted significant burden of diverticula and descending colonic bleed. Does not particularly seem to be lower GI given maroon color, though slow bleed cannot be ruled out. Hgb downtrending in the ED with ongoing clots passing from below.  - clear liquids overnight, NPO at MN  - GoLYTELY   - no GI distress or changes to suspect GI pathogen  - PPI daily  - trend H/H q6hr  - ordered type check  - GI consulted    Metastatic Prostate Cancer: on lupron therapy q59mo and xtandi (on trial). Follows with [facility] (Dr Vernell Barrier)  - hold xtandi until discussing with onc, consult order placed  - continue flomax 0.4mg  PO QDay    HTN:  - hold ASA 2/2 GIB  - hold home lotensin and hydrochlorothiazide in the setting of bleed, can restart as indicated    Osteoporosis: in the setting of androgen deprivation therapy  - continue at home fosamax 70mg  on Monday's    Depression:   - continue home lexapro    Hypothyroidism:  - continue home synthroid    Code Status:  DNR/DNI - confirmed with patient and his wife in the ED. Surrogate decision maker would be his wife, [name].    ___________________________________________________________________    Chief Complaint  Chief Complaint   Patient presents with   ??? Rectal Bleeding       HPI:  John Watkins is a 83 y.o. male with a PMHx of LUTS, HTN, hypothyroidism, depression and metastatic prostate cancer that presents to [facility] with rectal bleeding onset last night.    [name] was in his normal state of health until last night when he found was blood in diaper with a bowel movement. During lunchtime today, he also noticed a lage amount of blood in his diaper without stool, so he decided to come to the ED. He has had 3 further episodes of passing blood since coming to the ED. He thinks that there was less blood the past time, but wife does not think so. He denies any pain when urinating and does not have a history of blood in his stool. His last colonoscopy was 10+ years ago and he reports no abnormalities at that time. He describes his stool as a maroon color with large clots. He did not have a sudden urge to go or any pressure. Last night he did not know he was having a bowel movement, he thought it was just gas. He is incontinent at baseline, but usually can tell when he is having a BM. He has been unable to tell he is passing blood. He denies having any recent abdominal pain, fever or gas. He reports having diarrhea last week after his follow up bone/PET scans, but no episodes other than that. States that he has diarrhea every time he has these scans done. His last normal bowel movement was yesterday morning. Denies constipation or straining to pass BM. He denies, chest pain, SOB, trouble breathing or dizziness.  In the ED he received 40mg  protonix and NS BOLUS.    Allergies:  Valium [diazepam]     Medications:   Prior to Admission medications    Medication Dose, Route, Frequency   alendronate (FOSAMAX) 70 MG tablet 70 mg, Oral, Every 7 days  Patient taking differently: Take 70 mg by mouth Every Monday.    ascorbic acid, vitamin C, (VITAMIN C) 250 MG tablet 250 mg, Oral   aspirin (ECOTRIN) 81 MG tablet 81 mg   benazepriL (LOTENSIN) 20 MG tablet 20 mg, Oral, Daily (standard)   calcium carbonate (OS-CAL) 600 mg calcium (1,500 mg) tablet 1 tablet, Oral, Daily (standard)   enzalutamide (XTANDI) 40 mg capsule 160 mg, Oral, Daily (standard)   escitalopram oxalate (LEXAPRO) 10 MG tablet 10 mg, Oral, Daily (standard) hydroCHLOROthiazide (HYDRODIURIL) 12.5 MG tablet 6.25 mg, Oral, Daily   leuprolide (LUPRON) 22.5 mg injection 22.5 mg, Intramuscular, Every 3 months   levothyroxine (SYNTHROID) 50 MCG tablet 50 mcg, Oral, Daily (standard)   multivitamin (THERAGRAN) per tablet 1 tablet, Daily (standard)   tamsulosin (FLOMAX) 0.4 mg capsule 0.4 mg, Oral, Daily (standard)       Medical History:  Past Medical History:   Diagnosis Date   ??? Elevated prostate specific antigen (PSA)    ??? HTN (hypertension)    ??? Hypertrophy of prostate with urinary obstruction and other lower urinary tract symptoms (LUTS)    ??? Hypothyroidism    ??? Malignant neoplasm of prostate (CMS-HCC)    ??? Nocturia    ??? Other specified retention of urine    ??? Urgency of urination    ??? Urinary frequency    ??? Urinary tract infection, site not specified        Surgical History:  Past Surgical History:   Procedure Laterality Date   ??? NO PAST SURGERIES         Social History:  Social History     Socioeconomic History   ??? Marital status: Married     Spouse name: Not on file   ??? Number of children: Not on file   ??? Years of education: Not on file   ??? Highest education level: Not on file   Occupational History   ??? Not on file   Social Needs   ??? Financial resource strain: Not on file   ??? Food insecurity     Worry: Not on file     Inability: Not on file   ??? Transportation needs     Medical: Not on file     Non-medical: Not on file   Tobacco Use   ??? Smoking status: Current Every Day Smoker     Packs/day: 0.50   ??? Smokeless tobacco: Never Used   ??? Tobacco comment: Counseling given 12/25/16.   Substance and Sexual Activity   ??? Alcohol use: No     Alcohol/week: 0.0 standard drinks   ??? Drug use: No   ??? Sexual activity: Not on file   Lifestyle   ??? Physical activity     Days per week: Not on file     Minutes per session: Not on file   ??? Stress: Not on file   Relationships   ??? Social Wellsite geologist on phone: Not on file     Gets together: Not on file     Attends religious service: Not on file     Active member of club or organization: Not on file     Attends meetings  of clubs or organizations: Not on file     Relationship status: Not on file   Other Topics Concern   ??? Not on file   Social History Narrative   ??? Not on file       Family History:  Family History   Problem Relation Age of Onset   ??? Thyroid disease Mother    ??? GU problems Neg Hx    ??? Kidney cancer Neg Hx    ??? Prostate cancer Neg Hx        Review of Systems:  10 systems reviewed and are negative unless otherwise mentioned in HPI      Physical Exam:  Temp:  [35.9 ??C (96.6 ??F)-36 ??C (96.8 ??F)] 35.9 ??C (96.6 ??F)  Heart Rate:  [77-85] 85  Resp:  [16-20] 16  BP: (141-145)/(65-67) 145/65  SpO2:  [96 %-98 %] 98 %  There is no height or weight on file to calculate BMI.    GEN: Well-appearing elderly gentleman, NAD, does not appear particularly pale  HEENT: PERRL. EOMI. No scleral icterus. MMM. No appreciable JVP.   CARDIO: RRR, no murmurs, rubs, or gallops.   RESPIRATORY: Clear bilaterally, no wheezes, rales, or rhonchi.   ABDOMEN: Bowel sounds present, soft, non-tender, non-distended. No appreciable organomegaly.  GU: dark colored, clotted appearing blood in chuck under patient, genitalia appears normal   EXTREMITIES: 2+ peripheral pulses, trace to +1 LE on the right, +1 to +2 to the mid shin on the left  SKIN: No rashes or other suspicious lesions  NEURO: CN II-XII grossly intact. Strength and sensation to light touch grossly intact throughout.     Test Results:  Data Review:    All lab results last 24 hours:    Recent Results (from the past 24 hour(s))   ECG 12 lead (Adult)    Collection Time: 12/17/18  1:41 PM   Result Value Ref Range    EKG Systolic BP  mmHg    EKG Diastolic BP  mmHg    EKG Ventricular Rate 68 BPM    EKG Atrial Rate 68 BPM    EKG P-R Interval 236 ms    EKG QRS Duration 94 ms    EKG Q-T Interval 432 ms    EKG QTC Calculation 459 ms    EKG Calculated P Axis 56 degrees    EKG Calculated R Axis -63 degrees    EKG Calculated T Axis 70 degrees    QTC Fredericia 450 ms   Comprehensive Metabolic Panel    Collection Time: 12/17/18  1:52 PM   Result Value Ref Range    Sodium 141 135 - 145 mmol/L    Potassium 4.2 3.5 - 5.0 mmol/L    Chloride 104 98 - 107 mmol/L    Anion Gap 12 7 - 15 mmol/L    CO2 25.0 22.0 - 30.0 mmol/L    BUN 21 7 - 21 mg/dL    Creatinine 0.27 2.53 - 1.30 mg/dL    BUN/Creatinine Ratio 24     EGFR CKD-EPI Non-African American, Male 40 >=60 mL/min/1.41m2    EGFR CKD-EPI African American, Male >90 >=60 mL/min/1.39m2    Glucose 130 65 - 179 mg/dL    Calcium 9.5 8.5 - 66.4 mg/dL    Albumin 3.8 3.5 - 5.0 g/dL    Total Protein 6.9 6.5 - 8.3 g/dL    Total Bilirubin 0.4 0.0 - 1.2 mg/dL    AST 18 (L) 19 - 55 U/L    ALT  13 <50 U/L    Alkaline Phosphatase 89 38 - 126 U/L   PT-INR    Collection Time: 12/17/18  1:52 PM   Result Value Ref Range    PT 12.9 10.2 - 13.1 sec    INR 1.12    Troponin I    Collection Time: 12/17/18  1:52 PM   Result Value Ref Range    Troponin I <0.060 <0.060 ng/mL   Type and Screen    Collection Time: 12/17/18  1:52 PM   Result Value Ref Range    Check Type Needed Type Check     Blood Type A POS     Antibody Screen NEG    Lipase Level    Collection Time: 12/17/18  1:52 PM   Result Value Ref Range    Lipase 231 44 - 232 U/L   Magnesium Level    Collection Time: 12/17/18  1:52 PM   Result Value Ref Range    Magnesium 2.0 1.6 - 2.2 mg/dL   Lactic Acid, Venous, Whole Blood    Collection Time: 12/17/18  1:52 PM   Result Value Ref Range    Lactate, Venous 1.4 0.5 - 1.8 mmol/L   CBC w/ Differential    Collection Time: 12/17/18  1:52 PM   Result Value Ref Range    WBC 8.5 4.5 - 11.0 10*9/L    RBC 3.76 (L) 4.50 - 5.90 10*12/L    HGB 12.5 (L) 13.5 - 17.5 g/dL    HCT 14.7 (L) 82.9 - 53.0 %    MCV 98.3 80.0 - 100.0 fL    MCH 33.4 26.0 - 34.0 pg    MCHC 34.0 31.0 - 37.0 g/dL    RDW 56.2 (H) 13.0 - 15.0 %    MPV 7.8 7.0 - 10.0 fL    Platelet 384 150 - 440 10*9/L    Neutrophils % 54.7 %    Lymphocytes % 33.6 %    Monocytes % 4.8 %    Eosinophils % 4.3 %    Basophils % 0.6 %    Absolute Neutrophils 4.7 2.0 - 7.5 10*9/L    Absolute Lymphocytes 2.9 1.5 - 5.0 10*9/L    Absolute Monocytes 0.4 0.2 - 0.8 10*9/L    Absolute Eosinophils 0.4 0.0 - 0.4 10*9/L    Absolute Basophils 0.1 0.0 - 0.1 10*9/L    Large Unstained Cells 2 0 - 4 %    Macrocytosis Slight (A) Not Present   Urinalysis with Culture Reflex    Collection Time: 12/17/18  2:24 PM   Result Value Ref Range    Color, UA Yellow     Clarity, UA Clear     Specific Gravity, UA 1.020 1.005 - 1.040    pH, UA 7.5 5.0 - 9.0    Leukocyte Esterase, UA Negative Negative    Nitrite, UA Negative Negative    Protein, UA Negative Negative    Glucose, UA Negative Negative    Ketones, UA Negative Negative    Urobilinogen, UA 0.2 mg/dL 0.2 - 2.0 mg/dL    Bilirubin, UA Negative Negative    Blood, UA Negative Negative    RBC, UA 1 <3 /HPF    WBC, UA 3 (H) <2 /HPF    Squam Epithel, UA 3 0 - 5 /HPF    Bacteria, UA None Seen None Seen /HPF    Mucus, UA Few (A) None Seen /HPF   Hemoglobin and Hematocrit    Collection Time: 12/17/18  4:12 PM  Result Value Ref Range    HGB 11.5 (L) 13.5 - 17.5 g/dL    HCT 16.1 (L) 09.6 - 53.0 %       Imaging: Radiology studies were personally reviewed and Cta Abdomen Pelvis W Wo Contrast    Result Date: 12/17/2018  EXAM: CTA abdomen and pelvis DATE: 12/17/2018 3:43 PM ACCESSION: [id number] UN DICTATED: 12/17/2018 3:50 PM INTERPRETATION LOCATION: [facility] CLINICAL INDICATION: 83 years old Male with Hx of metastatic prostate cancer with new rectal bleed. ; GI bleed  COMPARISON: 12/08/2018 TECHNIQUE: A spiral CTA scan was obtained with IV contrast from the lung bases to the pubic symphysis.  Multiplanar reformatted and MIP images were provided for further evaluation of the vessels. For selected cases, 3D volume rendered images are also provided. VASCULAR FINDINGS: Aortic and branch vessel atherosclerosis. Celiac trunk: Patent SMA: Patent Renal arteries: Single bilateral renal arteries are patent. IMA: Patent Right common iliac artery: Patent with moderate atherosclerosis Right external iliac artery: Patent with moderate atherosclerosis Right internal iliac artery: Patent with moderate atherosclerosis Right common femoral artery, SFA and profunda femoral artery: Patent Left internal iliac artery: Patent with moderate atherosclerosis Left external iliac artery: Patent with moderate atherosclerosis Left internal iliac artery: Patent with moderate atherosclerosis Left common femoral artery, SFA and profunda femoral artery: Patent NONVASCULAR FINDINGS: LINES AND TUBES: None. LOWER THORAX: Subsegmental atelectasis in the lung bases. HEPATOBILIARY: No focal hepatic lesions. The gallbladder is present and otherwise unremarkable. No biliary dilatation.  SPLEEN: Lobulated contour, unchanged. PANCREAS: Unremarkable. ADRENALS: 1.2 cm left adrenal adenoma. The right adrenal gland is unremarkable. KIDNEYS/URETERS: Unchanged right renal cyst and bilateral subcentimeter hypodensities which are too small to characterize. Symmetric nephrograms. No hydronephrosis. BLADDER: Mild bladder wall thickening. 1.1 cm stone in the left posterior bladder, unchanged PELVIC/REPRODUCTIVE ORGANS: Prostatomegaly. Brachytherapy seeds in the prostate. GI TRACT: In the descending colon there is a region of contrast extravasation with persistent contrast blush on delayed images (5:67, 9:31). Extensive colonic diverticulosis. The appendix is unremarkable. PERITONEUM/RETROPERITONEUM AND MESENTERY: No free air or fluid. LYMPH NODES: 3.7 x 2.5 cm left pelvic sidewall lymphadenopathy (5:144), similar to prior. Multiple additional enlarged retroperitoneal lymph nodes, for reference left periaortic lymph node measuring 1.4 cm (5:100), unchanged. VESSELS: The portal venous system is patent. The hepatic veins and IVC are unremarkable. BONES AND SOFT TISSUES: Multilevel degenerative change of the spine. Unchanged lipoma in the left gluteus medius. Tiny fat-containing umbilical hernia.     Acute GI bleed with evidence of active extravasation in the descending colon. Extensive colonic diverticulosis without evidence of acute diverticulitis. Mild bladder wall thickening and bladder stone are unchanged from prior and likely secondary to chronic bladder outlet obstruction secondary to prostatomegaly. Similar appearance of retroperitoneal and pelvic lymphadenopathy. The findings of this study were discussed via telephone with DR. Joelene Millin FENDER by Dr. Waynard Edwards on 12/17/2018 4:10 PM.      EKG: unchanged from previous tracing 10/14/2018, normal sinus rhythm and 1st degree AV block w/ left anterior fascicular block    Scribe's Attestation: Adelina Mings, MD obtained and performed the history, physical exam and medical decision making elements that were  entered into the chart. Documentation assistance was provided by me personally, a scribe. Signed by Angelia Mould, scribe, on December 17, 2018 at 4:51 PM.     I attest that I have reviewed the scribe note and that the components of the history of the present illness, the physical exam, and the assessment and plan documented were performed by me. I  verified and edited the documentation and performed the exam and medical decision making.     Adelina Mings, MD  Assistant Professor of Internal Medicine

## 2018-12-17 NOTE — Unmapped (Signed)
Patient complaint of rectal bleeding since yesterday night, patient had 2 episodes of blood stool

## 2018-12-17 NOTE — Unmapped (Signed)
1   John Watkins  Male, 83 y.o., [birth year]  MRN:   [id number]  Phone:   [phone] (H)  PCP:   Anastasio Champion, MD  Coverage:   [facility] MEDICARE ADV/[employer] MEDICARE ADV  Next Appt  With Hematology and Oncology (ADULT ONC LAB)  01/06/2019 at 11:00 AM   RE: ED   Received: Today   Message Contents   Kamilah Correia Claudina Lick, RN  Lynett Grimes; Chrisandra Netters, MD      ??      Enid Derry,     Just letting you know.     Soraya    Previous Messages      ----- Message -----   From: Lynett Grimes   Sent: 12/17/2018 ?? 1:00 PM EDT   To: Elijah Birk, RN   Subject: FW: ED ?? ?? ?? ?? ?? ?? ?? ?? ?? ?? ?? ?? ?? ?? ?? ?? ?? ?? ??     FYI   ----- Message -----   From: Lynett Grimes   Sent: 12/17/2018 ??12:44 PM EDT   To: Kennieth Francois, FNP, *   Subject: ED ?? ?? ?? ?? ?? ?? ?? ?? ?? ?? ?? ?? ?? ?? ?? ?? ?? ?? ?? ?? ??     I just directed Mr. Cline to the ED for severe rectal bleeding. His wife said this is the second day. Per the wife he is passing dark red blood with clots. She also said that he is acting like he is not in this world. They are heading to [facility] as this is closer for them then [facility].

## 2018-12-17 NOTE — Unmapped (Signed)
[facility]  Emergency Department Provider Note    ED Clinical Impression     Final diagnoses:   Gastrointestinal hemorrhage, unspecified gastrointestinal hemorrhage type (Primary)     Initial Impression, ED Course, Assessment and Plan     Impression: John Watkins is a 83 y.o. male with a PMHx of LUTS, HTN and metastatic prostate cancer that presents to the ED via POV with rectal bleeding onset last night    On exam, the patient is afebrile with reassuring vitals. Well-appearing and non-toxic. Normal cardiac exam. Lungs are clear. Maroon colored blood on rectal exam. No significant stool. Abdomen is soft and non-tender. No CVA tenderness. No lower extremity swelling. Non-focal neurologic exam. No rashes.     His presentation seems most consistent with a lower GI bleed. Upper GI bleed vs mass vs AVM vs diverticular bleed vs erosive prostate cancer are on the differential.     Plan for CT A/P, EKG, basic labs, troponin, UA, lactate, lipase and magnesium.    2:39 PM  CBC notable for mild anemia to 12.5. Hematocrit down to 36.9. CMP, lipase, lactate, magnesium and coags WNL. Negative troponin. Pending UA and CT A/P.    3:32 PM  UA unremarkable. Pending CT A/P.    4:11 PM Spoke with radiology. The patient has an active bleed in the descending colon.     5:06 PM Discussed with [facility] GI fellow Dr. Cleon Gustin. Recommended transfer to [facility].     7:12 PM Discussed with the hospitalist. He has also had 2 additional bloody bowel movements.     I have reviewed the triage vital signs and the nursing notes.  I have discussed the case with the ED Attending, Dr. Ezekiel Slocumb.    Additional Medical Decision Making     I independently visualized the radiology images.   I reviewed the patient's prior medical records.   I discussed the case with the consultant, admitting provider, radiologist, ED pharmacist etc.     Any labs and radiology results that were available during my care of the patient were independently reviewed by me and considered in my medical decision making.    Portions of this record have been created using Scientist, clinical (histocompatibility and immunogenetics). Dictation errors have been sought, but may not have been identified and corrected.  ____________________________________________    I have reviewed the triage vital signs and the nursing notes.     History     Chief Complaint  Rectal Bleeding      HPI   John Watkins is a 83 y.o. male with a PMHx of LUTS, HTN and metastatic prostate cancer that presents to the ED via POV with rectal bleeding onset last night. Patient reports two episodes of hematochezia, one last night and one today. He reports a significant amount of blood and mildly loose stool in each episode. He endorses mild gas. No pain noted. No recent chemotherapy or radiation. No history of GI bleeds or peptic ulcers. He takes a baby aspirin. No recent colonoscopy. No cardiac history besides hypertension. Patient endorses tobacco use. No recent falls. He denies fever, lightheadedness, syncopal episodes, dizziness, chest pain, abdominal pain, dysuria, increased confusion, shortness of breath or other medical concerns at this time. He does have a history of diverticulosis on prior CT scans.    Per chart review, patient is in a clinical trial (CPI-1201-201 ProStar trial, enzalutamide monotherapy arm). Patient is on Lupron.    Past Medical History:   Diagnosis Date   ??? Elevated prostate specific  antigen (PSA)    ??? Hypertrophy of prostate with urinary obstruction and other lower urinary tract symptoms (LUTS)    ??? Malignant neoplasm of prostate (CMS-HCC)    ??? Nocturia    ??? Other specified retention of urine    ??? Urgency of urination    ??? Urinary frequency    ??? Urinary tract infection, site not specified      Patient Active Problem List   Diagnosis   ??? Enlarged prostate with lower urinary tract symptoms (LUTS)   ??? Malignant neoplasm of prostate (CMS-HCC)   ??? Nocturia   ??? Urgency of urination   ??? Urinary frequency   ??? Prostate cancer metastatic to intrapelvic lymph node (CMS-HCC)   ??? Hot flashes   ??? Fatigue   ??? Abnormal finding of blood chemistry, unspecified    ??? Abnormal coagulation profile    ??? Abnormal results of thyroid function studies    ??? Other specified hypothyroidism     No past surgical history on file.    No current facility-administered medications for this encounter.     Current Outpatient Medications:   ???  alendronate (FOSAMAX) 70 MG tablet, Take 1 tablet (70 mg total) by mouth every seven (7) days., Disp: 12.85 tablet, Rfl: 3  ???  ascorbic acid, vitamin C, (VITAMIN C) 250 MG tablet, Take 250 mg by mouth., Disp: , Rfl:   ???  aspirin (ECOTRIN) 81 MG tablet, Take 81 mg by mouth., Disp: , Rfl:   ???  benazepriL (LOTENSIN) 20 MG tablet, Take 20 mg by mouth daily., Disp: , Rfl:   ???  calcium carbonate (OS-CAL) 600 mg calcium (1,500 mg) tablet, Take 1 tablet by mouth daily., Disp: , Rfl:   ???  enzalutamide (XTANDI) 40 mg capsule, Take 4 capsules (160 mg total) by mouth daily., Disp: 120 capsule, Rfl: 6  ???  escitalopram oxalate (LEXAPRO) 10 MG tablet, Take 1 tablet (10 mg total) by mouth daily., Disp: 90 tablet, Rfl: 1  ???  hydroCHLOROthiazide (HYDRODIURIL) 12.5 MG tablet, Take 6.25 mg by mouth daily., Disp: , Rfl:   ???  leuprolide (LUPRON) 22.5 mg injection, Inject 22.5 mg into the muscle Every three (3) months., Disp: , Rfl:   ???  levothyroxine (SYNTHROID) 50 MCG tablet, Take 1 tablet (50 mcg total) by mouth daily., Disp: 30 tablet, Rfl: 11  ???  multivitamin (THERAGRAN) per tablet, Take 1 tablet by mouth daily., Disp: , Rfl:   ???  tamsulosin (FLOMAX) 0.4 mg capsule, Take 1 capsule (0.4 mg total) by mouth daily., Disp: 90 capsule, Rfl: 3    Allergies  Valium [diazepam]    Family History   Problem Relation Age of Onset   ??? GU problems Neg Hx    ??? Kidney cancer Neg Hx    ??? Prostate cancer Neg Hx      Social History  Social History     Tobacco Use   ??? Smoking status: Current Every Day Smoker     Packs/day: 0.50   ??? Smokeless tobacco: Never Used   ??? Tobacco comment: Counseling given 12/25/16.   Substance Use Topics   ??? Alcohol use: No     Alcohol/week: 0.0 standard drinks   ??? Drug use: No     Review of Systems  Constitutional: Negative for fever.  Eyes: Negative for visual changes.  ENT: Negative for sore throat.  Cardiovascular: Negative for chest pain.  Respiratory: Negative for shortness of breath.  Gastrointestinal: Positive for rectal bleeding. Negative for abdominal  pain, vomiting or diarrhea.  Genitourinary: Negative for dysuria.  Musculoskeletal: Negative for back pain.  Skin: Negative for rash.  Neurological: Negative for headaches, focal weakness or numbness.    Physical Exam     ED Triage Vitals [12/17/18 1333]   Enc Vitals Group      BP 141/67      Heart Rate 77      SpO2 Pulse       Resp 20      Temp 36 ??C (96.8 ??F)      Temp Source Oral      SpO2 96 %     Constitutional: Alert and oriented. Elderly.  Eyes: Conjunctivae are normal.  ENT       Head: Normocephalic and atraumatic.       Nose: No congestion.       Mouth/Throat: Mucous membranes are moist.       Neck: No stridor.  Hematological/Lymphatic/Immunilogical: No cervical lymphadenopathy.  Cardiovascular: Normal rate, regular rhythm. Normal and symmetric distal pulses are present in all extremities.  Respiratory: Normal respiratory effort. Breath sounds are normal.  Gastrointestinal: Maroon colored stool in rectum. Soft and nontender. There is no CVA tenderness.  Musculoskeletal: Normal range of motion in all extremities.       Right lower leg: No tenderness or edema.       Left lower leg: No tenderness or edema.  Neurologic: Normal speech and language. No gross focal neurologic deficits are appreciated.  Skin: Skin is warm, dry and intact. No rash noted.  Psychiatric: Mood and affect are normal. Speech and behavior are normal.    Radiology     No results found.    I independently visualized these images.    ______________________________________________________ Documentation assistance was provided by Lorie Apley, Scribe, on December 17, 2018 at 1:43 PM for Annia Belt, MD.    Documentation assistance was provided by the scribe in my presence.  The documentation recorded by the scribe has been reviewed by me and accurately reflects the services I personally performed.           Francis Gaines, MD  Resident  12/17/18 [phone]

## 2018-12-17 NOTE — Unmapped (Signed)
Call from wife stating he has rectal bleeding x 2 days. Dark red blood with clots and she states he has alter mental status. Advised her to proceed to ED and I will notify [facility] and Dr. Vernell Barrier. She stated she wanted to go to [facility] as this is closer. [name] (wife) verbalized understanding that this is an emergency.

## 2018-12-18 LAB — HEMOGLOBIN AND HEMATOCRIT, BLOOD
HEMATOCRIT: 24.7 % — ABNORMAL LOW (ref 41.0–53.0)
HEMOGLOBIN: 9 g/dL — ABNORMAL LOW (ref 13.5–17.5)

## 2018-12-18 LAB — CBC: MEAN CORPUSCULAR VOLUME: 98.9 fL — ABNORMAL LOW (ref 80.0–100.0)

## 2018-12-18 NOTE — Unmapped (Signed)
Blood Consent:    I have advised John Watkins of her medical condition, and that the chances for her improvement or recovery will be significantly helped by receiving blood products by transfusion: Such as packed red blood cells, fresh frozen plasma, platelets or cryoprecipitate (blood products).    I have explained the benefits that are expected from the patient being transfused and, as well, the risk. The patient understands that although the blood products to be administered had been prepared and tested in accordance with strict scientific rules established by the American Association of Blood Banks, there is still a very small - approximately 1 and 70,000 for Hemolytic Reactions and approximately 1 and 190,000 for TRALI - chance of blood products could be incompatible with the patient's body and a transfusion reaction can occur. Although transfusion reactions can be treated successfully, the patient understands that on rare occasions they can be fatal (1 in 250,000 transfusions). The patient also understands that allergic reactions to blood products with hives, itching, and fever are more common but can be treated and may not even require the transfusion to be stopped. The patient understands that even with testing by the most up-to-date methods, there is a small chance that the blood products may contain a virus that may not be recognized as an infection for many months or years. Even with proper testing, I discussed with the patient that the chance for contracting viral Hepatitis B is approximately 1 in 200,000, Hepatitis C is approximately 1 in 2 million, and HIV is approximately 1 in 2 million.    I gave the patient an opportunity to ask questions regarding the transfusion of blood products and I answered those questions to the patient's satisfaction.     The patient's signature will be obtained by nursing staff on a consent form and subsequently placed in the patient's chart as further evidence that the patient has given consent to the administration of blood products.

## 2018-12-18 NOTE — Unmapped (Signed)
Oncology Consult H&P    Service requesting consult: Med L  Referring Physician: Caleen Essex*  Reason for Consult: mamagement of clinical trial drug    ASSESSMENT and PLAN    Mr. 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 Onoclogy is consulted for management of enzalutamide which is currently part of a clinical trial.    Mr. Ambrosia presents with an acute GI bleed with active extravasation visualized in the descending colon.  Hemoglobin is being trended with plans for colonoscopy if bleeding has not resolved by next week.    In terms of his prostate cancer, his current disease is present in pelvic lymph nodes and he is receiving lupron and enzalutamide as part of the CPI-1201-201. Per the team, the patient has not brought in his enzalutamide.  It is ok for him to miss a few doses of the enzalutamide though it is safe to resume in this setting given his GI bleed is unrelated to prostate cancer and is not associated with enzalutamide.  The only interaction could be if enzalutamide were causing uncontrolled hypertension making him more likely to bleed which is not the case.    Recommendations:  - ok to hold a few doses of enzalutamide while in house  - if he is still admitted on Monday, would have someone bring in his enzalutamide if able to    This patient has been staffed with Dr. Selena Batten. These recommendations were discussed with the primary team.     Please contact the oncology team at [phone] with any further questions.    Tammy Sours, MD  Hematology/Oncology Fellow, PGY4  December 18, 2018 10:26 AM    HISTORY OF PRESENT ILLNESS   Mr. 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    He follows with Dr. Vernell Barrier and was last seen on 12/09/18. He was started on CPI-1201-201 [employer]) trial after rising PSA and interval increase in external iliac/periaortic and aortocaval lymphadenoapthy concerning for disease progression while on abiraterone.    He then developed blood in his diaper about a week prior to admission. On the day of admission, he found a large amount of blood in his diaper without stool. Stool was described as maroon colored.Colonoscopy was greater than 10 years ago per notes. CTA Abdomen on admission revealed acute GI bleed with evidence of active extravasation in the descending colon.Extensive colonic diverticulosis without evidence of acute diverticulitis. GI consulted, if GI bleed not improved by early next week will pursue colonoscopy.  Otherwise, team will consult VIR for embolization if he develops unstable bleeding.    PAST MEDICAL HISTORY      Past Medical History:   Diagnosis Date   ??? Elevated prostate specific antigen (PSA)    ??? HTN (hypertension)    ??? Hypertrophy of prostate with urinary obstruction and other lower urinary tract symptoms (LUTS)    ??? Hypothyroidism    ??? Malignant neoplasm of prostate (CMS-HCC)    ??? Nocturia    ??? Other specified retention of urine    ??? Urgency of urination    ??? Urinary frequency    ??? Urinary tract infection, site not specified        PAST SURGICAL HISTORY  Past Surgical History:   Procedure Laterality Date   ??? NO PAST SURGERIES         SOCIAL HISTORY     Social History     Tobacco Use   Smoking Status Current Every Day Smoker   ??? Packs/day: 0.50   Smokeless Tobacco Never Used   Tobacco Comment    Counseling given 12/25/16.     Social History     Substance and Sexual Activity   Alcohol Use No   ??? Alcohol/week: 0.0 standard drinks     Social History     Social History Narrative   ??? Not on file       FAMILY HISTORY     Family History   Problem Relation Age of Onset   ??? Thyroid disease Mother    ??? GU problems Neg Hx ??? Kidney cancer Neg Hx    ??? Prostate cancer Neg Hx        MEDICATIONS and ALLERGIES     No current facility-administered medications on file prior to encounter.      Current Outpatient Medications on File Prior to Encounter   Medication Sig Dispense Refill   ??? alendronate (FOSAMAX) 70 MG tablet Take 1 tablet (70 mg total) by mouth every seven (7) days. (Patient taking differently: Take 70 mg by mouth Every Monday. ) 12.85 tablet 3   ??? ascorbic acid, vitamin C, (VITAMIN C) 250 MG tablet Take 250 mg by mouth.     ??? aspirin (ECOTRIN) 81 MG tablet Take 81 mg by mouth.     ??? benazepriL (LOTENSIN) 20 MG tablet Take 20 mg by mouth daily.     ??? calcium carbonate (OS-CAL) 600 mg calcium (1,500 mg) tablet Take 1 tablet by mouth daily.     ??? enzalutamide (XTANDI) 40 mg capsule Take 4 capsules (160 mg total) by mouth daily. 120 capsule 6   ??? escitalopram oxalate (LEXAPRO) 10 MG tablet Take 1 tablet (10 mg total) by mouth daily. 90 tablet 1   ??? hydroCHLOROthiazide (HYDRODIURIL) 12.5 MG tablet Take 6.25 mg by mouth daily.     ??? leuprolide (LUPRON) 22.5 mg injection Inject 22.5 mg into the muscle Every three (3) months.     ??? levothyroxine (SYNTHROID) 50 MCG tablet Take 1 tablet (50 mcg total) by mouth daily. 30 tablet 11   ??? multivitamin (THERAGRAN) per tablet Take 1 tablet by mouth daily.     ??? tamsulosin (FLOMAX) 0.4 mg capsule Take 1 capsule (0.4 mg total) by mouth daily. 90 capsule 3       REVIEW OF SYSTEMS     A 10-point review of systems was asked and negative except as noted in the HPI    PHYSICAL EXAM:      Vitals:    12/18/18 0737   BP: 117/62   Pulse: 71   Resp: 18   Temp: 36.9 ??C (98.4 ??F)   SpO2: 94%       Patient not examined given e-consult in the setting of COVID-19 pandemic    Patient Lines/Drains/Airways Status    Active Peripheral & Central Intravenous Access     Name:   Placement date:   Placement time:   Site:   Days:    Peripheral IV 12/17/18 Right Antecubital   12/17/18    1351    Antecubital   less than 1                LABORATORY and RADIOLOGY DATA:  Pertinent Laboratory Data:  Lab Results   Component Value Date    WBC 8.7 12/17/2018    HGB 9.0 (L) 12/18/2018    HCT 27.0 (L) 12/18/2018    PLT 344 12/17/2018     Lab Results   Component Value Date    NA 141 12/17/2018    K 4.2 12/17/2018    CL 104 12/17/2018    CO2 25.0 12/17/2018     Lab Results   Component Value Date    ALKPHOS 89 12/17/2018    BILITOT 0.4 12/17/2018    BILIDIR <0.10 07/15/2018    PROT 6.9 12/17/2018    ALBUMIN 3.8 12/17/2018    ALT 13 12/17/2018    AST 18 (L) 12/17/2018     Lab Results   Component Value Date    PT 12.9 12/17/2018    PT 11.6 11/11/2018    PT 12.5 09/29/2018    INR 1.12 12/17/2018    INR 1.01 11/11/2018    INR 1.09 09/29/2018       Creatinine Whole Blood, POC   Date Value Ref Range Status   10/16/2016 0.9 0.8 - 1.4 mg/dL Final   02/72/5366 0.9 0.8 - 1.4 mg/dL Final     Creatinine   Date Value Ref Range Status   12/17/2018 0.88 0.70 - 1.30 mg/dL Final   44/11/4740 5.95 0.70 - 1.30 mg/dL Final   63/87/5643 3.29 0.70 - 1.30 mg/dL Final     Calcium   Date Value Ref Range Status   12/17/2018 9.5 8.5 - 10.2 mg/dL Final   51/88/4166 9.8 8.5 - 10.2 mg/dL Final   03/21/1600 9.6 8.5 - 10.2 mg/dL Final     Albumin   Date Value Ref Range Status   12/17/2018 3.8 3.5 - 5.0 g/dL Final   09/32/3557 3.6 3.5 - 5.0 g/dL Final   32/20/2542 3.6 3.5 - 5.0 g/dL Final     LDH   Date Value Ref Range Status   12/09/2018 459 338 - 610 U/L Final   11/11/2018 458 338 - 610 U/L Final   10/28/2018 436 338 - 610 U/L Final     PSA:  12/09/18: 6.5  11/11/18: 6.6    Pertinent Imaging: CTA Abdomen/Pelvis:  12/17/18:Acute GI bleed with evidence of active extravasation in the descending colon.   ??  Extensive colonic diverticulosis without evidence of acute diverticulitis.  ??  Mild bladder wall thickening and bladder stone are unchanged from prior and likely secondary to chronic bladder outlet obstruction secondary to prostatomegaly.  ??  Similar appearance of retroperitoneal and pelvic lymphadenopathy.

## 2018-12-18 NOTE — Unmapped (Signed)
Care Management  Initial Transition Planning Assessment    John Watkins??is a 83 y.o.??male??with a PMHx of HTN, hypothyroidism, depression and metastatic prostate cancer??that presents to [facility] with rectal bleeding onset last night.            General  Care Manager assessed the patient by : In person interview with patient, Medical record review, Discussion with Clinical Care team  Orientation Level: Oriented X4  Who provides care at home?: (Independent at home)    Contact/Decision Maker  Extended Emergency Contact Information  Primary Emergency Contact: [facility] (Health Care decision maker)   Darden Amber of [place]  Home Phone: [phone]  Relation: Spouse  Patient's Cell #: [phone]    Legal Next of Kin / Guardian / POA / Advance Directives       Advance Directive (Medical Treatment)  Does patient have an advance directive covering medical treatment?: Patient does not have advance directive covering medical treatment., Patient would not like information.  Reason patient does not have an advance directive covering medical treatment:: Patient does not wish to complete one at this time  Information provided on advance directive:: No  Patient requests assistance:: No    Health Care Decision Maker [HCDM] (Medical & Mental Health Treatment)  Information provided on advance directive:: No  Patient requests assistance:: No         Patient Information  Lives with: Spouse/significant other, Family members    Type of Residence: Private residence(2 story home.  Patient's able to stya on first floor)        Location/Detail: [employer]    Support Systems: Family Members, Spouse    Responsibilities/Dependents at home?: Yes (Describe)(Has 61 year old great-grandson who he and spouse have provided care for since birth.)    Home Care services in place prior to admission?: No        Type of Residence: Mailing Address:  [address]  [place] [address]  Contacts: Accompanied by: Alone  Patient Phone Number: [phone]        Medical Provider(s): Ruel Favors, MD  Reason for Admission: Admitting Diagnosis:  Gastrointestinal hemorrhage, unspecified gastrointestinal hemorrhage type [K92.2]  Past Medical History:   has a past medical history of Elevated prostate specific antigen (PSA), HTN (hypertension), Hypertrophy of prostate with urinary obstruction and other lower urinary tract symptoms (LUTS), Hypothyroidism, Malignant neoplasm of prostate (CMS-HCC), Nocturia, Other specified retention of urine, Urgency of urination, Urinary frequency, and Urinary tract infection, site not specified.  Past Surgical History:   has a past surgical history that includes No past surgeries.   Previous admit date: N/A    Primary Insurance- Payor: AETNA MEDICARE ADV / Plan: AETNA MEDICARE ADV / Product Type: *No Product type* /   Secondary Insurance ??? None  Prescription Coverage ??? [employer] Advantage Plan  Preferred Pharmacy - [facility] - [place], [place] - [address]  [facility]    Transportation home: Private vehicle  Level of function prior to admission: Independent                Equipment Currently Used at Home: none       Currently receiving outpatient dialysis?: No       Financial Information       Need for financial assistance?: No(Denies Financial concerns)    Discharge Needs Assessment  Concerns to be Addressed: no discharge needs identified    Clinical Risk Factors: > 65, Multiple Diagnoses (Chronic)  Barriers to taking medications: No    Prior overnight hospital stay or ED visit in last 90 days: No    Readmission Within the Last 30 Days: no previous admission in last 30 days         Anticipated Changes Related to Illness: none    Equipment Needed After Discharge: none    Discharge Facility/Level of Care Needs:      Readmission  Risk of Unplanned Readmission Score: UNPLANNED READMISSION SCORE: 13%  Predictive Model Details           13% (Medium) Factors Contributing to Score Calculated 12/18/2018 13:35 25% Number of active Rx orders is 27   [place] Risk of Unplanned Readmission Model 14% Diagnosis of cancer is present     12% ECG/EKG order is present in last 6 months     12% Charlson Comorbidity Index is 8     9% Imaging order is present in last 6 months     9% Age is 83     8% Latest hemoglobin is low (9.0 g/dL)     7% Number of ED visits in last six months is 1     Readmitted Within the Last 30 Days? (No if blank)   Patient at risk for readmission?: No    Discharge Plan  Screen findings are: Care Manager reviewed the plan of the patient's care with the Multidisciplinary Team. No discharge planning needs identified at this time. Care Manager will continue to manage plan and monitor patient's progress with the team.    Expected Discharge Date: 12/27/18    Patient and/or family were provided with choice of facilities / services that are available and appropriate to meet post hospital care needs?: N/A       Initial Assessment complete?: Yes

## 2018-12-18 NOTE — Unmapped (Signed)
Pt arrived from outside hospital, Pt admission and assessment complete. Pt had several soft BPs, and is receiving fluid at 166mL/hr and one fluid bolus. Pt is actively drinking golytely in preparation for possible procedure during day shift. Pt had one medium sized, red/maroon bowel movement. Pt NPO since midnight. Hgb dropped from 12.5 at outside hospital to 10.7 at last recheck, hospitalist is aware.  Will continue to monitor.     Problem: Adult Inpatient Plan of Care  Goal: Plan of Care Review  Outcome: Ongoing - Unchanged  Goal: Patient-Specific Goal (Individualization)  Outcome: Ongoing - Unchanged  Goal: Absence of Hospital-Acquired Illness or Injury  Outcome: Ongoing - Unchanged  Goal: Optimal Comfort and Wellbeing  Outcome: Ongoing - Unchanged  Goal: Readiness for Transition of Care  Outcome: Ongoing - Unchanged  Goal: Rounds/Family Conference  Outcome: Ongoing - Unchanged

## 2018-12-18 NOTE — Unmapped (Signed)
GASTROENTEROLOGY INPATIENT CONSULTATION H&P      Requesting Attending Physician:  Caleen Essex*  Requesting Consult Service: BRBPR    Reason for Consult:    John Watkins is a 83 y.o. male seen in consultation at the request of Dr. Caleen Essex* for evaluation of BRBPR    Assessment and Recommendations:     John Watkins is a 83 y/o M with PMH of metastatic prostate cancer on lupron q94mo and xtandi (clinical trial), on ASA for primary prevention who presents with BRBPR/maroon stool with confirmed source of bleed on CTA in descending colon and extensive diverticulosis c/w diverticular bleed. Also on ddx includes colonic mass (metasases or primary CRC). Will likely need colonoscopy early next week to evaluate for source of bleed.    Plan:  - Clear liquid diet  - Continue to monitor Hb  - If urgent hemodynamically unstable bleed, recommend consultation of VIR for embolization  - Likely colonoscopy Monday (will need additional prep Sunday evening)    Thank you for this consult. We will continue to follow along. Please page the [facility] consult pager at [phone] with any further questions.    Tawni Carnes, MD, MPH  Gastroenterology Fellow, PGY-6  Pager [phone]    Patient seen and discussed with Dr. Gayla Medicus      HPI:     Chief Complaint: BRBPR    History of Present Illness:   This is a 83 y.o. year old male with PMH of metastatic prostate cancer on lupron q52mo and xtandi (clinical trial), on ASA for primary prevention who presents with BRBPR/maroon stool with confirmed source of bleed on CTA in descending colon and extensive diverticulosis c/w diverticular bleed  Was in his normal state of health until the evening of 3/26 when he noted maroon blood in his diaper. At lunch time yesterday he had more blood in his diaper so was brought to the ED where he had 3 further episodes of passing blood. Denies abdominal pain or fever. Did have some loose stools last week but no subsequent episodes.  In ED was noted to have a normal HR 70s, BP 140/60s. Initial Hb 12.5 (from basline 13.9) -> 11.5 after 500 cc IVFs -> 10.7 this morning. INR, PLT count and BMP completely normal. Given given IV protonix and prepped overnight. One red/maroon bowel movement.  Endorses occasional NSAID use. Takes ASA 81 mg for primary prevention. Denies ETOH. Smokes 1/2 PPD. No FH of CRC.  Colonoscopy >10 years ago that was reportedly normal    Review of Systems:  The balance of 12 systems reviewed is negative except as noted in the HPI.     Medical History:     Past Medical History:  Past Medical History:   Diagnosis Date   ??? Elevated prostate specific antigen (PSA)    ??? HTN (hypertension)    ??? Hypertrophy of prostate with urinary obstruction and other lower urinary tract symptoms (LUTS)    ??? Hypothyroidism    ??? Malignant neoplasm of prostate (CMS-HCC)    ??? Nocturia    ??? Other specified retention of urine    ??? Urgency of urination    ??? Urinary frequency    ??? Urinary tract infection, site not specified        Surgical History:  Past Surgical History:   Procedure Laterality Date   ??? NO PAST SURGERIES         Family History:  The patient's family history includes Thyroid disease in his  mother..    Medications:   Current Facility-Administered Medications   Medication Dose Route Frequency Provider Last Rate Last Dose   ??? acetaminophen (TYLENOL) tablet 500 mg  500 mg Oral Q4H PRN Stefani Dama, MD       ??? [START ON 12/20/2018] alendronate (FOSAMAX) tablet 70 mg  70 mg Oral Q Monday Stefani Dama, MD       ??? calcium carbonate (OS-CAL) tablet 600 mg of elem calcium  600 mg of elem calcium Oral Daily Salena Saner Guidici, MD   600 mg of elem calcium at 12/18/18 0903   ??? escitalopram oxalate (LEXAPRO) tablet 10 mg  10 mg Oral Daily Stefani Dama, MD   10 mg at 12/18/18 1610   ??? levothyroxine (SYNTHROID) tablet 50 mcg  50 mcg Oral Daily Stefani Dama, MD   50 mcg at 12/18/18 9604   ??? melatonin tablet 3 mg  3 mg Oral QPM Stefani Dama, MD   3 mg at 12/18/18 1828   ??? multivitamins, therapeutic with minerals tablet 1 tablet  1 tablet Oral Daily Stefani Dama, MD   1 tablet at 12/18/18 5409   ??? nicotine (NICODERM CQ) 14 mg/24 hr patch 1 patch  1 patch Transdermal Daily Salena Saner Guidici, MD       ??? nicotine polacrilex (NICORETTE) gum 2 mg  2 mg Buccal Q1H PRN Stefani Dama, MD        Or   ??? nicotine polacrilex (NICORETTE) lozenge 2 mg  2 mg Buccal Q1H PRN Stefani Dama, MD       ??? ondansetron (ZOFRAN-ODT) disintegrating tablet 8 mg  8 mg Oral Q8H PRN Stefani Dama, MD        Or   ??? ondansetron (ZOFRAN) injection 4 mg  4 mg Intravenous Q8H PRN Salena Saner Guidici, MD       ??? oxyCODONE (ROXICODONE) immediate release tablet 5 mg  5 mg Oral Q4H PRN Stefani Dama, MD        Or   ??? oxyCODONE (ROXICODONE) immediate release tablet 10 mg  10 mg Oral Q4H PRN Stefani Dama, MD       ??? pantoprazole (PROTONIX) EC tablet 40 mg  40 mg Oral Daily Stefani Dama, MD   40 mg at 12/18/18 8119   ??? sodium chloride (NS) 0.9 % infusion  100 mL/hr Intravenous Continuous Clarene Duke, MD 100 mL/hr at 12/18/18 1412 100 mL/hr at 12/18/18 1412   ??? tamsulosin (FLOMAX) 24 hr capsule 0.4 mg  0.4 mg Oral Daily Stefani Dama, MD   0.4 mg at 12/18/18 0903       Allergies:  Valium [diazepam]    Social History:  Social History     Tobacco Use   ??? Smoking status: Current Every Day Smoker     Packs/day: 0.50   ??? Smokeless tobacco: Never Used   ??? Tobacco comment: Counseling given 12/25/16.   Substance Use Topics   ??? Alcohol use: No     Alcohol/week: 0.0 standard drinks   ??? Drug use: No       Objective:      Vital Signs:  Temp:  [35.9 ??C-36.9 ??C] 36.9 ??C  Heart Rate:  [62-85] 71  Resp:  [16-20] 18  BP: (95-145)/(49-67) 117/62  MAP (mmHg):  [97] 97  SpO2:  [94 %-98 %] 94 %  BMI (Calculated):  [26.08] 26.08    Physical Exam:  Constitutional: Alert,  Oriented x 3, No acute distress, well nourished and well hydrated.   HEENT: PERRL, conjunctiva clear, anicteric, oropharynx clear, neck supple, no LAD.   CV: No JVD.  Lung: Unlabored breathing.   Abdomen: soft, normal bowel sounds, non-distended, non-tender. No organomegaly. No ascites.   Perianal/Rectal Exam: deferred (bloody stool in toilet)  Extremities: No edema, well perfused  MSK: No joint swelling or tenderness noted, no deformities  Skin: No rashes, jaundice or skin lesions noted  Neuro: No focal deficits. No asterixis.   Mental Status: Thought organized, appropriate affect, pleasantly interactive, not anxious appearing    Diagnostic Studies:  I reviewed all pertinent diagnostic studies, including:      Labs:    Recent Labs     12/17/18  1352 12/17/18  1612 12/17/18  2242 12/18/18  0845   WBC 8.5  --  8.7  --    HGB 12.5* 11.5* 10.7* 9.0*   HCT 36.9* 33.6* 32.4* 27.0*   PLT 384  --  344  --      Recent Labs     12/17/18  1352   NA 141   K 4.2   CL 104   BUN 21   CREATININE 0.88   GLU 130     Recent Labs     12/17/18  1352   PROT 6.9   ALBUMIN 3.8   AST 18*   ALT 13   ALKPHOS 89   BILITOT 0.4     Recent Labs     12/17/18  1352   INR 1.12     @LABRCNT (CRP:3)    Imaging:     CTA 12/17/18:  VASCULAR FINDINGS:   Aortic and branch vessel atherosclerosis.  ??  Celiac trunk: Patent  SMA: Patent  Renal arteries: Single bilateral renal arteries are patent.  IMA: Patent  ??  Right common iliac artery: Patent with moderate atherosclerosis  Right external iliac artery: Patent with moderate atherosclerosis  Right internal iliac artery: Patent with moderate atherosclerosis  Right common femoral artery, SFA and profunda femoral artery: Patent  Left internal iliac artery: Patent with moderate atherosclerosis  Left external iliac artery: Patent with moderate atherosclerosis  Left internal iliac artery: Patent with moderate atherosclerosis  Left common femoral artery, SFA and profunda femoral artery: Patent  ??  NONVASCULAR FINDINGS:  ??  LINES AND TUBES: None. LOWER THORAX: Subsegmental atelectasis in the lung bases.  ??  HEPATOBILIARY: No focal hepatic lesions. The gallbladder is present and otherwise unremarkable. No biliary dilatation.    SPLEEN: Lobulated contour, unchanged.  PANCREAS: Unremarkable.  ??  ADRENALS: 1.2 cm left adrenal adenoma. The right adrenal gland is unremarkable.  KIDNEYS/URETERS: Unchanged right renal cyst and bilateral subcentimeter hypodensities which are too small to characterize. Symmetric nephrograms. No hydronephrosis.  ??  BLADDER: Mild bladder wall thickening. 1.1 cm stone in the left posterior bladder, unchanged  PELVIC/REPRODUCTIVE ORGANS: Prostatomegaly. Brachytherapy seeds in the prostate.  ??  GI TRACT: In the descending colon there is a region of contrast extravasation with persistent contrast blush on delayed images (5:67, 9:31). Extensive colonic diverticulosis. The appendix is unremarkable.   ??  PERITONEUM/RETROPERITONEUM AND MESENTERY: No free air or fluid.  LYMPH NODES: 3.7 x 2.5 cm left pelvic sidewall lymphadenopathy (5:144), similar to prior. Multiple additional enlarged retroperitoneal lymph nodes, for reference left periaortic lymph node measuring 1.4 cm (5:100), unchanged.  VESSELS: The portal venous system is patent. The hepatic veins and IVC are unremarkable.  ??  BONES AND SOFT TISSUES: Multilevel degenerative  change of the spine. Unchanged lipoma in the left gluteus medius. Tiny fat-containing umbilical hernia.  ??  IMPRESSION:  Acute GI bleed with evidence of active extravasation in the descending colon.   ??  Extensive colonic diverticulosis without evidence of acute diverticulitis.  ??  Mild bladder wall thickening and bladder stone are unchanged from prior and likely secondary to chronic bladder outlet obstruction secondary to prostatomegaly.  ??  Similar appearance of retroperitoneal and pelvic lymphadenopathy.

## 2018-12-18 NOTE — Unmapped (Addendum)
Hospital Medicine Daily Progress Note    Assessment/Plan:  [name] is an 83 YO WM with a PMH significant for hypertension, hypothyroidism, and metastatic prostate cancer on a trial drug who presented to the [facility] with a 1-day history of rectal bleeding.  ??  ABLA secondary to LGIB: He presented to the ED with a 1-day history of bloody BMs.  He stated that the initial few BMs appeared maroon, but that they then became bright red.  At presentation, he was afebrile with occasional low blood pressures, but otherwise normal VS.  His physical exam was unremarkable.  His labs showed anemia (H/H of 12.5 / 36.9), but otherwise unremarkable Cbc / CMP / lipase / troponin / INR / lactate / UA.  His CTA A/P showed an acute GI bleed with evidence of active extravasation in the descending colon, extensive colonic diverticulosis without evidence of acute diverticulitis, mild bladder wall thickening and bladder stone unchanged from prior imaging and likely secondary to chronic bladder outlet obstruction secondary to prostatomegaly, and similar appearance of retroperitoneal and pelvic lymphadenopathy.  He was transferred to [facility] for further care.  After admission, he started a GoLytely prep and consumed approximately 2 L by the morning of 12/18/2018.  He reported continued bloody BMs on the morning of 12/18/2018 as well.  His H/H has slowly decreased to a current nadir of 9/27.     - GI medicine consulted:      ??  - CL diet (no red or purple food stuffs)    - If he becomes hemodynamically unstable with continued bleeding, stat VIR consult for embolization    - If his bleed does not resolve over the weekend, consider colonoscopy early next week   - Trend H/H Q6h, transfuse to goal hemoglobin >8 (consented for blood on 12/17/2018)   - IVFs   - Monitor pace and character of BMs   - Typed and screened on 12/17/2018    ??Metastatic prostate cancer: He is on lupron therapy q3 months and xtandi (on trial). Follows with [facility] oncology (Dr Vernell Barrier).   - Oncology consulted regarding his xtandi    - Continue flomax 0.4 mg PO daily  ??  HTN:   - Holding home lotensin and hydrochlorothiazide in the setting of bleed, can restart as indicated  ??  Osteoporosis: This is in the setting of androgen deprivation therapy.   - Continue at home fosamax 70 mg on Mondays  ??  Depression; anxiety:    - Continue home lexapro  ??  Hypothyroidism:   - Continue home synthroid  ??  Checklist  Antibiotics: None  Steroids: None  Prophylaxis: Ambulatory  Diet: CL  Code Status: DNR/DNI - confirmed with patient and his wife in the ED. Surrogate decision maker would be his wife, John Watkins.    Floor time 35 minutes minutes, > 50% spent in counseling and coordination of care about the following issues:  ABLA secondary to GIB, discussions with patient, GI medicine, oncology.  ___________________________________________________________________    Subjective:  John Watkins continues to have BRBPR this morning.  He drank about 2 L of GoLytely through the night.  He denies abdominal pain, nausea, vomiting, fevers, or chills.    All 10 systems reviewed and negative except as mentioned above.      Labs/Studies:    All lab results last 24 hours:    Recent Results (from the past 24 hour(s))   ECG 12 lead (Adult)    Collection Time: 12/17/18  1:41 PM  Result Value Ref Range    EKG Systolic BP  mmHg    EKG Diastolic BP  mmHg    EKG Ventricular Rate 68 BPM    EKG Atrial Rate 68 BPM    EKG P-R Interval 236 ms    EKG QRS Duration 94 ms    EKG Q-T Interval 432 ms    EKG QTC Calculation 459 ms    EKG Calculated P Axis 56 degrees    EKG Calculated R Axis -63 degrees    EKG Calculated T Axis 70 degrees    QTC Fredericia 450 ms   Comprehensive Metabolic Panel    Collection Time: 12/17/18  1:52 PM   Result Value Ref Range    Sodium 141 135 - 145 mmol/L    Potassium 4.2 3.5 - 5.0 mmol/L    Chloride 104 98 - 107 mmol/L    Anion Gap 12 7 - 15 mmol/L    CO2 25.0 22.0 - 30.0 mmol/L    BUN 21 7 - 21 mg/dL Creatinine 2.13 0.86 - 1.30 mg/dL    BUN/Creatinine Ratio 24     EGFR CKD-EPI Non-African American, Male 87 >=60 mL/min/1.47m2    EGFR CKD-EPI African American, Male >90 >=60 mL/min/1.66m2    Glucose 130 65 - 179 mg/dL    Calcium 9.5 8.5 - 57.8 mg/dL    Albumin 3.8 3.5 - 5.0 g/dL    Total Protein 6.9 6.5 - 8.3 g/dL    Total Bilirubin 0.4 0.0 - 1.2 mg/dL    AST 18 (L) 19 - 55 U/L    ALT 13 <50 U/L    Alkaline Phosphatase 89 38 - 126 U/L   PT-INR    Collection Time: 12/17/18  1:52 PM   Result Value Ref Range    PT 12.9 10.2 - 13.1 sec    INR 1.12    Troponin I    Collection Time: 12/17/18  1:52 PM   Result Value Ref Range    Troponin I <0.060 <0.060 ng/mL   Type and Screen    Collection Time: 12/17/18  1:52 PM   Result Value Ref Range    Check Type Needed Type Check     Blood Type A POS     Antibody Screen NEG    Lipase Level    Collection Time: 12/17/18  1:52 PM   Result Value Ref Range    Lipase 231 44 - 232 U/L   Magnesium Level    Collection Time: 12/17/18  1:52 PM   Result Value Ref Range    Magnesium 2.0 1.6 - 2.2 mg/dL   Lactic Acid, Venous, Whole Blood    Collection Time: 12/17/18  1:52 PM   Result Value Ref Range    Lactate, Venous 1.4 0.5 - 1.8 mmol/L   CBC w/ Differential    Collection Time: 12/17/18  1:52 PM   Result Value Ref Range    WBC 8.5 4.5 - 11.0 10*9/L    RBC 3.76 (L) 4.50 - 5.90 10*12/L    HGB 12.5 (L) 13.5 - 17.5 g/dL    HCT 46.9 (L) 62.9 - 53.0 %    MCV 98.3 80.0 - 100.0 fL    MCH 33.4 26.0 - 34.0 pg    MCHC 34.0 31.0 - 37.0 g/dL    RDW 52.8 (H) 41.3 - 15.0 %    MPV 7.8 7.0 - 10.0 fL    Platelet 384 150 - 440 10*9/L    Neutrophils % 54.7 %  Lymphocytes % 33.6 %    Monocytes % 4.8 %    Eosinophils % 4.3 %    Basophils % 0.6 %    Absolute Neutrophils 4.7 2.0 - 7.5 10*9/L    Absolute Lymphocytes 2.9 1.5 - 5.0 10*9/L    Absolute Monocytes 0.4 0.2 - 0.8 10*9/L    Absolute Eosinophils 0.4 0.0 - 0.4 10*9/L    Absolute Basophils 0.1 0.0 - 0.1 10*9/L    Large Unstained Cells 2 0 - 4 %    Macrocytosis Slight (A) Not Present   Urinalysis with Culture Reflex    Collection Time: 12/17/18  2:24 PM   Result Value Ref Range    Color, UA Yellow     Clarity, UA Clear     Specific Gravity, UA 1.020 1.005 - 1.040    pH, UA 7.5 5.0 - 9.0    Leukocyte Esterase, UA Negative Negative    Nitrite, UA Negative Negative    Protein, UA Negative Negative    Glucose, UA Negative Negative    Ketones, UA Negative Negative    Urobilinogen, UA 0.2 mg/dL 0.2 - 2.0 mg/dL    Bilirubin, UA Negative Negative    Blood, UA Negative Negative    RBC, UA 1 <3 /HPF    WBC, UA 3 (H) <2 /HPF    Squam Epithel, UA 3 0 - 5 /HPF    Bacteria, UA None Seen None Seen /HPF    Mucus, UA Few (A) None Seen /HPF   Hemoglobin and Hematocrit    Collection Time: 12/17/18  4:12 PM   Result Value Ref Range    HGB 11.5 (L) 13.5 - 17.5 g/dL    HCT 09.8 (L) 11.9 - 53.0 %   ABO/RH    Collection Time: 12/17/18  8:43 PM   Result Value Ref Range    Blood Type A POS    CBC    Collection Time: 12/17/18 10:42 PM   Result Value Ref Range    WBC 8.7 4.5 - 11.0 10*9/L    RBC 3.27 (L) 4.50 - 5.90 10*12/L    HGB 10.7 (L) 13.5 - 17.5 g/dL    HCT 14.7 (L) 82.9 - 53.0 %    MCV 98.9 80.0 - 100.0 fL    MCH 32.6 26.0 - 34.0 pg    MCHC 33.0 31.0 - 37.0 g/dL    RDW 56.2 (H) 13.0 - 15.0 %    MPV 8.2 7.0 - 10.0 fL    Platelet 344 150 - 440 10*9/L   Type and Screen    Collection Time: 12/17/18 10:42 PM   Result Value Ref Range    ABO Grouping A POS     Antibody Screen NEG    Hemoglobin and Hematocrit    Collection Time: 12/18/18  8:45 AM   Result Value Ref Range    HGB 9.0 (L) 13.5 - 17.5 g/dL    HCT 86.5 (L) 78.4 - 53.0 %       Objective:  Temp:  [35.9 ??C (96.6 ??F)-36.9 ??C (98.4 ??F)] 36.9 ??C (98.4 ??F)  Heart Rate:  [62-85] 71  Resp:  [16-20] 18  BP: (95-145)/(49-67) 126/53  SpO2:  [94 %-98 %] 94 %    General: NAD  HEENT: [place]/AT  Neck: Supple  Cardiovascular: RRR, normal S1/S2  Pulmonary: CTAB  Abdomen: Soft, non-tender, non-distended, NABS   Ext: Warm and well-perfused, 1+ right LEE, 1-2+ left LEE  Skin: Warm, dry, intact  Neuro: Alert and oriented to  person / place / time, cranial nerves 2-12 grossly intact, no focal deficits  Psych: Cooperative

## 2018-12-19 LAB — BASIC METABOLIC PANEL
ANION GAP: 4 mmol/L — ABNORMAL LOW (ref 7–15)
ANION GAP: 4 mmol/L — ABNORMAL LOW (ref 7–15)
BLOOD UREA NITROGEN: 12 mg/dL (ref 7–21)
BUN / CREAT RATIO: 14
CALCIUM: 8.1 mg/dL — ABNORMAL LOW (ref 8.5–10.2)
CHLORIDE: 114 mmol/L — ABNORMAL HIGH (ref 98–107)
CO2: 24 mmol/L (ref 22.0–30.0)
EGFR CKD-EPI AA MALE: >90 mL/min/{1.73_m2} (ref >=60)
EGFR CKD-EPI NON-AA MALE: 81 mL/min/{1.73_m2} (ref >=60)
GLUCOSE RANDOM: 104 mg/dL (ref 70–179)
POTASSIUM: 4.1 mmol/L (ref 3.5–5.0)
SODIUM: 142 mmol/L (ref 135–145)

## 2018-12-19 LAB — CBC
HEMATOCRIT: 26.2 % — ABNORMAL LOW (ref 41.0–53.0)
HEMOGLOBIN: 8.6 g/dL — ABNORMAL LOW (ref 13.5–17.5)
MEAN CORPUSCULAR HEMOGLOBIN CONC: 32.7 g/dL (ref 31.0–37.0)
MEAN CORPUSCULAR HEMOGLOBIN: 32.2 pg (ref 26.0–34.0)
MEAN CORPUSCULAR VOLUME: 98.7 fL (ref 80.0–100.0)
RED BLOOD CELL COUNT: 2.66 10*12/L — ABNORMAL LOW (ref 4.50–5.90)
RED CELL DISTRIBUTION WIDTH: 15.6 % — ABNORMAL HIGH (ref 12.0–15.0)
WBC ADJUSTED: 6 10*9/L (ref 4.5–11.0)
WBC ADJUSTED: 6 10*9/L — ABNORMAL LOW (ref 4.5–11.0)

## 2018-12-19 NOTE — Unmapped (Signed)
Pt on clear liquid diet, tolerating well. Continuing with BRBPR, pt compliant with leaving output in toilet for staff to view prior to flushing.  Plan for colonoscopy on Monday.  Pt asking about oral chemo medication, not currently ordered.        Problem: Adult Inpatient Plan of Care  Goal: Plan of Care Review  Outcome: Ongoing - Unchanged  Flowsheets (Taken 12/18/2018 1935)  Progress: no change  Plan of Care Reviewed With: patient     Problem: Adult Inpatient Plan of Care  Goal: Patient-Specific Goal (Individualization)  Outcome: Ongoing - Unchanged  Flowsheets (Taken 12/18/2018 1935)  Patient-Specific Goals (Include Timeframe): rectal bleeding to stop  Individualized Care Needs: IVF, monitor output

## 2018-12-19 NOTE — Unmapped (Signed)
Hospital Medicine Daily Progress Note    Assessment/Plan:  John Watkins is an 83 YO WM with a PMH significant for hypertension, hypothyroidism, and metastatic prostate cancer on a trial drug who presented to the [facility] with a 1-day history of rectal bleeding.  ??  ABLA secondary to LGIB: He presented to the ED with a 1-day history of bloody BMs.  He stated that the initial few BMs appeared maroon, but that they then became bright red.  At presentation, he was afebrile with occasional low blood pressures, but otherwise normal VS.  His physical exam was unremarkable.  His labs showed anemia (H/H of 12.5 / 36.9), but otherwise unremarkable Cbc / CMP / lipase / troponin / INR / lactate / UA.  His CTA A/P showed an acute GI bleed with evidence of active extravasation in the descending colon, extensive colonic diverticulosis without evidence of acute diverticulitis, mild bladder wall thickening and bladder stone unchanged from prior imaging and likely secondary to chronic bladder outlet obstruction secondary to prostatomegaly, and similar appearance of retroperitoneal and pelvic lymphadenopathy.  He was transferred to [facility] for further care.  After admission, he started a GoLytely prep and consumed approximately 2 L by the morning of 12/18/2018. Ongoing BRBPR, transfused 1 unit pRBC when downtrending with ongoing losses overnight into 3/29.   - GI medicine consulted:      ??  - CL diet (no red or purple food stuffs)    - If he becomes hemodynamically unstable with continued bleeding, stat VIR consult for embolization    - d/w GI today, plan colonoscopy tomorrow - will complete the remainder of his prep this afternoon until clear   - Trend H/H Q8h, transfuse to goal hemoglobin >8 (consented for blood on 12/17/2018)   - IVFs   - Monitor pace and character of BMs    ??Metastatic prostate cancer: He is on lupron therapy q3 months and xtandi (on trial). Follows with [facility] oncology (Dr Vernell Barrier).   - Oncology consulted regarding his xtandi - ok to be off for a few days, will request family to bring in if prolonged hospitalization   - Continue flomax 0.4 mg PO daily  ??  HTN: Holding home lotensin and hydrochlorothiazide in the setting of bleed, can restart as indicated  ??  Osteoporosis: This is in the setting of androgen deprivation therapy.   - Continue at home fosamax 70 mg on Mondays  ??  Depression; anxiety:  Continue home lexapro  ??  Hypothyroidism:  Continue home synthroid  ??  Checklist  Antibiotics: None  Steroids: None  Prophylaxis: Ambulatory  Diet: CL  Code Status: DNR/DNI - confirmed with patient and his wife in the ED. Surrogate decision maker would be his wife, John Watkins.  ___________________________________________________________________    Subjective:  John Watkins had ongoing BRB last night requiring transfusion of 1 unit pRBC which he tolerated well. Notes a feeling like gas coming on when he started to bleed that has been bothering him since around the same time he started his new chemotherapy. Patient eager to get stabilized and get home.    Labs/Studies:    All lab results last 24 hours:    Recent Results (from the past 24 hour(s))   Hemoglobin and Hematocrit    Collection Time: 12/18/18  7:44 PM   Result Value Ref Range    HGB 7.9 (L) 13.5 - 17.5 g/dL    HCT 01.0 (L) 27.2 - 53.0 %   Prepare RBC    Collection  Time: 12/19/18 12:41 AM   Result Value Ref Range    Crossmatch Compatible     Unit Blood Type A Pos     ISBT Number 6200     Unit # [id number]     Status Issued     Spec Expiration [id number]     Product ID Red Blood Cells     PRODUCT CODE [id number]    CBC    Collection Time: 12/19/18  6:42 AM   Result Value Ref Range    WBC 6.0 4.5 - 11.0 10*9/L    RBC 2.66 (L) 4.50 - 5.90 10*12/L    HGB 8.6 (L) 13.5 - 17.5 g/dL    HCT 18.8 (L) 41.6 - 53.0 %    MCV 98.7 80.0 - 100.0 fL    MCH 32.2 26.0 - 34.0 pg    MCHC 32.7 31.0 - 37.0 g/dL    RDW 60.6 (H) 30.1 - 15.0 %    MPV 8.3 7.0 - 10.0 fL    Platelet 273 150 - 440 10*9/L Basic Metabolic Panel    Collection Time: 12/19/18  6:42 AM   Result Value Ref Range    Sodium 142 135 - 145 mmol/L    Potassium 4.1 3.5 - 5.0 mmol/L    Chloride 114 (H) 98 - 107 mmol/L    CO2 24.0 22.0 - 30.0 mmol/L    Anion Gap 4 (L) 7 - 15 mmol/L    BUN 12 7 - 21 mg/dL    Creatinine 6.01 0.93 - 1.30 mg/dL    BUN/Creatinine Ratio 14     EGFR CKD-EPI Non-African American, Male 39 >=60 mL/min/1.40m2    EGFR CKD-EPI African American, Male >90 >=60 mL/min/1.24m2    Glucose 104 70 - 179 mg/dL    Calcium 8.1 (L) 8.5 - 10.2 mg/dL   Magnesium Level    Collection Time: 12/19/18  6:42 AM   Result Value Ref Range    Magnesium 1.9 1.6 - 2.2 mg/dL   Hemoglobin and Hematocrit    Collection Time: 12/19/18  1:49 PM   Result Value Ref Range    HGB 9.2 (L) 13.5 - 17.5 g/dL    HCT 23.5 (L) 57.3 - 53.0 %       Objective:  Temp:  [36.3 ??C (97.4 ??F)-36.9 ??C (98.5 ??F)] 36.6 ??C (97.8 ??F)  Heart Rate:  [56-71] 65  Resp:  [18-20] 18  BP: (108-143)/(53-65) 140/55  SpO2:  [95 %-99 %] 95 %    General: NAD  HEENT: [place]/AT  Neck: Supple  Cardiovascular: RRR, normal S1/S2  Pulmonary: CTAB  Abdomen: Soft, non-tender, non-distended, NABS   Ext: Warm and well-perfused, trace bilateral pedal edema, symmetric  Skin: Warm, dry, intact  Neuro: Alert and oriented to person / place / time  Psych: Cooperative

## 2018-12-19 NOTE — Unmapped (Signed)
Patient AAOx4, afebrile, VSS. Pt denies any pain, n/v. Continuing bowel prep with golytely as ordered for colonoscopy tomorrow. Will resume until clear output. Denies any other complaints. WCTM.    Problem: Adult Inpatient Plan of Care  Goal: Plan of Care Review  Outcome: Ongoing - Unchanged  Goal: Patient-Specific Goal (Individualization)  Outcome: Ongoing - Unchanged  Goal: Absence of Hospital-Acquired Illness or Injury  Outcome: Ongoing - Unchanged  Goal: Optimal Comfort and Wellbeing  Outcome: Ongoing - Unchanged  Goal: Readiness for Transition of Care  Outcome: Ongoing - Unchanged  Goal: Rounds/Family Conference  Outcome: Ongoing - Unchanged     Problem: Adjustment to Illness (Gastrointestinal Bleeding)  Goal: Optimal Coping with Acute Illness  Outcome: Ongoing - Unchanged     Problem: Bleeding (Gastrointestinal Bleeding)  Goal: Hemostasis  Outcome: Ongoing - Unchanged     Problem: Hypertension Comorbidity  Goal: Blood Pressure in Desired Range  Outcome: Ongoing - Unchanged

## 2018-12-19 NOTE — Unmapped (Signed)
Patient continues to have frank red, bloody stools. Hgb at 7.9, transfused 1 unit PRBC's overnight , pt. Tolerated well. Remains on IV fluids, afebrile, denies pain. CLuster care at night for sleep. Monitor

## 2018-12-20 ENCOUNTER — Telehealth: Payer: Self-pay | Admitting: Family Medicine

## 2018-12-20 DIAGNOSIS — I10 Essential (primary) hypertension: Principal | ICD-10-CM

## 2018-12-20 DIAGNOSIS — C61 Malignant neoplasm of prostate: Principal | ICD-10-CM

## 2018-12-20 DIAGNOSIS — C772 Secondary and unspecified malignant neoplasm of intra-abdominal lymph nodes: Principal | ICD-10-CM

## 2018-12-20 DIAGNOSIS — D62 Acute posthemorrhagic anemia: Principal | ICD-10-CM

## 2018-12-20 DIAGNOSIS — C775 Secondary and unspecified malignant neoplasm of intrapelvic lymph nodes: Principal | ICD-10-CM

## 2018-12-20 DIAGNOSIS — M810 Age-related osteoporosis without current pathological fracture: Principal | ICD-10-CM

## 2018-12-20 DIAGNOSIS — N210 Calculus in bladder: Principal | ICD-10-CM

## 2018-12-20 DIAGNOSIS — K922 Gastrointestinal hemorrhage, unspecified: Principal | ICD-10-CM

## 2018-12-20 DIAGNOSIS — R338 Other retention of urine: Principal | ICD-10-CM

## 2018-12-20 DIAGNOSIS — N401 Enlarged prostate with lower urinary tract symptoms: Principal | ICD-10-CM

## 2018-12-20 DIAGNOSIS — E039 Hypothyroidism, unspecified: Principal | ICD-10-CM

## 2018-12-20 DIAGNOSIS — N390 Urinary tract infection, site not specified: Principal | ICD-10-CM

## 2018-12-20 DIAGNOSIS — F419 Anxiety disorder, unspecified: Principal | ICD-10-CM

## 2018-12-20 DIAGNOSIS — E038 Other specified hypothyroidism: Principal | ICD-10-CM

## 2018-12-20 DIAGNOSIS — K648 Other hemorrhoids: Principal | ICD-10-CM

## 2018-12-20 DIAGNOSIS — F329 Major depressive disorder, single episode, unspecified: Principal | ICD-10-CM

## 2018-12-20 DIAGNOSIS — Z66 Do not resuscitate: Principal | ICD-10-CM

## 2018-12-20 DIAGNOSIS — R3915 Urgency of urination: Principal | ICD-10-CM

## 2018-12-20 DIAGNOSIS — N320 Bladder-neck obstruction: Principal | ICD-10-CM

## 2018-12-20 DIAGNOSIS — R972 Elevated prostate specific antigen [PSA]: Principal | ICD-10-CM

## 2018-12-20 DIAGNOSIS — Z7982 Long term (current) use of aspirin: Principal | ICD-10-CM

## 2018-12-20 DIAGNOSIS — F17210 Nicotine dependence, cigarettes, uncomplicated: Principal | ICD-10-CM

## 2018-12-20 DIAGNOSIS — R351 Nocturia: Principal | ICD-10-CM

## 2018-12-20 DIAGNOSIS — K5731 Diverticulosis of large intestine without perforation or abscess with bleeding: Principal | ICD-10-CM

## 2018-12-20 DIAGNOSIS — R350 Frequency of micturition: Principal | ICD-10-CM

## 2018-12-20 LAB — CBC
HEMATOCRIT: 26.9 % — ABNORMAL LOW (ref 41.0–53.0)
HEMOGLOBIN: 9 g/dL — ABNORMAL LOW (ref 13.5–17.5)
MEAN CORPUSCULAR HEMOGLOBIN CONC: 33.5 g/dL (ref 31.0–37.0)
MEAN CORPUSCULAR HEMOGLOBIN: 32.9 pg (ref 26.0–34.0)
MEAN CORPUSCULAR HEMOGLOBIN: 32.9 pg — ABNORMAL LOW (ref 26.0–34.0)
MEAN CORPUSCULAR VOLUME: 98.3 fL (ref 80.0–100.0)
PLATELET COUNT: 325 10*9/L (ref 150–440)
RED CELL DISTRIBUTION WIDTH: 16.4 % — ABNORMAL HIGH (ref 12.0–15.0)

## 2018-12-20 LAB — HEMOGLOBIN AND HEMATOCRIT, BLOOD: HEMOGLOBIN: 9.9 g/dL — ABNORMAL LOW (ref 13.5–17.5)

## 2018-12-20 MED ORDER — GENERIC EXTERNAL MEDICATION
Status: DC
Start: ? — End: 2018-12-20

## 2018-12-20 MED ORDER — GENERIC EXTERNAL MEDICATION
Status: DC
Start: 2018-12-21 — End: 2018-12-20

## 2018-12-20 MED ORDER — NICOTINE 14 MG/24HR TD PT24
1.00 | MEDICATED_PATCH | TRANSDERMAL | Status: DC
Start: 2018-12-21 — End: 2018-12-20

## 2018-12-20 MED ORDER — PANTOPRAZOLE SODIUM 40 MG PO TBEC
40.00 | DELAYED_RELEASE_TABLET | ORAL | Status: DC
Start: 2018-12-21 — End: 2018-12-20

## 2018-12-20 MED ORDER — LEVOTHYROXINE SODIUM 50 MCG PO TABS
50.00 | ORAL_TABLET | ORAL | Status: DC
Start: 2018-12-21 — End: 2018-12-20

## 2018-12-20 MED ORDER — ALENDRONATE SODIUM 70 MG PO TABS
70.00 | ORAL_TABLET | ORAL | Status: DC
Start: 2018-12-27 — End: 2018-12-20

## 2018-12-20 MED ORDER — TAMSULOSIN HCL 0.4 MG PO CAPS
.40 | ORAL_CAPSULE | ORAL | Status: DC
Start: 2018-12-21 — End: 2018-12-20

## 2018-12-20 MED ORDER — ACETAMINOPHEN 500 MG PO TABS
500.00 | ORAL_TABLET | ORAL | Status: DC
Start: ? — End: 2018-12-20

## 2018-12-20 MED ORDER — MELATONIN 3 MG PO TABS
3.00 | ORAL_TABLET | ORAL | Status: DC
Start: 2018-12-21 — End: 2018-12-20

## 2018-12-20 MED ORDER — ESCITALOPRAM OXALATE 10 MG PO TABS
10.00 | ORAL_TABLET | ORAL | Status: DC
Start: 2018-12-21 — End: 2018-12-20

## 2018-12-20 MED ORDER — GENERIC EXTERNAL MEDICATION
1.00 | Status: DC
Start: 2018-12-21 — End: 2018-12-20

## 2018-12-20 MED ORDER — LACTATED RINGERS IV SOLN
10.00 | INTRAVENOUS | Status: DC
Start: ? — End: 2018-12-20

## 2018-12-20 NOTE — Telephone Encounter (Signed)
Pt is currently admitted into [facility] for rectal bleeding. Can he stop his once a day Asprin? Please return wife [facility] call

## 2018-12-20 NOTE — Unmapped (Addendum)
GASTROENTEROLOGY TREATMENT PLAN NOTE     Requesting Attending Physician :  Harlow Ohms,*    Reason for Consult:    John Watkins is a 83 y.o. male seen in consultation at the request of Dr. Harlow Ohms,* for [facility].    ASSESSMENT & RECOMMENDATIONS  Mr. Skillin is a 83yo M with PMHs of metastatic prostate cancer on lupron q67mo and xtandi (clinical trial) and on ASA for primary prevention who presented with BRBPR/maroon stool with confirmed source of bleed on CTA in descending colon and extensive diverticulosis c/w diverticular bleed. Colonoscopy was completed today in GI procedures.  Full report will be available in the procedures tab under chart review in Epic.  Briefly, noted to have pan-diverticulosis with evidence of old blood, but no active bleeding, and non-bleeding internal hemorrhoids.  Of note, prep was inadequate to detect polyps <1cm.  ??  Plan:  - maintain active T/S and 2 large bore PIVs  - monitor Hgb q12h and transfuse to goal >7  - can restart regular diet  - can restart ASA, but would recommend further discussion with PCP to determine if patient needs to stay on ASA for primary prevention      Thank you for including [place] in the care of your patient. Patient discussed with Dr. Gayla Medicus.  The GI luminal team will sign off at this time. Please page the GI Luminal consult pager at [phone] with any questions.      Jackelyn Hoehn, MD  Gastroenterology and Hepatology Fellow, PGY-5  [employer], [place]

## 2018-12-20 NOTE — Unmapped (Signed)
Patient NPO for colonoscopy today. Bowel prep with Go-Lytely. Patient continues to have frank red blood, no stool. Last Hgb 8.6. Care clustered at night for sleep. Monitor

## 2018-12-20 NOTE — Unmapped (Signed)
-----   Message from Chrisandra Netters, MD sent at 12/20/2018  1:54 PM EDT -----  Regarding: RE: Patient admitted with GI bleed  Thank you.   Soraya - It is fine for him to miss doses until this is done.   Enid Derry  ----- Message -----  From: Tammy Sours, MD  Sent: 12/18/2018   3:23 PM EDT  To: Elijah Birk, RN, #  Subject: Patient admitted with GI bleed                   Sharia Reeve,    John Watkins, 83 y/o M with CRPC currently on [id number] (ProStar) trial-enzalutamide monotherapy arm, was admitted with acute GI bleed with extravasation visualized in the descending colon on CTA Abdomen which is presumed to be from a diverticulum.  He is being managed conservatively and if GI bleed persists on Monday, they will plan for colonoscopy. He did not bring enzalutamide with him.  We told them it was ok to miss a few days but if his stay is prolonged (by Monday), he should have someone bring it in.    Let me know if you have any other thoughts or concerns.    Nicolasa Ducking, MD  PGY4, Hematology-Oncology fellow  p [phone]

## 2018-12-20 NOTE — Unmapped (Signed)
Inpatient Surgery Update      PRE-OP DOCUMENTATION: PROVIDER CERTIFICATION    I certify that the appropriate documentation of the patient's evaluation, assessment and treatment plan is found within the medical record and that the patientâ€™s condition is unchanged from this earlier assessment.

## 2018-12-20 NOTE — Unmapped (Signed)
Physician Discharge Summary [facility]  4 ONC [facility]  [address]  [place] [address]  Dept: [phone]  Loc: [phone]     Identifying Information:   John Watkins  [birth year]  [id number]    Primary Care Physician: Ruel Favors, MD   Code Status: DNR and DNI    Admit Date: 12/17/2018    Discharge Date: 12/20/2018     Discharge To: Home    Discharge Service: MDL - Hospitalist (Med L)     Discharge Attending Physician: Harlow Ohms, MD    Discharge Diagnoses:  Principal Problem:    Rectal bleeding POA: Unknown  Active Problems:    Enlarged prostate with lower urinary tract symptoms (LUTS) POA: Yes    Malignant neoplasm of prostate (CMS-HCC) POA: Yes    Hypothyroidism POA: Unknown    HTN (hypertension) POA: Unknown    Depression POA: Unknown  Resolved Problems:    * No resolved hospital problems. *    Outpatient Provider Follow Up Issues:   Recheck CBC    Hospital Course:   Mr. Batz is a??83yo M with PMHs of metastatic prostate cancer on lupron q25mo and xtandi (clinical trial) and on ASA for primary prevention who presented with BRBPR/maroon stool with confirmed source of bleed on CTA in descending colon and extensive diverticulosis consistent with diverticular bleed, mild bladder wall thickening and bladder stone unchanged from prior imaging and likely secondary to chronic bladder outlet obstruction secondary to prostatomegaly, and similar appearance of retroperitoneal and pelvic lymphadenopathy. His labs showed anemia (H/H of 12.5 / 36.9), but otherwise unremarkable Cbc / CMP / lipase / troponin / INR / lactate / UA.  He required transfusion of 1 unit of blood overnight into 3/29 and his hemoglobin was stable at ~9 for >24 hrs prior to discharge with symptom resolution.  Colonoscopy was 3/30 which showed pan-diverticulosis with evidence of old blood, but no active bleeding, and non-bleeding internal hemorrhoids, no biopsies obtained.  Of note, prep was inadequate to detect polyps <1cm. His post-colonoscopy hemoglobin remained stable and he was discharged in good condition.     ??Metastatic prostate cancer: He is??on lupron therapy q3 months and xtandi (on trial). Follows with [facility] oncology (Dr Vernell Barrier). Oncology consulted regarding his xtandi - ok to be off for a few days, will resume on discharge.  Continue flomax 0.4 mg PO daily  ??  HTN: Held home??lotensin and??hydrochlorothiazide in the setting of bleed, can restart on discharge.  ??  Osteoporosis: This is in the setting of androgen deprivation therapy. Continue at home fosamax 70 mg??on Mondays  ??  Depression; anxiety:  Continue home lexapro  ??  Hypothyroidism:  Continue home synthroid  ??  Procedures: Colonoscopy 12/20/18  _____________________________________________________________________  Discharge Medications:     Your Medication List      ASK your doctor about these medications    alendronate 70 MG tablet  Commonly known as:  FOSAMAX  Take 1 tablet (70 mg total) by mouth every seven (7) days.     ascorbic acid (vitamin C) 250 MG tablet  Commonly known as:  VITAMIN C  Take 250 mg by mouth.     calcium carbonate 600 mg calcium (1,500 mg) tablet  Commonly known as:  OS-CAL  Take 1 tablet by mouth daily.     escitalopram oxalate 10 MG tablet  Commonly known as:  LEXAPRO  Take 1 tablet (10 mg total) by mouth daily.     hydroCHLOROthiazide 12.5 MG tablet  Commonly known  as:  HYDRODIURIL  Take 6.25 mg by mouth daily.     leuprolide 22.5 mg injection  Commonly known as:  LUPRON  Inject 22.5 mg into the muscle Every three (3) months.     levothyroxine 50 MCG tablet  Commonly known as:  SYNTHROID  Take 1 tablet (50 mcg total) by mouth daily.     multivitamin per tablet  Commonly known as:  TAB-A-VITE/THERAGRAN  Take 1 tablet by mouth daily.     tamsulosin 0.4 mg capsule  Commonly known as:  FLOMAX  Take 1 capsule (0.4 mg total) by mouth daily.     XTANDI 40 mg capsule  Generic drug:  enzalutamide  Take 4 capsules (160 mg total) by mouth daily. Allergies:  Valium [diazepam]  ______________________________________________________________________  Pending Test Results (if blank, then none):      Most Recent Labs:  Lab Results   Component Value Date    WBC 8.1 12/20/2018    HGB 9.0 (L) 12/20/2018    HCT 26.9 (L) 12/20/2018    PLT 325 12/20/2018       Lab Results   Component Value Date    NA 142 12/19/2018    K 4.1 12/19/2018    CL 114 (H) 12/19/2018    CO2 24.0 12/19/2018    BUN 12 12/19/2018    CREATININE 0.87 12/19/2018    GLU 104 12/19/2018    CALCIUM 8.1 (L) 12/19/2018    MG 1.9 12/19/2018    PHOS 4.1 12/09/2018       Lab Results   Component Value Date    BILITOT 0.4 12/17/2018    BILIDIR <0.10 07/15/2018    PROT 6.9 12/17/2018    ALBUMIN 3.8 12/17/2018    ALT 13 12/17/2018    AST 18 (L) 12/17/2018    ALKPHOS 89 12/17/2018       Lab Results   Component Value Date    INR 1.12 12/17/2018    APTT 35.2 11/11/2018       Relevant Studies/Radiology (if blank, then none):  Cta Abdomen Pelvis W Wo Contrast    Result Date: 12/17/2018  EXAM: CTA abdomen and pelvis DATE: 12/17/2018 3:43 PM ACCESSION: [id number] UN DICTATED: 12/17/2018 3:50 PM INTERPRETATION LOCATION: [facility] CLINICAL INDICATION: 83 years old Male with Hx of metastatic prostate cancer with new rectal bleed. ; GI bleed  COMPARISON: 12/08/2018 TECHNIQUE: A spiral CTA scan was obtained with IV contrast from the lung bases to the pubic symphysis.  Multiplanar reformatted and MIP images were provided for further evaluation of the vessels. For selected cases, 3D volume rendered images are also provided. VASCULAR FINDINGS: Aortic and branch vessel atherosclerosis. Celiac trunk: Patent SMA: Patent Renal arteries: Single bilateral renal arteries are patent. IMA: Patent Right common iliac artery: Patent with moderate atherosclerosis Right external iliac artery: Patent with moderate atherosclerosis Right internal iliac artery: Patent with moderate atherosclerosis Right common femoral artery, SFA and profunda femoral artery: Patent Left internal iliac artery: Patent with moderate atherosclerosis Left external iliac artery: Patent with moderate atherosclerosis Left internal iliac artery: Patent with moderate atherosclerosis Left common femoral artery, SFA and profunda femoral artery: Patent NONVASCULAR FINDINGS: LINES AND TUBES: None. LOWER THORAX: Subsegmental atelectasis in the lung bases. HEPATOBILIARY: No focal hepatic lesions. The gallbladder is present and otherwise unremarkable. No biliary dilatation.  SPLEEN: Lobulated contour, unchanged. PANCREAS: Unremarkable. ADRENALS: 1.2 cm left adrenal adenoma. The right adrenal gland is unremarkable. KIDNEYS/URETERS: Unchanged right renal cyst and bilateral subcentimeter hypodensities which are too small to characterize.  Symmetric nephrograms. No hydronephrosis. BLADDER: Mild bladder wall thickening. 1.1 cm stone in the left posterior bladder, unchanged PELVIC/REPRODUCTIVE ORGANS: Prostatomegaly. Brachytherapy seeds in the prostate. GI TRACT: In the descending colon there is a region of contrast extravasation with persistent contrast blush on delayed images (5:67, 9:31). Extensive colonic diverticulosis. The appendix is unremarkable. PERITONEUM/RETROPERITONEUM AND MESENTERY: No free air or fluid. LYMPH NODES: 3.7 x 2.5 cm left pelvic sidewall lymphadenopathy (5:144), similar to prior. Multiple additional enlarged retroperitoneal lymph nodes, for reference left periaortic lymph node measuring 1.4 cm (5:100), unchanged. VESSELS: The portal venous system is patent. The hepatic veins and IVC are unremarkable. BONES AND SOFT TISSUES: Multilevel degenerative change of the spine. Unchanged lipoma in the left gluteus medius. Tiny fat-containing umbilical hernia.     Acute GI bleed with evidence of active extravasation in the descending colon. Extensive colonic diverticulosis without evidence of acute diverticulitis. Mild bladder wall thickening and bladder stone are unchanged from prior and likely secondary to chronic bladder outlet obstruction secondary to prostatomegaly. Similar appearance of retroperitoneal and pelvic lymphadenopathy. The findings of this study were discussed via telephone with DR. Joelene Millin FENDER by Dr. Waynard Edwards on 12/17/2018 4:10 PM.    ______________________________________________________________________  Discharge Instructions:                   Appointments which have been scheduled for you    Jan 06, 2019 11:00 AM EDT  (Arrive by 10:30 AM)  LAB ONLY [place] with ADULT ONC LAB  [facility] ADULT ONCOLOGY LAB DRAW STATION [facility] REGION) [address]  [place] [address]  [phone]      Jan 06, 2019 12:00 PM EDT  (Arrive by 11:30 AM)  RETURN ACTIVE [place] with Chrisandra Netters, MD  [facility] ONCOLOGY MULTIDISCIPLINARY 2ND FLR CANCER HOSP [facility] REGION) [address]  [place] [address]  [phone]      Feb 02, 2019 11:00 AM EDT  (Arrive by 10:30 AM)  NM BONE SCAN WHOLE BODY APPT 1 with [facility] NM INJ RM 1  IMG NUCLEAR MEDICINE [facility]) [address]  [place] [address]  [phone]      Feb 02, 2019  2:00 PM EDT  (Arrive by 1:30 PM)  NM BONE SCAN WHOLE BODY APPT 2 with [facility] NM RM 3  IMG NUCLEAR MEDICINE [facility]) [address]  [place] [address]  [phone]      Feb 02, 2019  3:00 PM EDT  (Arrive by 2:30 PM)  CT CHEST ABDOMEN PELVIS W CONTRAST with [facility] CT RM 3  IMG CT [facility]) [address]  [place] [address]  [phone]   On appt date:  Drink lots of water 24 hrs  Bring recent lab work  Take meds as usual  [employer] fast streamer of current meds  Bring snack if diabetic    On appt date do not:  Consume anything 2 hrs prior to your appointment    Let us know if pt:  Allergic to contrast dyes  Diabetic  Pregnant or nursing  Claustrophobic    (Title:[id number])     Feb 03, 2019 10:00 AM EDT  (Arrive by 9:30 AM)  LAB ONLY [place] with ADULT ONC LAB  [facility] ADULT ONCOLOGY LAB DRAW STATION [facility] REGION) [address]  [place] [address]  [phone]      Feb 03, 2019 11:00  AM EDT  (Arrive by 10:30 AM)  RETURN ACTIVE [place] with Chancy Hurter, ANP  [place] ONCOLOGY MULTIDISCIPLINARY 2ND FLR CANCER HOSP [facility] REGION) [address]  [place] [address]  [phone]           ______________________________________________________________________  Discharge Day Services:  BP 145/58  - Pulse 90  - Temp 36.9 ??C (98.4 ??F) (Oral)  - Resp 16  - Ht 172.7 cm (5' 8)  - Wt 81.2 kg (179 lb)  - SpO2 98%  - BMI 27.22 kg/m??   Pt seen on the day of discharge and determined appropriate for discharge.    Condition at Discharge: good    Length of Discharge: I spent greater than 30 mins in the discharge of this patient.

## 2018-12-21 DIAGNOSIS — N390 Urinary tract infection, site not specified: Principal | ICD-10-CM

## 2018-12-21 DIAGNOSIS — R338 Other retention of urine: Principal | ICD-10-CM

## 2018-12-21 DIAGNOSIS — N401 Enlarged prostate with lower urinary tract symptoms: Principal | ICD-10-CM

## 2018-12-21 DIAGNOSIS — R351 Nocturia: Principal | ICD-10-CM

## 2018-12-21 DIAGNOSIS — E039 Hypothyroidism, unspecified: Principal | ICD-10-CM

## 2018-12-21 DIAGNOSIS — I10 Essential (primary) hypertension: Principal | ICD-10-CM

## 2018-12-21 DIAGNOSIS — R350 Frequency of micturition: Principal | ICD-10-CM

## 2018-12-21 DIAGNOSIS — R3915 Urgency of urination: Principal | ICD-10-CM

## 2018-12-21 DIAGNOSIS — C61 Malignant neoplasm of prostate: Principal | ICD-10-CM

## 2018-12-21 DIAGNOSIS — R972 Elevated prostate specific antigen [PSA]: Principal | ICD-10-CM

## 2018-12-21 NOTE — Telephone Encounter (Signed)
Called patient to inform him to stop aspirin. No answer, no vm. If patient or wife calls back please tell them to stop aspirin.

## 2018-12-21 NOTE — Unmapped (Signed)
Pt H/H stable. Ready for d/c. Wife picked up pt. At womens lobby.      Problem: Adult Inpatient Plan of Care  Goal: Plan of Care Review  Outcome: Resolved  Goal: Patient-Specific Goal (Individualization)  Outcome: Resolved  Goal: Absence of Hospital-Acquired Illness or Injury  Outcome: Resolved  Goal: Optimal Comfort and Wellbeing  Outcome: Resolved  Goal: Readiness for Transition of Care  Outcome: Resolved  Goal: Rounds/Family Conference  Outcome: Resolved

## 2018-12-22 ENCOUNTER — Telehealth: Payer: Self-pay

## 2018-12-22 MED ORDER — GENERIC EXTERNAL MEDICATION
Status: DC
Start: ? — End: 2018-12-22

## 2018-12-22 NOTE — Telephone Encounter (Signed)
Attempted to reach patient for TCM call regarding recent hospital stay. Unable to leave message. Will call again tomorrow.

## 2018-12-22 NOTE — Telephone Encounter (Signed)
Patient called again. Patient aware.

## 2018-12-27 NOTE — Unmapped (Signed)
Called and left message to return call regarding how John Watkins was doing and if he had restarted his study medications.

## 2018-12-30 NOTE — Unmapped (Signed)
Left message for cb regarding restart of study drug.

## 2019-01-04 NOTE — Unmapped (Signed)
[facility] Refill Coordination Note    Specialty Medication(s) to be Shipped:   Hematology/Oncology: John Watkins 40 mg       John Watkins, DOB: 08-06-1936  Phone: [phone] (home)       All above HIPAA information was verified with patient.     Completed refill call assessment today to schedule patient's medication shipment from the [facility] [phone]).       Specialty medication(s) and dose(s) confirmed: Regimen is correct and unchanged.   Changes to medications: John Watkins reports no changes at this time.  Changes to insurance: No  Questions for the pharmacist: No    Confirmed patient received Welcome Packet with first shipment. The patient will receive a drug information handout for each medication shipped and additional FDA Medication Guides as required.       DISEASE/MEDICATION-SPECIFIC INFORMATION        N/A    SPECIALTY MEDICATION ADHERENCE     Medication Adherence    Adherence tools used:  medication list   Other adherence tool:  routine   Support network for adherence:  family member                Xtandi 40 mg  : 8 days of medicine on hand        SHIPPING     Shipping address confirmed in Epic.     Delivery Scheduled: Yes, Expected medication delivery date: 01/10/2019.     Medication will be delivered via Next Day Courier to the home address in Epic [place].    John Watkins   [facility] Specialty Technician

## 2019-01-05 ENCOUNTER — Encounter: Admit: 2019-01-05 | Discharge: 2019-01-05 | Payer: MEDICARE | Attending: Medical Oncology | Primary: Medical Oncology

## 2019-01-05 ENCOUNTER — Ambulatory Visit: Admit: 2019-01-05 | Discharge: 2019-01-05 | Payer: MEDICARE

## 2019-01-05 ENCOUNTER — Telehealth: Payer: Self-pay | Admitting: Family Medicine

## 2019-01-05 DIAGNOSIS — C61 Malignant neoplasm of prostate: Secondary | ICD-10-CM

## 2019-01-05 DIAGNOSIS — R799 Abnormal finding of blood chemistry, unspecified: Principal | ICD-10-CM

## 2019-01-05 DIAGNOSIS — C775 Secondary and unspecified malignant neoplasm of intrapelvic lymph nodes: Secondary | ICD-10-CM

## 2019-01-05 DIAGNOSIS — R946 Abnormal results of thyroid function studies: Secondary | ICD-10-CM

## 2019-01-05 DIAGNOSIS — R791 Abnormal coagulation profile: Secondary | ICD-10-CM

## 2019-01-05 DIAGNOSIS — I10 Essential (primary) hypertension: Secondary | ICD-10-CM

## 2019-01-05 DIAGNOSIS — R600 Localized edema: Secondary | ICD-10-CM

## 2019-01-05 LAB — LIPID PANEL
CHOLESTEROL: 187 mg/dL (ref 100–199)
HDL CHOLESTEROL: 47 mg/dL (ref 40–59)
LDL CHOLESTEROL CALCULATED: 107 mg/dL
LDL CHOLESTEROL CALCULATED: 107 mg/dL — ABNORMAL HIGH (ref 1–149)
NON-HDL CHOLESTEROL: 140 mg/dL
TRIGLYCERIDES: 165 mg/dL — ABNORMAL HIGH (ref 1–149)

## 2019-01-05 LAB — CBC W/ AUTO DIFF
BASOPHILS ABSOLUTE COUNT: 0.1 10*9/L (ref 0.0–0.1)
BASOPHILS RELATIVE PERCENT: 0.8 %
EOSINOPHILS ABSOLUTE COUNT: 0.3 10*9/L (ref 0.0–0.4)
EOSINOPHILS RELATIVE PERCENT: 4.8 %
HEMATOCRIT: 34.1 % — ABNORMAL LOW (ref 41.0–53.0)
HEMOGLOBIN: 11.2 g/dL — ABNORMAL LOW (ref 13.5–17.5)
LARGE UNSTAINED CELLS: 3 % (ref 0–4)
LYMPHOCYTES ABSOLUTE COUNT: 2.4 10*9/L (ref 1.5–5.0)
LYMPHOCYTES RELATIVE PERCENT: 35.2 %
MEAN CORPUSCULAR HEMOGLOBIN CONC: 32.7 g/dL (ref 31.0–37.0)
MEAN CORPUSCULAR HEMOGLOBIN: 33.1 pg (ref 26.0–34.0)
MEAN CORPUSCULAR VOLUME: 101.2 fL — ABNORMAL HIGH (ref 80.0–100.0)
MONOCYTES ABSOLUTE COUNT: 0.4 10*9/L (ref 0.2–0.8)
MONOCYTES RELATIVE PERCENT: 6.3 %
NEUTROPHILS ABSOLUTE COUNT: 3.4 10*9/L (ref 2.0–7.5)
NEUTROPHILS RELATIVE PERCENT: 49.9 %
PLATELET COUNT: 417 10*9/L (ref 150–440)
RED BLOOD CELL COUNT: 3.37 10*12/L — ABNORMAL LOW (ref 4.50–5.90)
WBC ADJUSTED: 6.7 10*9/L (ref 4.5–11.0)

## 2019-01-05 LAB — CREATININE
CREATININE: 0.9 mg/dL (ref 0.70–1.30)
EGFR CKD-EPI AA MALE: >90 mL/min/{1.73_m2} (ref >=60)

## 2019-01-05 LAB — ALBUMIN: ALBUMIN: 3.9 g/dL (ref 3.5–5.0)

## 2019-01-05 NOTE — Telephone Encounter (Signed)
HCTZ is refilled; needs routine follow up with Dr. Ancil Boozer - please call to schedule. Thanks!

## 2019-01-05 NOTE — Telephone Encounter (Signed)
Pt already had his appt scheduled. Pt informed that prescription has been sent to the pharmacy. He is asking that this visit be a telephone visit due to him not having a smart phone and really do not know  How to use the compter to download webex.

## 2019-01-05 NOTE — Unmapped (Signed)
COVID-19 Screening Questions:  - Have you traveled outside of the state in the last two weeks? No  - Have you come in close contact with any person(s) with confirmed COVID-19? No  - In the last two weeks have you had a fever greater than 100.96F? No  - Do you have any new or worsening respiratory symptoms as far as: cough, SOB, sore throat or upper respiratory nasal congestion? No  - Do you have any new or worsening loss of smell or taste, not related to chemo? No      Pt acknowledges visit type: phone visit only     Advised of visitor restrictions: Patients are  encouraged not to bring visitors with them to their appointment unless needed for safety reasons. Advised that they will be given a mask and screened upon arrival.

## 2019-01-06 ENCOUNTER — Ambulatory Visit: Admit: 2019-01-06 | Discharge: 2019-01-06 | Payer: MEDICARE

## 2019-01-06 ENCOUNTER — Institutional Professional Consult (permissible substitution): Admit: 2019-01-06 | Discharge: 2019-01-06 | Payer: MEDICARE | Attending: Medical Oncology | Primary: Medical Oncology

## 2019-01-06 MED ORDER — LEVOTHYROXINE 75 MCG TABLET
ORAL_TABLET | Freq: Every day | ORAL | 7 refills | 0 days | Status: CP
Start: 2019-01-06 — End: 2019-04-28

## 2019-01-06 NOTE — Telephone Encounter (Signed)
Telephone visit is perfectly fine. Thanks

## 2019-01-06 NOTE — Unmapped (Addendum)
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 He has been asymptomatic.  He has had RT to the prostate in the past.  He is on clinical trial CPI-1201-201 (ProStar) on the enzalutamide monotherapy arm.  He was admitted to the hospital recently with a GI bleed from diverticulum which is (likely) unrelated to study treatment.  He is feeling well now.    No AEs attributable to study drug.    Plan:  - Switch to Eligard 45mg  (6 month)n to minimize risk of exposure to health care system during coronavirus crisis (last Lupron 10/14/18). Aim to receive with planned scans.  - Continue CPI-1201-201 (ProStar) trial, enzalutamide monotherapy arm.  - Study scans are due, will schedule, but goal is to minimize exposure to health system.  - Hypothyroid: Increase Synthroid dose to .    - I previously asked Radiation Oncology to weigh in about potential RT to the lymph nodes, and they felt this would not be beneficial and would confer toxicity without likely long-term disease control.    - Continue BP meds started w abiraterone.  Continue blood pressure control with his PCP.  - Clinical genetics saw him, negative genomic screening.  - Nocturia: persistent, does not want Flomax.   - Anxiety: On Lexapro - Pharmacy ran interactions for both olaparib and abiraterone.  - Smoking: He understands risks, and the [place] smoking cessation program here has been meeting with him for counseling.  - Bone density: Checked again in 6/19 and although only mildly low, had 10% loss in bone density from 2016-2019 on Lupron and now prednisone.  On Fosamax, calcium/D.    He is DNR/DNI.  His proxy is his wife.    Follow up per trial. Patent consented to virtual encounters.  I spent 45 minutes with the patient and coordinating care.    -------------------------------------------------------    Referring physician:  Riki Altes, MD [facility])  Other physician:  Irven Easterly, MD    CC: Prostate cancer to lymph nodes and possibly bone s/p IMRT to prostate, with elevated PSA, on Lupron    Current therapy: Lupron    HPI:  2010: IMRT to prostate for T1c prostate adenocarcinoma, biopsy with up to Gleason 4+3=7.  Subsequent PSA rise with negative saturation biopsy on 11/17/12.  - 04/05/13: 8.3  - 08/29/13: 12.8  - 02/09/14: 13.3  10/2013: MRI with enlarged left obturator, iliac and para-aortic nodes up to 3 cm consistent with metastatic disease. Bilateral ischia and L pelvis with bony areas <75mm of unclear etiology (bone islands vs. metastases).  10/2013: Lupron started  Subsequent PSA:  - 05/01/14: 1.4  - 08/02/14: 1.4  - 11/03/14: 1.7  01/18/16: Bone scan: no metastases.  01/18/16: CT A/P: 2.6 cm left external iliac chain hyperenhancing node (series 6, image 57). Several prominent subcentimeter lymph nodes are seen along the left para-aortic retroperitoneum.  Prostate is enlarged measuring 6.0 x 6.7 x 9.3 cm. [Possible lipoma in gluteus maximus, or could be related to Lupron injection, discussed w urology, no action warranted.]  10/16/16: restaging:   - CT CAP: Interval increase in size of left external and common iliac chain as well as periaortic lymph nodes, worrisome for metastatic disease.   - Bone Scan: No evidence of osseous metastatic disease.  5/18: Abiraterone/prednisone  6/19: BMD (c/w 2016): Lumbar spine: ??Normal bone density. ??The measurement has decreased significantly since prior study.  Left proximal femur: Mildly low bone density. ??The measurement has decreased significantly since prior study.  04/15/18: PSA 10.6  08/04/18: PSA rising on abiraterone, now 16.7  08/04/18: Bone scan: No evidence of osseous metastasis.  08/04/18: CT CAP: Interval increase in external iliac, periaortic, and aortocaval lymphadenopathy concerning for disease progression.  1/20: Start enzalutamide on trial.      ROS: Nocturia stable at every 2-3 hours but drinks tea before bed, doesn't bother him.  No pain.  Fatigue.  Good appetite.  Has tolerated Lupron well.  Remainder of 10 system ROS negative.    PE:  ECOG 0  There were no vitals taken for this visit.      Current Outpatient Medications   Medication Sig Dispense Refill   ??? alendronate (FOSAMAX) 70 MG tablet Take 1 tablet (70 mg total) by mouth every seven (7) days. (Patient taking differently: Take 70 mg by mouth Every Monday. ) 12.85 tablet 3   ??? ascorbic acid, vitamin C, (VITAMIN C) 250 MG tablet Take 250 mg by mouth.     ??? benazepriL (LOTENSIN) 20 MG tablet Take 20 mg by mouth daily. Pt said he was advised to split pills and take 10 mg per day     ??? calcium carbonate (OS-CAL) 600 mg calcium (1,500 mg) tablet Take 1 tablet by mouth daily.     ??? enzalutamide (XTANDI) 40 mg capsule Take 4 capsules (160 mg total) by mouth daily. 120 capsule 6   ??? escitalopram oxalate (LEXAPRO) 10 MG tablet Take 1 tablet (10 mg total) by mouth daily. 90 tablet 1   ??? hydroCHLOROthiazide (HYDRODIURIL) 12.5 MG tablet Take 6.25 mg by mouth daily.     ??? leuprolide (LUPRON) 22.5 mg injection Inject 22.5 mg into the muscle Every three (3) months.     ??? levothyroxine (SYNTHROID) 50 MCG tablet Take 1 tablet (50 mcg total) by mouth daily. 30 tablet 11   ??? multivitamin (THERAGRAN) per tablet Take 1 tablet by mouth daily.     ??? tamsulosin (FLOMAX) 0.4 mg capsule Take 1 capsule (0.4 mg total) by mouth daily. 90 capsule 3     No current facility-administered medications for this visit.      Allergies   Allergen Reactions   ??? Valium [Diazepam] Other (See Comments)     Hallucinations, altered mental status  Hallucinations, altered mental status           Appointment on 01/05/2019   Component Date Value Ref Range Status   ??? TSH 01/05/2019 48.600* 0.600 - 3.300 uIU/mL Final   ??? Free T4 01/05/2019 0.76  0.71 - 1.40 ng/dL Final   Research Encounter on 01/05/2019   Component Date Value Ref Range Status   ??? Sodium 01/05/2019 142  135 - 145 mmol/L Final   ??? Potassium 01/05/2019 4.4  3.5 - 5.0 mmol/L Final   ??? Chloride 01/05/2019 106  98 - 107 mmol/L Final   ??? CO2 01/05/2019 25.0  22.0 - 30.0 mmol/L Final   ??? BUN 01/05/2019 12  7 - 21 mg/dL Final   ??? Creatinine 01/05/2019 0.90  0.70 - 1.30 mg/dL Final   ??? EGFR CKD-EPI Non-African American,* 01/05/2019 80  >=60 mL/min/1.70m2 Final   ??? EGFR CKD-EPI African American, Male 01/05/2019 >90  >=60 mL/min/1.29m2 Final   ??? Glucose 01/05/2019 117  65 - 179 mg/dL Final   ??? Calcium 76/28/3151 9.6  8.5 - 10.2 mg/dL Final   ??? Magnesium 76/16/0737 1.9  1.6 - 2.2 mg/dL Final   ???  Phosphorus 01/05/2019 4.0  2.9 - 4.7 mg/dL Final   ??? Albumin 60/45/4098 3.9  3.5 - 5.0 g/dL Final   ??? Total Bilirubin 01/05/2019 0.4  0.0 - 1.2 mg/dL Final   ??? ALT 11/91/4782 12  <50 U/L Final   ??? AST 01/05/2019 19  19 - 55 U/L Final   ??? Alkaline Phosphatase 01/05/2019 72  38 - 126 U/L Final   ??? LDH 01/05/2019 425  338 - 610 U/L Final   ??? Uric Acid 01/05/2019 3.8* 4.0 - 9.0 mg/dL Final   ??? Creatine Kinase, Total 01/05/2019 46.0  45.0 - 250.0 U/L Final   ??? Triglycerides 01/05/2019 165* 1 - 149 mg/dL Final   ??? Cholesterol 01/05/2019 187  100 - 199 mg/dL Final   ??? HDL 95/62/1308 47  40 - 59 mg/dL Final   ??? LDL Calculated 01/05/2019 657  mg/dL Final   ??? VLDL Cholesterol Cal 01/05/2019 33  mg/dL Final   ??? Chol/HDL Ratio 01/05/2019 4.0   Final   ??? Non-HDL Cholesterol 01/05/2019 140  mg/dL Final   ??? FASTING 84/69/6295 Yes   Final   ??? PSA 01/05/2019 6.15* 0.00 - 4.00 ng/mL Final   ??? Estradiol 01/05/2019 20.5  pg/mL Final    Reference Ranges:  Serum Estrogen  (pg/ml)   Male: 5-66   Male (Postmenopausal): 5-38   Male (Ovulating):  (pg/ml)          follicular phase 27-161          periovulatory 187-382          luteal phase 33-201    ??? FSH 01/05/2019 3.7  mIU/mL Final   ??? LH 01/05/2019 <0.5  mIU/mL Final   ??? Prolactin 01/05/2019 9.5  4.0 - 18.0 ng/mL Final   ??? WBC 01/05/2019 6.7  4.5 - 11.0 10*9/L Final   ??? RBC 01/05/2019 3.37* 4.50 - 5.90 10*12/L Final   ??? HGB 01/05/2019 11.2* 13.5 - 17.5 g/dL Final   ??? HCT 28/41/3244 34.1* 41.0 - 53.0 % Final   ??? MCV 01/05/2019 101.2* 80.0 - 100.0 fL Final   ??? MCH 01/05/2019 33.1  26.0 - 34.0 pg Final   ??? MCHC 01/05/2019 32.7  31.0 - 37.0 g/dL Final   ??? RDW 09/24/7251 17.3* 12.0 - 15.0 % Final   ??? MPV 01/05/2019 7.0  7.0 - 10.0 fL Final   ??? Platelet 01/05/2019 417  150 - 440 10*9/L Final   ??? Neutrophils % 01/05/2019 49.9  % Final   ??? Lymphocytes % 01/05/2019 35.2  % Final   ??? Monocytes % 01/05/2019 6.3  % Final   ??? Eosinophils % 01/05/2019 4.8  % Final   ??? Basophils % 01/05/2019 0.8  % Final   ??? Absolute Neutrophils 01/05/2019 3.4  2.0 - 7.5 10*9/L Final   ??? Absolute Lymphocytes 01/05/2019 2.4  1.5 - 5.0 10*9/L Final   ??? Absolute Monocytes 01/05/2019 0.4  0.2 - 0.8 10*9/L Final   ??? Absolute Eosinophils 01/05/2019 0.3  0.0 - 0.4 10*9/L Final   ??? Absolute Basophils 01/05/2019 0.1  0.0 - 0.1 10*9/L Final   ??? Large Unstained Cells 01/05/2019 3  0 - 4 % Final   ??? Macrocytosis 01/05/2019 Moderate* Not Present Final   ??? Anisocytosis 01/05/2019 Slight* Not Present Final       Lab Results   Component Value Date    PSA 6.15 (H) 01/05/2019    PSA 6.53 (H) 12/09/2018    PSA 6.63 (H) 11/11/2018  PSA 20.10 (H) 10/14/2018    PSA 19.80 (H) 09/29/2018    PSA 15.50 (H) 08/12/2018

## 2019-01-06 NOTE — Unmapped (Signed)
Nice speaking today.   If you have any questions please call the Nurse Navigator in the office at [phone], or [phone]) [phone].  Frederic Jericho, MD

## 2019-01-06 NOTE — Unmapped (Signed)
Necessary alterations to study procedures being made to minimize their risk of exposure to COVID-19. The patient was informed of all changes to study procedures, including reasonable alternatives. The patient will continue to be notified of new information as it becomes available.       Clinical Study: CPI-1205-201: A Phase 1b/2 Study of CPI-1205, a Small Molecule Inhibitor of EZH2, Combined with Enzalutamide or Abiraterone/Prednisone in Patients with Metastatic Castration Resistant Prostate Cancer     Study Treatment: Randomized Arm Phase 2 (Enzalutamide ??? Control Arm)     Date of Service: 16APR2020     Cycle / Day: C4D1     Performance Status: ECOG PS: 0     Narrative: Patient had phone visit with Dr. Frederic Jericho on 16APR2020 for C4D1 of CPI-1205 protocol therapy. No physical exam was performed due to COIVD-19 precautions. I spoke with patient on the phone today and reviewed conmeds and A/E's. Labs drawn yesterday in [place], currently Gr 1 Hypothyroidism, will continue to monitor. Dr. Vernell Barrier increased levothyroid to qd. Will hold Lupron for now to reduce exposure. Will also delay scans scheduled in 4 weeks, for reduced exposure. Complaints of mild to moderate fatigue, not interfering with ADL???s. Patient randomized to control arm on 21JAN2020.      Plan: Patient will be contacted in 4 weeks for C5D1 on 14MAY2020. Scans scheduled for 13MAY2020. Will be delayed Patient and wife extensively educated on side effects, dosing schedule, medication diary and both verbalized understanding. Contact information for study coordinator and Dr. Vernell Barrier provided and patient agrees to contact office with any questions or concerns.       Enzalutamide Med Compliance Log  Cycle  # Pills Dispensed  # Pills  Returned  # Pills  Dosed # Pills in Planned Dose % Dose Taken  Comments   Cycle 1 120 12 108 108 100    Cycle 2 120 12 108 108 100    Cycle 3         Cycle 4         Cycle 5         Cycle 6           Adverse Events Log: Diagnosis Start Date   Stop Date Grade   Attribution  1: Unrelated 2: Unlikely 3: Possible 4: Probable 5: Definite 6: Unknown CS/NCS       Enzalutamide Prednisone    Hypothyroidism 19MAR2020 Ongoing 1      Fatigue 11MAR2020 Ongoing 1      Diverticulitis 27MAR2020 30MAR2020 3                                     Concomitant Medication Log: New medication highlighted in yellow.      Medication Dose/  Route Start Date Stop Date Indication Related to AE?  (yes/no, specify if yes)   Tamsulosin   0.4mg  PO QD 10JAN2020 Ongoing  Nocturia  Med Hx   Alendronate   70mg  PO Q Week 25JUL2019 Ongoing Health Maintenance Med Hx   Aspirin 81mg  PO  QD 30JAN2014 Ongoing Cardiac Prophylaxis Med Hx   Acetaminophen 500mg  PO Q6o PRN [id number] Ongoing Aches/Pains Med Hx   Amlodipine-Benazepril 5/20mg  PO QD 09MAR2018 16JAN2020 Hypertension Med Hx   Escitalopram Oxalate 10mg  PO BID 01FEB2019 Ongoing  Anxiety/Depression Med Hx   Vitamin C   250mg  PO  3 tabs Q8o 01MAR2018 Ongoing General Health Med Hx  Calcium Carbonate 600mg  PO QD 24NOV2019 Ongoing Health Maintenance Med Hx   Prednisone 10mg  PO QD [id number] 07/2018 Prostate CA Med Hx   Abiraterone 250mg  PO 4 tab QD [id number] 07/2018 Prostate CA Med Hx   Multivitamin 1 Tab PO   QD 04FEB2015 Ongoing General Health Med Hx   Leuprolide 45mg  IM 04FEB2015 Ongoing Prostate CA Med Hx   Benazepril HCL 20mg  PO QD 17JAN2020 Ongoing Hypertension Med Hx   Levothyroxine PO QD 19MAR2020 16APR2020 Hypothyroidism A/E   Levothyroxine PO QD 17APR2020 Ongoing Hypothyroidism AE      Medical History  Diagnosis Start Date   Stop Date Grade   Clinically significant (yes/no)   Inguinal Hernia [id number] 2 No   Allergic Rhinitis 1995 1995 2 No   Bilateral Lower Extremity Edema 16DEC2019 Ongoing 1 No   Hypertension 03MAR2013 Ongoing 2 No   Nocutria [id number] Ongoing 1 No   Fatigue unAPR2013 Ongoing 1 No   Urinary Urgency [id number] Ongoing 1 No   Urinary Retention unAPR2013 Ongoing 1 No   Depression 01FEB2019 Ongoing 2 No   Aches and Pains [id number] Ongoing 2 No   Anxiety 01FEB2019 Ongoing 2 No         Ezzard Flax, LPNII, CCRP  Study Coordinator  Pgr: [phone]

## 2019-01-06 NOTE — Unmapped (Signed)
Spoke with pt - let him know that due to his thyroid levels Dr. Vernell Barrier had to adjust his synthroid dose to and prescription sent to [facility]. Pt will start tomorrow.

## 2019-01-08 NOTE — Unmapped (Signed)
Addended byFrederic Jericho on: 01/07/2019 10:48 PM     Modules accepted: Orders

## 2019-01-09 LAB — TESTOSTERONE, FREE, TOTAL

## 2019-01-10 MED FILL — XTANDI 40 MG CAPSULE: ORAL | 30 days supply | Qty: 120 | Fill #3

## 2019-01-10 MED FILL — XTANDI 40 MG CAPSULE: 30 days supply | Qty: 120 | Fill #3 | Status: AC

## 2019-01-18 ENCOUNTER — Other Ambulatory Visit: Payer: Self-pay | Admitting: Family Medicine

## 2019-01-18 DIAGNOSIS — I10 Essential (primary) hypertension: Secondary | ICD-10-CM

## 2019-01-21 ENCOUNTER — Telehealth: Payer: Self-pay | Admitting: Family Medicine

## 2019-01-21 NOTE — Telephone Encounter (Signed)
Pt requesting a return call. Need clarification on his medicaitons (lotensin and hydrochlorothiazide)

## 2019-01-28 ENCOUNTER — Other Ambulatory Visit: Payer: Self-pay

## 2019-01-28 ENCOUNTER — Ambulatory Visit: Payer: Medicare HMO | Admitting: Family Medicine

## 2019-01-28 ENCOUNTER — Encounter: Payer: Self-pay | Admitting: Family Medicine

## 2019-01-28 VITALS — BP 96/54 | HR 89 | Temp 98.3°F | Resp 16 | Ht 70.0 in | Wt 182.1 lb

## 2019-01-28 DIAGNOSIS — F320 Major depressive disorder, single episode, mild: Secondary | ICD-10-CM

## 2019-01-28 DIAGNOSIS — C61 Malignant neoplasm of prostate: Secondary | ICD-10-CM | POA: Diagnosis not present

## 2019-01-28 DIAGNOSIS — D692 Other nonthrombocytopenic purpura: Secondary | ICD-10-CM

## 2019-01-28 DIAGNOSIS — C775 Secondary and unspecified malignant neoplasm of intrapelvic lymph nodes: Secondary | ICD-10-CM

## 2019-01-28 DIAGNOSIS — Z8719 Personal history of other diseases of the digestive system: Secondary | ICD-10-CM | POA: Diagnosis not present

## 2019-01-28 DIAGNOSIS — F32A Depression, unspecified: Secondary | ICD-10-CM

## 2019-01-28 DIAGNOSIS — I10 Essential (primary) hypertension: Secondary | ICD-10-CM

## 2019-01-28 DIAGNOSIS — M810 Age-related osteoporosis without current pathological fracture: Secondary | ICD-10-CM

## 2019-01-28 DIAGNOSIS — H9193 Unspecified hearing loss, bilateral: Secondary | ICD-10-CM

## 2019-01-28 DIAGNOSIS — I700 Atherosclerosis of aorta: Secondary | ICD-10-CM | POA: Diagnosis not present

## 2019-01-28 DIAGNOSIS — R791 Abnormal coagulation profile: Secondary | ICD-10-CM | POA: Insufficient documentation

## 2019-01-28 DIAGNOSIS — R69 Illness, unspecified: Secondary | ICD-10-CM | POA: Diagnosis not present

## 2019-01-28 MED ORDER — ESCITALOPRAM OXALATE 10 MG PO TABS: 10.0000 mg | ORAL_TABLET | Freq: Every day | ORAL | 1 refills | Status: DC

## 2019-01-28 MED ORDER — BENAZEPRIL HCL 10 MG PO TABS: 10.0000 mg | ORAL_TABLET | Freq: Every day | ORAL | 0 refills | Status: DC

## 2019-01-28 NOTE — Progress Notes (Signed)
Name: Kevin Donovan   MRN: [id number]    DOB: [birth year]   Date:01/28/2019       Progress Note  Subjective  Chief Complaint  Chief Complaint  Patient presents with  . Medication Refill  . Hypertension  . Prostate Cancer  . Depression  . Bradycardia    HPI  HTN: bp spiked during use of Zytiga  medication , however he has been off this regiment for prostate cancer months and bp has been going down, currently on hctz and 10 mg of lotensin, bp is very low today, no palpitation or chest pain, but has dizziness occasionally, stop hctz today, recheck in one month we may be able to stop lotensin on his next visit   Prostate Cancer: he was diagnosed in 2008 in 2018diagnosed with metastatic cancer, and resumed therapy May 2018, he did not respond to [place] , lupron is on hold because of COVID-19, currently on Xtandi and denies any side effects  Hypothyroidism: taking levothyroxine and managed at [facility], he denies palpitation, change in bowel movements   Calcification abdominal aorta: found on CT abdomen, he has metastatic prostate cancer, continue aspirin , he does not want to take statins at this time. Unchanged.   Depression: he states that since he started on medication ( Lexapro ) he has not noticed any difference in his behavior, however his wife has noticed a great improvement in his mood. Continue current dose   Bradycardia on EKG: done at [facility], but no change since 2014.Unchanged   Tobacco: he is not ready to quit smoking yet, he is still smoking between 3/4 -1 pack daily.  He has a daily cough sometimes productive, no wheezing and no SOB, discussed spirometry and he refuses.He is not interested in stopping smoking   History of GI bleed: he went to [facility] with bright blood per rectum, had colonoscopy, no problems since, results reviewed   Hearing loss: he states it does not bother him, but wife wanted him to say something about it, he does not want hearing aid, and does not want  to go to ENT at this time, explained he can call me if he wants to change his mind  Patient Active Problem List   Diagnosis Date Noted  . Abnormal coagulation profile 01/28/2019  . Rectal bleeding 12/17/2018  . Hypothyroidism 12/09/2018  . Essential hypertension 10/22/2018  . Bilateral edema of lower extremity 10/22/2018  . Senile purpura ([place]) 01/28/2018  . Hot flashes 05/28/2017  . Fatigue 05/28/2017  . First degree AV block 11/28/2016  . Bradycardia on ECG 11/14/2016  . Situational depression 10/31/2016  . Calcification of abdominal aorta (HCC) 10/31/2016  . Degenerative disc disease, lumbar 10/31/2016  . Prostate cancer metastatic to intrapelvic lymph node ([place]) 10/23/2016  . Malignant neoplasm of prostate ([place]) 08/27/2012  . Enlarged prostate with lower urinary tract symptoms (LUTS) 08/27/2012    Past Surgical History:  Procedure Laterality Date  . APPENDECTOMY  2011  . COLONOSCOPY  2005    Family History  Problem Relation Age of Onset  . Lupus Mother   . Heart disease Mother        CHF  . Heart disease Father     Social History   Socioeconomic History  . Marital status: Married    Spouse name: [name]  . Number of children: 1  . Years of education: Not on file  . Highest education level: Some college, no degree  Occupational History  . Not on file  Social Needs  .  Financial resource strain: Not hard at all  . Food insecurity:    Worry: Never true    Inability: Never true  . Transportation needs:    Medical: No    Non-medical: No  Tobacco Use  . Smoking status: Current Every Day Smoker    Packs/day: 0.75    Years: 72.00    Pack years: 54.00    Types: Cigarettes    Start date: 01/29/1944  . Smokeless tobacco: Never Used  Substance and Sexual Activity  . Alcohol use: No  . Drug use: No  . Sexual activity: Yes    Partners: Female  Lifestyle  . Physical activity:    Days per week: 7 days    Minutes per session: 150+ min  . Stress: Not at all   Relationships  . Social connections:    Talks on phone: Three times a week    Gets together: Once a week    Attends religious service: Never    Active member of club or organization: No    Attends meetings of clubs or organizations: Never    Relationship status: Married  . Intimate partner violence:    Fear of current or ex partner: No    Emotionally abused: No    Physically abused: No    Forced sexual activity: No  Other Topics Concern  . Not on file  Social History Narrative  . Not on file     Current Outpatient Medications:  .  alendronate (FOSAMAX) 70 MG tablet, Take by mouth., Disp: , Rfl:  .  benazepril (LOTENSIN) 10 MG tablet, Take 1 tablet (10 mg total) by mouth daily., Disp: 30 tablet, Rfl: 0 .  escitalopram (LEXAPRO) 10 MG tablet, Take 1 tablet (10 mg total) by mouth daily., Disp: 90 tablet, Rfl: 1 .  leuprolide (LUPRON) 22.5 MG injection, Inject 22.5 mg every 3 (three) months into the muscle. , Disp: , Rfl:  .  levothyroxine (SYNTHROID) 75 MCG tablet, Take 1 tablet by mouth daily., Disp: , Rfl:  .  Multiple Vitamin (MULTI-VITAMINS) TABS, Take by mouth., Disp: , Rfl:  .  tamsulosin (FLOMAX) 0.4 MG CAPS capsule, Take 1 capsule by mouth daily. 30 minutes after largest meal, Disp: , Rfl:  .  vitamin C (ASCORBIC ACID) 250 MG tablet, Take 250 mg by mouth daily., Disp: , Rfl:  .  XTANDI 40 MG capsule, Take 160 mg by mouth daily. , Disp: , Rfl:  .  aspirin 81 MG tablet, Take 1 tablet by mouth daily., Disp: , Rfl:   Allergies  Allergen Reactions  . Diazepam Other (See Comments)    Hallucinations, altered mental status    I personally reviewed active problem list, medication list, allergies, family history with the patient/caregiver today.   ROS  Ten systems reviewed and is negative except as mentioned in HPI   Objective  Vitals:   01/28/19 1030  BP: (!) 96/54  Pulse: 89  Resp: 16  Temp: 98.3 F (36.8 C)  TempSrc: Oral  SpO2: 97%  Weight: 182 lb 1.6 oz (82.6  kg)  Height: 5\' 10"  (1.778 m)    Body mass index is 26.13 kg/m.  Physical Exam  Constitutional: Patient appears well-developed and well-nourished.  No distress.  HEENT: head atraumatic, normocephalic, pupils equal and reactive to light,  neck supple, throat within normal limits Cardiovascular: Normal rate, regular rhythm and normal heart sounds.  No murmur heard. No BLE edema. Pulmonary/Chest: Effort normal and breath sounds normal. No respiratory distress. Abdominal: Soft.  There is no tenderness. Psychiatric: Patient has a normal mood and affect. behavior is normal. Judgment and thought content normal.  PHQ2/9: Depression screen [facility] 2/9 01/28/2019 10/22/2018 10/07/2018 07/30/2018 01/28/2018  Decreased Interest 1 0 0 0 0  Down, Depressed, Hopeless 0 0 0 0 0  PHQ - 2 Score 1 0 0 0 0  Altered sleeping 0 0 0 0 -  Tired, decreased energy 2 0 0 0 -  Change in appetite 0 0 0 0 -  Feeling bad or failure about yourself  0 0 0 0 -  Trouble concentrating 0 0 0 0 -  Moving slowly or fidgety/restless 0 0 0 0 -  Suicidal thoughts 0 0 0 0 -  PHQ-9 Score 3 0 0 0 -  Difficult doing work/chores Not difficult at all Not difficult at all Not difficult at all Not difficult at all Not difficult at all    phq 9 is positive   Fall Risk: Fall Risk  01/28/2019 10/22/2018 10/07/2018 07/30/2018 01/28/2018  Falls in the past year? 0 0 0 0 No  Number falls in past yr: 0 0 0 - -  Injury with Fall? 0 0 0 0 -  Follow up - Falls evaluation completed Falls evaluation completed - -     Functional Status Survey: Is the patient deaf or have difficulty hearing?: No Does the patient have difficulty seeing, even when wearing glasses/contacts?: No Does the patient have difficulty concentrating, remembering, or making decisions?: No Does the patient have difficulty walking or climbing stairs?: No Does the patient have difficulty dressing or bathing?: No Does the patient have difficulty doing errands alone such as visiting  a doctor's office or shopping?: No    Assessment & Plan  1. Essential hypertension  - benazepril (LOTENSIN) 10 MG tablet; Take 1 tablet (10 mg total) by mouth daily.  Dispense: 30 tablet; Refill: 0  2. Mild depression (HCC)  - escitalopram (LEXAPRO) 10 MG tablet; Take 1 tablet (10 mg total) by mouth daily.  Dispense: 90 tablet; Refill: 1  3. Bilateral hearing loss, unspecified hearing loss type  He does not want to go see ENT , states does not want hearing aid   4. Senile purpura (HCC)  stable  5. Calcification of abdominal aorta (HCC)  On ace, not on statin therapy and does not want to go on it  6. Prostate cancer metastatic to intrapelvic lymph node ([place])  Still going to [facility]  7. Age-related osteoporosis without current pathological fracture  Continue alendronate  8. History of GI bleed  Went to [facility] had colonoscopy, no episodes since  Aspirin has been on hold

## 2019-01-31 NOTE — Unmapped (Signed)
[facility] Clinical Assessment & Refill Coordination Note    John Watkins, DOB: May 31, 1936  Phone: [phone] (home)     All above HIPAA information was verified with patient.     Specialty Medication(s):   Hematology/Oncology: [name]     Current Outpatient Medications   Medication Sig Dispense Refill   ??? alendronate (FOSAMAX) 70 MG tablet Take 1 tablet (70 mg total) by mouth every seven (7) days. (Patient taking differently: Take 70 mg by mouth Every Monday. ) 12.85 tablet 3   ??? ascorbic acid, vitamin C, (VITAMIN C) 250 MG tablet Take 250 mg by mouth.     ??? benazepriL (LOTENSIN) 20 MG tablet Take 20 mg by mouth daily. Pt said he was advised to split pills and take 10 mg per day     ??? calcium carbonate (OS-CAL) 600 mg calcium (1,500 mg) tablet Take 1 tablet by mouth daily.     ??? enzalutamide (XTANDI) 40 mg capsule Take 4 capsules (160 mg total) by mouth daily. 120 capsule 6   ??? escitalopram oxalate (LEXAPRO) 10 MG tablet Take 1 tablet (10 mg total) by mouth daily. 90 tablet 1   ??? hydroCHLOROthiazide (HYDRODIURIL) 12.5 MG tablet Take 6.25 mg by mouth daily.     ??? leuprolide (LUPRON) 22.5 mg injection Inject 22.5 mg into the muscle Every three (3) months.     ??? levothyroxine (SYNTHROID) 75 MCG tablet Take 1 tablet (75 mcg total) by mouth daily. 30 tablet 7   ??? multivitamin (THERAGRAN) per tablet Take 1 tablet by mouth daily.     ??? tamsulosin (FLOMAX) 0.4 mg capsule Take 1 capsule (0.4 mg total) by mouth daily. 90 capsule 3     No current facility-administered medications for this visit.         Changes to medications: Kato reports no changes at this time.    Allergies   Allergen Reactions   ??? Valium [Diazepam] Other (See Comments)     Hallucinations, altered mental status  Hallucinations, altered mental status         Changes to allergies: No    SPECIALTY MEDICATION ADHERENCE     Xtandi 40 mg: 9 days of medicine on hand     Medication Adherence    Adherence tools used: medication list   Other adherence tool:  routine   Support network for adherence:  family member          Specialty medication(s) dose(s) confirmed: Regimen is correct and unchanged.     Are there any concerns with adherence? No    Adherence counseling provided? Not needed    CLINICAL MANAGEMENT AND INTERVENTION      Clinical Benefit Assessment:    Do you feel the medicine is effective or helping your condition? Yes    Clinical Benefit counseling provided? Progress note from 01/06/19 shows evidence of clinical benefit    Adverse Effects Assessment:    Are you experiencing any side effects? No    Are you experiencing difficulty administering your medicine? No    Quality of Life Assessment:    How many days over the past month did your prostate cancer  keep you from your normal activities? For example, brushing your teeth or getting up in the morning. 0    Have you discussed this with your provider? Not needed    Therapy Appropriateness:    Is therapy appropriate? Yes, therapy is appropriate and should be continued    DISEASE/MEDICATION-SPECIFIC INFORMATION  N/A    PATIENT SPECIFIC NEEDS     ? Does the patient have any physical, cognitive, or cultural barriers? No    ? Is the patient high risk? No     ? Does the patient require a Care Management Plan? No     ? Does the patient require physician intervention or other additional services (i.e. nutrition, smoking cessation, social work)? No      SHIPPING     Specialty Medication(s) to be Shipped:   Hematology/Oncology: [name]    Other medication(s) to be shipped: none     Changes to insurance: No    Delivery Scheduled: Yes, Expected medication delivery date: 02/04/19.     Medication will be delivered via Same Day Courier to the confirmed home address in [facility]    The patient will receive a drug information handout for each medication shipped and additional FDA Medication Guides as required.  Verified that patient has previously received a Conservation officer, historic buildings.    Breck Coons [facility] Specialty Pharmacist

## 2019-02-01 NOTE — Unmapped (Signed)
Hi,     Pt's wife contacted the Communication Center regarding the following:    - pt is scheduled for scans tomorrow but they understood Dr. Vernell Barrier wanted those r/s'd.    Please contact [name] at [phone].    Thanks in advance,    Deatra Ina  [facility]   [phone]

## 2019-02-01 NOTE — Unmapped (Signed)
Attempted to call pt for pre-appointment screening, pt did not answer. LVM instructing to return call to [phone] for screening. Informed patient of the oncology visitor policy (1 visitor, 81 years old or older) and process of screening prior to entering [facility]. Explained that patient will need to wait to come to their appointment until they have heard from someone if they develop any symptoms overnight (cough, shortness of breath, fever 100+, nausea, diarrhea, nasal congestion). Unable to complete med-rec at this time.

## 2019-02-01 NOTE — Unmapped (Signed)
Returned pt's call. Confirming schedule for tomorrows visits.Will call back with any further questions.

## 2019-02-02 ENCOUNTER — Institutional Professional Consult (permissible substitution): Admit: 2019-02-02 | Discharge: 2019-02-03 | Payer: MEDICARE

## 2019-02-02 ENCOUNTER — Ambulatory Visit: Admit: 2019-02-02 | Discharge: 2019-02-03 | Payer: MEDICARE

## 2019-02-02 ENCOUNTER — Encounter
Admit: 2019-02-02 | Discharge: 2019-02-03 | Payer: MEDICARE | Attending: Hematology & Oncology | Primary: Hematology & Oncology

## 2019-02-02 DIAGNOSIS — C61 Malignant neoplasm of prostate: Principal | ICD-10-CM

## 2019-02-02 DIAGNOSIS — C775 Secondary and unspecified malignant neoplasm of intrapelvic lymph nodes: Secondary | ICD-10-CM

## 2019-02-02 LAB — CBC W/ AUTO DIFF
BASOPHILS ABSOLUTE COUNT: 0 10*9/L (ref 0.0–0.1)
BASOPHILS RELATIVE PERCENT: 0.5 %
EOSINOPHILS ABSOLUTE COUNT: 0.3 10*9/L (ref 0.0–0.4)
EOSINOPHILS RELATIVE PERCENT: 5.3 %
HEMATOCRIT: 36.7 % — ABNORMAL LOW (ref 41.0–53.0)
HEMOGLOBIN: 11.9 g/dL — ABNORMAL LOW (ref 13.5–17.5)
LARGE UNSTAINED CELLS: 2 % (ref 0–4)
LYMPHOCYTES RELATIVE PERCENT: 25.5 %
MEAN CORPUSCULAR HEMOGLOBIN CONC: 32.3 g/dL (ref 31.0–37.0)
MEAN CORPUSCULAR HEMOGLOBIN: 32.9 pg (ref 26.0–34.0)
MEAN CORPUSCULAR VOLUME: 101.9 fL — ABNORMAL HIGH (ref 80.0–100.0)
MEAN PLATELET VOLUME: 8.3 fL (ref 7.0–10.0)
MONOCYTES ABSOLUTE COUNT: 0.4 10*9/L (ref 0.2–0.8)
MONOCYTES RELATIVE PERCENT: 6.4 %
NEUTROPHILS ABSOLUTE COUNT: 3.7 10*9/L (ref 2.0–7.5)
NEUTROPHILS RELATIVE PERCENT: 60.1 %
PLATELET COUNT: 459 10*9/L — ABNORMAL HIGH (ref 150–440)
RED BLOOD CELL COUNT: 3.6 10*12/L — ABNORMAL LOW (ref 4.50–5.90)
RED CELL DISTRIBUTION WIDTH: 16.2 % — ABNORMAL HIGH (ref 12.0–15.0)
WBC ADJUSTED: 6.2 10*9/L (ref 4.5–11.0)

## 2019-02-02 LAB — PROSTATE SPECIFIC ANTIGEN: Prostate specific Ag:MCnc:Pt:Ser/Plas:Qn:: 5.02 — ABNORMAL HIGH

## 2019-02-02 LAB — ALBUMIN: Albumin:MCnc:Pt:Ser/Plas:Qn:: 3.6

## 2019-02-02 LAB — SMEAR REVIEW

## 2019-02-02 LAB — LIPID PANEL
CHOLESTEROL/HDL RATIO SCREEN: 3.4 (ref <5.0)
CHOLESTEROL: 150 mg/dL (ref 100–199)
HDL CHOLESTEROL: 44 mg/dL (ref 40–59)
LDL CHOLESTEROL CALCULATED: 86 mg/dL (ref 60–99)
NON-HDL CHOLESTEROL: 106 mg/dL

## 2019-02-02 LAB — GLUCOSE RANDOM: Glucose:MCnc:Pt:Ser/Plas:Qn:: 106

## 2019-02-02 LAB — CK: CREATINE KINASE TOTAL: 44 U/L — ABNORMAL LOW (ref 45.0–250.0)

## 2019-02-02 LAB — BILIRUBIN TOTAL: Bilirubin:MCnc:Pt:Ser/Plas:Qn:: 0.2

## 2019-02-02 LAB — CREATININE
Creatinine:MCnc:Pt:Ser/Plas:Qn:: 0.97
EGFR CKD-EPI AA MALE: 84 mL/min/{1.73_m2} (ref >=60)

## 2019-02-02 LAB — HDL CHOLESTEROL: Cholesterol.in HDL:MCnc:Pt:Ser/Plas:Qn:: 44

## 2019-02-02 LAB — BLOOD UREA NITROGEN: Urea nitrogen:MCnc:Pt:Ser/Plas:Qn:: 15

## 2019-02-02 LAB — CALCIUM: Calcium:MCnc:Pt:Ser/Plas:Qn:: 9.5

## 2019-02-02 LAB — CREATINE KINASE TOTAL: Creatine kinase:CCnc:Pt:Ser/Plas:Qn:: 44 — ABNORMAL LOW

## 2019-02-02 LAB — CHLORIDE: Chloride:SCnc:Pt:Ser/Plas:Qn:: 105

## 2019-02-02 LAB — PHOSPHORUS: Phosphate:MCnc:Pt:Ser/Plas:Qn:: 3.8

## 2019-02-02 LAB — THYROID STIMULATING HORMONE: Thyrotropin:ACnc:Pt:Ser/Plas:Qn:: 19.38 — ABNORMAL HIGH

## 2019-02-02 LAB — MONOCYTES RELATIVE PERCENT: Lab: 6.4

## 2019-02-02 LAB — POTASSIUM: Potassium:SCnc:Pt:Ser/Plas:Qn:: 4.4

## 2019-02-02 LAB — FREE T4: Thyroxine.free:MCnc:Pt:Ser/Plas:Qn:: 1.26

## 2019-02-02 LAB — ALKALINE PHOSPHATASE: Alkaline phosphatase:CCnc:Pt:Ser/Plas:Qn:: 65

## 2019-02-02 LAB — CO2: Carbon dioxide:SCnc:Pt:Ser/Plas:Qn:: 25

## 2019-02-02 LAB — AST (SGOT): Aspartate aminotransferase:CCnc:Pt:Ser/Plas:Qn:: 20

## 2019-02-02 LAB — ALT (SGPT): Alanine aminotransferase:CCnc:Pt:Ser/Plas:Qn:: 11

## 2019-02-02 LAB — SODIUM: Sodium:SCnc:Pt:Ser/Plas:Qn:: 138

## 2019-02-02 LAB — URIC ACID: Urate:MCnc:Pt:Ser/Plas:Qn:: 4.3

## 2019-02-02 LAB — MAGNESIUM: Magnesium:MCnc:Pt:Ser/Plas:Qn:: 2

## 2019-02-02 LAB — LACTATE DEHYDROGENASE: Lactate dehydrogenase:CCnc:Pt:Ser/Plas:Qn:: 414

## 2019-02-02 NOTE — Unmapped (Signed)
Pts wife stated that he is doing all appts from 05/13 (today) and 05/14  while he is in hospital already and will have no appts for tomorrow.

## 2019-02-03 ENCOUNTER — Ambulatory Visit: Admit: 2019-02-03 | Discharge: 2019-02-03 | Payer: MEDICARE

## 2019-02-03 ENCOUNTER — Ambulatory Visit: Admit: 2019-02-03 | Discharge: 2019-02-03 | Payer: MEDICARE | Attending: Medical Oncology | Primary: Medical Oncology

## 2019-02-03 ENCOUNTER — Institutional Professional Consult (permissible substitution)
Admit: 2019-02-03 | Discharge: 2019-02-03 | Payer: MEDICARE | Attending: Nurse Practitioner | Primary: Nurse Practitioner

## 2019-02-03 DIAGNOSIS — R946 Abnormal results of thyroid function studies: Secondary | ICD-10-CM

## 2019-02-03 DIAGNOSIS — C775 Secondary and unspecified malignant neoplasm of intrapelvic lymph nodes: Secondary | ICD-10-CM

## 2019-02-03 DIAGNOSIS — C61 Malignant neoplasm of prostate: Principal | ICD-10-CM

## 2019-02-03 DIAGNOSIS — R5383 Other fatigue: Secondary | ICD-10-CM

## 2019-02-03 NOTE — Unmapped (Signed)
VIRTUAL VISIT DOCUMENTATION    Reason for call: follow up prostate cancer    Oncology history:    2010: IMRT to prostate for T1c prostate adenocarcinoma, biopsy with up to Gleason 4+3=7.  Subsequent PSA rise with negative saturation biopsy on 11/17/12.  ?? 04/05/13: 8.3  ?? 08/29/13: 12.8  ?? 02/09/14: 13.3  10/2013: MRI with enlarged left obturator, iliac and para-aortic nodes up to 3 cm consistent with metastatic disease. Bilateral ischia and L pelvis with bony areas <49mm of unclear etiology (bone islands vs. metastases).  10/2013: Lupron started  Subsequent PSA:  ?? 05/01/14: 1.4  ?? 08/02/14: 1.4  ?? 11/03/14: 1.7  01/18/16: Bone scan: no metastases.  01/18/16: CT A/P: 2.6 cm left external iliac chain hyperenhancing node (series 6, image 57). Several prominent subcentimeter lymph nodes are seen along the left para-aortic retroperitoneum.  Prostate is enlarged measuring 6.0 x 6.7 x 9.3 cm. [Possible lipoma in gluteus maximus, or could be related to Lupron injection, discussed w urology, no action warranted.]  10/16/16: restaging:              - CT CAP: Interval increase in size of left external and common iliac chain as well as periaortic lymph nodes, worrisome for metastatic disease.              - Bone Scan: No evidence of osseous metastatic disease.  5/18: Abiraterone/prednisone  6/19: BMD (c/w 2016): Lumbar spine: ??Normal bone density. ??The measurement has decreased significantly since prior study.  Left proximal femur: Mildly low bone density. ??The measurement has decreased significantly since prior study.  04/15/18: PSA 10.6  08/04/18: PSA rising on abiraterone, now 16.7  08/04/18: Bone scan: No evidence of osseous metastasis.  08/04/18: CT CAP: Interval increase in external iliac, periaortic, and aortocaval lymphadenopathy concerning for disease progression.  1/20: Start enzalutamide on trial.    Interval hx 02/03/2019    Today, he reports mild fatigue (which is stable)    He also notes some mild low back pain, which has been present, but has increased some when he walks. Used to take Aleve for this, but he doesn't anymore since he had the GI bleed. Nothing concerning on recent imaging    Continues to have daytime urinary frequency and nocturia. He takes flomax, which he says helps    Has been home schooling his 66 year old son, who is autistic, during the pandemic    No n/v/d/c, SOB, CP, LE edema    ECOG = 1    Allergies   Allergen Reactions   ??? Valium [Diazepam] Other (See Comments)     Hallucinations, altered mental status  Hallucinations, altered mental status     ]  Prior to Admission medications    Medication Sig Start Date End Date Taking? Authorizing Provider   alendronate (FOSAMAX) 70 MG tablet Take 1 tablet (70 mg total) by mouth every seven (7) days.  Patient taking differently: Take 70 mg by mouth Every Monday.  04/15/18 04/15/19  Chrisandra Netters, MD   ascorbic acid, vitamin C, (VITAMIN C) 250 MG tablet Take 250 mg by mouth.    Historical Provider, MD   benazepriL (LOTENSIN) 20 MG tablet Take 20 mg by mouth daily. Pt said he was advised to split pills and take 10 mg per day    Historical Provider, MD   calcium carbonate (OS-CAL) 600 mg calcium (1,500 mg) tablet Take 1 tablet by mouth daily.    Historical Provider, MD   enzalutamide [name]) 40  mg capsule Take 4 capsules (160 mg total) by mouth daily. 09/30/18   Chrisandra Netters, MD   escitalopram oxalate (LEXAPRO) 10 MG tablet Take 1 tablet (10 mg total) by mouth daily. 10/23/16   Chrisandra Netters, MD   hydroCHLOROthiazide (HYDRODIURIL) 12.5 MG tablet Take 6.25 mg by mouth daily. 10/22/18   Historical Provider, MD   leuprolide (LUPRON) 22.5 mg injection Inject 22.5 mg into the muscle Every three (3) months.    Historical Provider, MD   levothyroxine (SYNTHROID) 75 MCG tablet Take 1 tablet (75 mcg total) by mouth daily. 01/06/19 09/03/19  Chrisandra Netters, MD   multivitamin [facility]) per tablet Take 1 tablet by mouth daily.    Historical Provider, MD   tamsulosin (FLOMAX) 0.4 mg capsule Take 1 capsule (0.4 mg total) by mouth daily. 09/30/18 09/30/19  Chrisandra Netters, MD       Data:    CT CAP 02/02/2019    IMPRESSION:  ??  No evidence of  new intrathoracic metastatic disease.    IMPRESSION:  -Multistation abdominopelvic lymphadenopathy, overall stable to slightly decreased in size when compared to CT from 12/08/2018 with the exception of a few bilateral inguinal lymph nodes which are minimally larger in size.  -Otherwise, no new sites of metastatic disease in the abdomen or pelvis.  -Additional chronic/incidental findings as above, unchanged.  -Please see separately dictated CT chest from same date for detailed findings above the diaphragm    Bone scan 02/02/2019    IMPRESSION:  --No new abnormal foci of uptake to suggest osseous metastatic disease.    Lab Results   Component Value Date    PSA 5.02 (H) 02/02/2019    PSA 6.15 (H) 01/05/2019    PSA 6.53 (H) 12/09/2018    PSA 6.63 (H) 11/11/2018    PSA 20.10 (H) 10/14/2018    PSA 19.80 (H) 09/29/2018       Chemistry        Component Value Date/Time    NA 138 02/02/2019 1002    K 4.4 02/02/2019 1002    CL 105 02/02/2019 1002    CO2 25.0 02/02/2019 1002    BUN 15 02/02/2019 1002    CREATININE 0.97 02/02/2019 1002    CREATININE 0.9 10/16/2016 1038    GLU 106 02/02/2019 1002        Component Value Date/Time    CALCIUM 9.5 02/02/2019 1002    ALKPHOS 65 02/02/2019 1002    AST 20 02/02/2019 1002    ALT 11 02/02/2019 1002    BILITOT 0.2 02/02/2019 1002          Lab Results   Component Value Date    WBC 6.2 02/02/2019    RBC 3.60 (L) 02/02/2019    HGB 11.9 (L) 02/02/2019    HCT 36.7 (L) 02/02/2019    MCV 101.9 (H) 02/02/2019    MCH 32.9 02/02/2019    MCHC 32.3 02/02/2019    RDW 16.2 (H) 02/02/2019    PLT 459 (H) 02/02/2019       Lab Results   Component Value Date    TSH 19.380 (H) 02/02/2019    T4TOTAL 3.79 (L) 12/09/2018         Assessment and Plan:    1. Metastatic castrate resistant prostate cancer  a. He has been transitioned to Eligard 45 mg (every 6 month dosing) to minimize risk of exposure to health care system during COVID19 pandemic. Last given 02/02/2019, due again on or after 08/05/2019  b. Continue CPI-1201-201 (ProStar) trial, on enzalutamide monotherapy arm  c. Remembers to take his medication every day  d. Ok to take some Tylenol as needed for back pain  2. Urinary frequency  a. Continue tamsulosin  3. Bone health  a. Last bone density 02/2018  b. Continue Fosamax, calcium/vit D  4. Hypothyroidism  a. TSH gettting better on current dose  b. Continue Synthroid 75 mcg and monitor  5. Fatigue (grade 1)  a. Continue to exercise      continue excellent self care (no tobacco, minimal alcohol, balanced diet, regular exercise, adequate sleep, follow up with health care providers, etc).        RTC per protocol        [name] is clear on the need to call with any questions or concerns in the interim      Albertina Parr   02/03/2019, 10:41 AM     Patient consented to the virtual visit.     Place of Service (patient location and state): Home;   Other parties present (relationship and consent give by patient)na    I spent 30 minutes on the phone with the patient. I spent an additional 5 minutes on pre- and post-visit activities.     The patient was physically located in [place] or a state in which I am permitted to provide care. The patient and/or parent/gauardian understood that s/he may incur co-pays and cost sharing, and agreed to the telemedicine visit. The visit was completed via phone and/or video, which was appropriate and reasonable under the circumstances given the patient's presentation at the time.    The patient and/or parent/guardian has been advised of the potential risks and limitations of this mode of treatment (including, but not limited to, the absence of in-person examination) and has agreed to be treated using telemedicine. The patient's/patient's family's questions regarding telemedicine have been answered.     If the phone/video visit was completed in an ambulatory setting, the patient and/or parent/guardian has also been advised to contact their provider???s office for worsening conditions, and seek emergency medical treatment and/or call 911 if the patient deems either necessary.

## 2019-02-04 MED FILL — XTANDI 40 MG CAPSULE: 30 days supply | Qty: 120 | Fill #4 | Status: AC

## 2019-02-04 MED FILL — XTANDI 40 MG CAPSULE: ORAL | 30 days supply | Qty: 120 | Fill #4

## 2019-02-24 NOTE — Unmapped (Signed)
Necessary alterations to study procedures being made to minimize their risk of exposure to COVID-19. The patient was informed of all changes to study procedures, including reasonable alternatives. The patient will continue to be notified of new information as it becomes available.     Clinical Study: CPI-1205-201: A Phase 1b/2 Study of CPI-1205, a Small Molecule Inhibitor of EZH2, Combined with Enzalutamide or Abiraterone/Prednisone in Patients with Metastatic Castration Resistant Prostate Cancer     Study Treatment: Randomized Arm Phase 2 (Enzalutamide ??? Control Arm)     Date of Service: 14MAY2020     Cycle / Day: C5D1     Performance Status: ECOG PS: 0     Narrative: Patient had phone visit with Barkley Bruns, NP on 14MAY2020 for C5D1 of CPI-1205 protocol therapy. No physical exam was performed due to COIVD-19 precautions.  Labs drawn prior to clinic visit currently Gr 1 Hypothyroidism, will continue to monitor. Will hold Lupron for now to reduce exposure. Complaints of mild to moderate fatigue, not interfering with ADL???s. Patient randomized to control arm on 21JAN2020.      Plan: Patient will be contacted in 4 weeks for C6D1 on 11JUN2020. Scans scheduled for [id number]. Patient and wife extensively educated on side effects, dosing schedule, medication diary and both verbalized understanding. Contact information for study coordinator and Dr. Vernell Barrier provided and patient agrees to contact office with any questions or concerns.       Enzalutamide Med Compliance Log  Cycle  # Pills Dispensed  # Pills  Returned  # Pills  Dosed # Pills in Planned Dose % Dose Taken  Comments   Cycle 1 120 12 108 108 100    Cycle 2 120 12 108 108 100    Cycle 3 120     Pending - COVID-19   Cycle 4 120     Pending - COVID-19   Cycle 5 120     Pending - COVID-19   Cycle 6           Adverse Events Log:     Diagnosis Start Date   Stop Date Grade   Attribution  1: Unrelated 2: Unlikely 3: Possible 4: Probable 5: Definite 6: Unknown CS/NCS Enzalutamide Prednisone    Hypothyroidism 19MAR2020 Ongoing 1 1 1  NCS   Fatigue 11MAR2020 Ongoing 1 3 1  NCS   Anemia 27MAR2020 Ongoing 1 2 1  NCS   Hypocalcemia 29MAR2020 15APR2020 1 2 1  NCS                         Concomitant Medication Log: New medication highlighted in yellow.      Medication Dose/  Route Start Date Stop Date Indication Related to AE?  (yes/no, specify if yes)   Tamsulosin   0.4mg  PO QD 10JAN2020 Ongoing  Nocturia  Med Hx   Alendronate   70mg  PO Q Week 25JUL2019 Ongoing Health Maintenance Med Hx   Aspirin 81mg  PO  QD 30JAN2014 Ongoing Cardiac Prophylaxis Med Hx   Acetaminophen 500mg  PO Q6o PRN [id number] Ongoing Aches/Pains Med Hx   Amlodipine-Benazepril 5/20mg  PO QD 09MAR2018 16JAN2020 Hypertension Med Hx   Escitalopram Oxalate 10mg  PO BID 01FEB2019 Ongoing  Anxiety/Depression Med Hx   Vitamin C   250mg  PO  3 tabs Q8o 01MAR2018 Ongoing General Health Med Hx   Calcium Carbonate 600mg  PO QD 24NOV2019 Ongoing Health Maintenance Med Hx   Prednisone 10mg  PO QD [id number] 07/2018 Prostate CA Med Hx   Abiraterone 250mg  PO 4 tab  QD [id number] 07/2018 Prostate CA Med Hx   Multivitamin 1 Tab PO   QD 04FEB2015 Ongoing General Health Med Hx   Leuprolide 45mg  IM 04FEB2015 Ongoing Prostate CA Med Hx   Benazepril HCL 20mg  PO QD 17JAN2020 Ongoing Hypertension Med Hx   Levothyroxine PO QD 19MAR2020 16APR2020 Hypothyroidism A/E   Levothyroxine PO QD 17APR2020 Ongoing Hypothyroidism AE      Medical History  Diagnosis Start Date   Stop Date Grade   Clinically significant (yes/no)   Inguinal Hernia [id number] 2 No   Allergic Rhinitis 1995 1995 2 No   Bilateral Lower Extremity Edema 16DEC2019 Ongoing 1 No   Hypertension 03MAR2013 Ongoing 2 No   Nocutria [id number] Ongoing 1 No   Fatigue unAPR2013 Ongoing 1 No   Urinary Urgency [id number] Ongoing 1 No   Urinary Retention unAPR2013 Ongoing 1 No   Depression 01FEB2019 Ongoing 2 No   Aches and Pains [id number] Ongoing 2 No   Anxiety 01FEB2019 Ongoing 2 No Ezzard Flax, LPNII, CCRP  Study Coordinator  Pgr: [phone]

## 2019-02-28 ENCOUNTER — Ambulatory Visit (INDEPENDENT_AMBULATORY_CARE_PROVIDER_SITE_OTHER): Payer: Medicare HMO

## 2019-02-28 ENCOUNTER — Other Ambulatory Visit: Payer: Self-pay

## 2019-02-28 VITALS — BP 138/56 | HR 77 | Temp 99.1°F | Resp 14 | Ht 70.0 in | Wt 182.5 lb

## 2019-02-28 DIAGNOSIS — I10 Essential (primary) hypertension: Secondary | ICD-10-CM

## 2019-02-28 NOTE — Patient Instructions (Signed)
Patient is here for a blood pressure check. Patient denies chest pain, palpitations, shortness of breath or visual disturbances. At previous visit blood pressure was 96/54 with a heart rate of 89. Today during nurse visit first check blood pressure was 138/56.    Patient was informed that this information will be given to Kevin Ensign, FNP since his PCP (Dr. Steele Sizer) is out of the office for the week and if she has any questions or concerns then someone will give him a call.

## 2019-03-03 ENCOUNTER — Telehealth: Admit: 2019-03-03 | Discharge: 2019-03-03 | Payer: MEDICARE | Attending: Medical Oncology | Primary: Medical Oncology

## 2019-03-03 ENCOUNTER — Ambulatory Visit: Admit: 2019-03-03 | Discharge: 2019-03-03 | Payer: MEDICARE

## 2019-03-03 DIAGNOSIS — R946 Abnormal results of thyroid function studies: Secondary | ICD-10-CM

## 2019-03-03 DIAGNOSIS — R799 Abnormal finding of blood chemistry, unspecified: Principal | ICD-10-CM

## 2019-03-03 DIAGNOSIS — C61 Malignant neoplasm of prostate: Secondary | ICD-10-CM

## 2019-03-03 DIAGNOSIS — C775 Secondary and unspecified malignant neoplasm of intrapelvic lymph nodes: Secondary | ICD-10-CM

## 2019-03-03 DIAGNOSIS — R791 Abnormal coagulation profile: Secondary | ICD-10-CM

## 2019-03-03 LAB — THYROID STIMULATING HORMONE
Thyrotropin:ACnc:Pt:Ser/Plas:Qn:: 14.1 — ABNORMAL HIGH
Thyrotropin:ACnc:Pt:Ser/Plas:Qn:: 14.61 — ABNORMAL HIGH

## 2019-03-03 LAB — FREE T4: Thyroxine.free:MCnc:Pt:Ser/Plas:Qn:: 1.26

## 2019-03-03 LAB — T4, FREE: FREE T4: 1.26 ng/dL (ref 0.71–1.40)

## 2019-03-03 NOTE — Unmapped (Signed)
Nice speaking with you today.   If you have any questions please call the Nurse Navigator in the office at [phone], or [phone]) [phone].  Frederic Jericho, MD

## 2019-03-03 NOTE — Unmapped (Signed)
Medical Oncology (Prostate Cancer)    I spent 20 minutes on the audio/video with the patient. I spent an additional 20 minutes on pre- and post-visit activities.     The patient was physically located in [place] or a state in which I am permitted to provide care. The patient and/or parent/gauardian understood that s/he may incur co-pays and cost sharing, and agreed to the telemedicine visit. The visit was completed via phone and/or video, which was appropriate and reasonable under the circumstances given the patient's presentation at the time.    The patient and/or parent/guardian has been advised of the potential risks and limitations of this mode of treatment (including, but not limited to, the absence of in-person examination) and has agreed to be treated using telemedicine. The patient's/patient's family's questions regarding telemedicine have been answered.     If the phone/video visit was completed in an ambulatory setting, the patient and/or parent/guardian has also been advised to contact their provider???s office for worsening conditions, and seek emergency medical treatment and/or call 911 if the patient deems either necessary.    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 He has been asymptomatic.  He has had RT to the prostate in the past.  He is on clinical trial CPI-1201-201 (ProStar) on the enzalutamide monotherapy arm.  His course was complicated by admission with a GI bleed from diverticulum which was (likely) unrelated to study treatment.  Restaging on 02/02/19 with bone scan and CT CAP show stable lymphadenopathy and no bone metastases.  PSA decreased with initiation of trial and has remained stable.  Overall these findings suggest stable disease on trial.  No new AEs attributable to study drug.  He is feeling well but has ongoing stable feeling of fatigue and weakness particularly in legs.    Plan:  - Continue Eligard 45mg  every 6 months (last received 02/02/19)  - Continue CPI-1201-201 (ProStar) trial, enzalutamide monotherapy arm.    - Hypothyroid: Previously increased Synthroid dose to .  - I previously asked Radiation Oncology to weigh in about potential RT to the lymph nodes, and they felt this would not be beneficial and would confer toxicity without likely long-term disease control.    - Continue BP meds (started previously w abiraterone).  Continue blood pressure control with his PCP.  - Clinical genetics saw him, negative genomic screening.  - Nocturia: persistent, does not want Flomax.   - Anxiety: On Lexapro - Pharmacy ran interactions for both olaparib and abiraterone.  - Smoking: He understands risks, and the [facility] smoking cessation program here has been meeting with him for counseling.  - Bone density: Checked again in 6/19 and although only mildly low, had 10% loss in bone density from 2016-2019 on Lupron and now prednisone.  On Fosamax, calcium/D.    He is DNR/DNI.  His proxy is his wife.    Follow up per trial. Patent consented to virtual encounters.      -------------------------------------------------------    Referring physician:  Riki Altes, MD [facility])  Other physician:  Irven Easterly, MD    CC: Prostate cancer to lymph nodes and possibly bone s/p IMRT to prostate, with elevated PSA, on Lupron    Current therapy: Lupron    HPI:  2010: IMRT to prostate for T1c prostate adenocarcinoma, biopsy with up to Gleason 4+3=7.  Subsequent PSA rise with negative saturation biopsy on 11/17/12.  - 04/05/13: 8.3  -  08/29/13: 12.8  - 02/09/14: 13.3  10/2013: MRI with enlarged left obturator, iliac and para-aortic nodes up to 3 cm consistent with metastatic disease. Bilateral ischia and L pelvis with bony areas <77mm of unclear etiology (bone islands vs. metastases).  10/2013: Lupron started  Subsequent PSA:  - 05/01/14: 1.4  - 08/02/14: 1.4  - 11/03/14: 1.7  01/18/16: Bone scan: no metastases.  01/18/16: CT A/P: 2.6 cm left external iliac chain hyperenhancing node (series 6, image 57). Several prominent subcentimeter lymph nodes are seen along the left para-aortic retroperitoneum.  Prostate is enlarged measuring 6.0 x 6.7 x 9.3 cm. [Possible lipoma in gluteus maximus, or could be related to Lupron injection, discussed w urology, no action warranted.]  10/16/16: restaging:   - CT CAP: Interval increase in size of left external and common iliac chain as well as periaortic lymph nodes, worrisome for metastatic disease.   - Bone Scan: No evidence of osseous metastatic disease.  5/18: Abiraterone/prednisone  6/19: BMD (c/w 2016): Lumbar spine: ??Normal bone density. ??The measurement has decreased significantly since prior study.  Left proximal femur: Mildly low bone density. ??The measurement has decreased significantly since prior study.  04/15/18: PSA 10.6  08/04/18: PSA rising on abiraterone, now 16.7  08/04/18: Bone scan: No evidence of osseous metastasis.  08/04/18: CT CAP: Interval increase in external iliac, periaortic, and aortocaval lymphadenopathy concerning for disease progression.  1/20: Start enzalutamide on trial.  02/02/19: Bone scan restaging: No new abnormal foci of uptake to suggest osseous metastatic disease.  02/02/19: CT CAP: -Multistation abdominopelvic lymphadenopathy, overall stable to slightly decreased in size when compared to CT from 12/08/2018 with the exception of a few bilateral inguinal lymph nodes which are minimally larger in size. -Otherwise, no new sites of metastatic disease in the abdomen or pelvis. -Additional chronic/incidental findings as above, unchanged.    ROS: Nocturia stable at every 2-3 hours, doesn't bother him.  No pain.  Good appetite.  Has tolerated Lupron well.  Remainder of 10 system ROS negative.  Ongoing feeling of fatigue and weakness particularly in legs.    PE:  ECOG 0  There were no vitals taken for this visit.      Current Outpatient Medications Medication Sig Dispense Refill   ??? alendronate (FOSAMAX) 70 MG tablet Take 1 tablet (70 mg total) by mouth every seven (7) days. (Patient taking differently: Take 70 mg by mouth Every Monday. ) 12.85 tablet 3   ??? ascorbic acid, vitamin C, (VITAMIN C) 250 MG tablet Take 250 mg by mouth.     ??? benazepriL (LOTENSIN) 20 MG tablet Take 20 mg by mouth daily. Pt said he was advised to split pills and take 10 mg per day     ??? calcium carbonate (OS-CAL) 600 mg calcium (1,500 mg) tablet Take 1 tablet by mouth daily.     ??? enzalutamide (XTANDI) 40 mg capsule Take 4 capsules (160 mg total) by mouth daily. 120 capsule 6   ??? escitalopram oxalate (LEXAPRO) 10 MG tablet Take 1 tablet (10 mg total) by mouth daily. 90 tablet 1   ??? hydroCHLOROthiazide (HYDRODIURIL) 12.5 MG tablet Take 6.25 mg by mouth daily.     ??? leuprolide (LUPRON) 22.5 mg injection Inject 22.5 mg into the muscle Every three (3) months.     ??? levothyroxine (SYNTHROID) 75 MCG tablet Take 1 tablet (75 mcg total) by mouth daily. 30 tablet 7   ??? multivitamin (THERAGRAN) per tablet Take 1 tablet by mouth daily.     ???  tamsulosin (FLOMAX) 0.4 mg capsule Take 1 capsule (0.4 mg total) by mouth daily. 90 capsule 3     No current facility-administered medications for this visit.      Allergies   Allergen Reactions   ??? Valium [Diazepam] Other (See Comments)     Hallucinations, altered mental status  Hallucinations, altered mental status           No visits with results within 2 Week(s) from this visit.   Latest known visit with results is:   Research Encounter on 01/18/2019   Component Date Value Ref Range Status   ??? Sodium 02/02/2019 138  135 - 145 mmol/L Final   ??? Potassium 02/02/2019 4.4  3.5 - 5.0 mmol/L Final   ??? Chloride 02/02/2019 105  98 - 107 mmol/L Final   ??? CO2 02/02/2019 25.0  22.0 - 30.0 mmol/L Final   ??? BUN 02/02/2019 15  7 - 21 mg/dL Final   ??? Creatinine 02/02/2019 0.97  0.70 - 1.30 mg/dL Final   ??? EGFR CKD-EPI Non-African American,* 02/02/2019 73  >=60 mL/min/1.60m2 Final   ??? EGFR CKD-EPI African American, Male 02/02/2019 84  >=60 mL/min/1.52m2 Final   ??? Glucose 02/02/2019 106  70 - 179 mg/dL Final   ??? Calcium 16/06/9603 9.5  8.5 - 10.2 mg/dL Final   ??? Magnesium 54/05/8118 2.0  1.6 - 2.2 mg/dL Final   ??? Phosphorus 02/02/2019 3.8  2.9 - 4.7 mg/dL Final   ??? Albumin 14/78/2956 3.6  3.5 - 5.0 g/dL Final   ??? Total Bilirubin 02/02/2019 0.2  0.0 - 1.2 mg/dL Final   ??? ALT 21/30/8657 11  <50 U/L Final   ??? AST 02/02/2019 20  19 - 55 U/L Final   ??? Alkaline Phosphatase 02/02/2019 65  38 - 126 U/L Final   ??? LDH 02/02/2019 414  338 - 610 U/L Final   ??? Uric Acid 02/02/2019 4.3  4.0 - 9.0 mg/dL Final   ??? Creatine Kinase, Total 02/02/2019 44.0* 45.0 - 250.0 U/L Final   ??? Triglycerides 02/02/2019 100  1 - 149 mg/dL Final   ??? Cholesterol 02/02/2019 150  100 - 199 mg/dL Final   ??? HDL 84/69/6295 44  40 - 59 mg/dL Final   ??? LDL Calculated 02/02/2019 86  60 - 99 mg/dL Final    NHLBI Recommended Ranges, LDL Cholesterol, for Adults (20+yrs) (ATPIII), mg/dL  Optimal              <284  Near Optimal        100-129  Borderline High     130-159  High                160-189  Very High            >=190  NHLBI Recommended Ranges, LDL Cholesterol, for Children (2-19 yrs), mg/dL  Desirable            <132  Borderline High     110-129  High                 >=130     ??? VLDL Cholesterol Cal 02/02/2019 20  12 - 42 mg/dL Final   ??? Chol/HDL Ratio 02/02/2019 3.4  <4.4 Final   ??? Non-HDL Cholesterol 02/02/2019 106  mg/dL Final    Non-HDL Cholesterol Recommended Ranges (mg/dL)  Optimal       <010  Near Optimal 130 - 159  Borderline High 160 - 189  High  190 - 219  Very High       >220     ??? FASTING 02/02/2019 Unknown   Final   ??? PSA 02/02/2019 5.02* 0.00 - 4.00 ng/mL Final   ??? TSH 02/02/2019 19.380* 0.600 - 3.300 uIU/mL Final   ??? WBC 02/02/2019 6.2  4.5 - 11.0 10*9/L Final   ??? RBC 02/02/2019 3.60* 4.50 - 5.90 10*12/L Final   ??? HGB 02/02/2019 11.9* 13.5 - 17.5 g/dL Final   ??? HCT 54/05/8118 36.7* 41.0 - 53.0 % Final   ??? MCV 02/02/2019 101.9* 80.0 - 100.0 fL Final   ??? MCH 02/02/2019 32.9  26.0 - 34.0 pg Final   ??? MCHC 02/02/2019 32.3  31.0 - 37.0 g/dL Final   ??? RDW 14/78/2956 16.2* 12.0 - 15.0 % Final   ??? MPV 02/02/2019 8.3  7.0 - 10.0 fL Final   ??? Platelet 02/02/2019 459* 150 - 440 10*9/L Final   ??? Neutrophils % 02/02/2019 60.1  % Final   ??? Lymphocytes % 02/02/2019 25.5  % Final   ??? Monocytes % 02/02/2019 6.4  % Final   ??? Eosinophils % 02/02/2019 5.3  % Final   ??? Basophils % 02/02/2019 0.5  % Final   ??? Absolute Neutrophils 02/02/2019 3.7  2.0 - 7.5 10*9/L Final   ??? Absolute Lymphocytes 02/02/2019 1.6  1.5 - 5.0 10*9/L Final   ??? Absolute Monocytes 02/02/2019 0.4  0.2 - 0.8 10*9/L Final   ??? Absolute Eosinophils 02/02/2019 0.3  0.0 - 0.4 10*9/L Final   ??? Absolute Basophils 02/02/2019 0.0  0.0 - 0.1 10*9/L Final   ??? Large Unstained Cells 02/02/2019 2  0 - 4 % Final   ??? Macrocytosis 02/02/2019 Marked* Not Present Final   ??? Anisocytosis 02/02/2019 Slight* Not Present Final   ??? Hypochromasia 02/02/2019 Slight* Not Present Final   ??? Smear Review Comments 02/02/2019 See Comment* Undefined Final    Slide reviewed     ??? Free T4 02/02/2019 1.26  0.71 - 1.40 ng/dL Final       Lab Results   Component Value Date    PSA 5.02 (H) 02/02/2019    PSA 6.15 (H) 01/05/2019    PSA 6.53 (H) 12/09/2018    PSA 6.63 (H) 11/11/2018    PSA 20.10 (H) 10/14/2018    PSA 19.80 (H) 09/29/2018

## 2019-03-03 NOTE — Unmapped (Signed)
Hi,     John Watkins contacted the Communication Center requesting to speak with the care team of John Watkins to discuss:    Patients spouse called to let John Watkins know that patient had lab work done.    Please contact John Watkins at [phone].        Check Indicates criteria has been reviewed and confirmed with the patient:    [x]  Preferred Name   [x]  DOB and/or MR#  [x]  Preferred Contact Method  [x]  Phone Number(s)   []  MyChart     Thank you,   Jacques Navy  [facility]   [phone]

## 2019-03-04 ENCOUNTER — Encounter: Admit: 2019-03-04 | Discharge: 2019-03-04 | Payer: MEDICARE | Attending: Medical Oncology | Primary: Medical Oncology

## 2019-03-04 ENCOUNTER — Ambulatory Visit: Admit: 2019-03-04 | Discharge: 2019-03-04 | Payer: MEDICARE

## 2019-03-04 DIAGNOSIS — R946 Abnormal results of thyroid function studies: Secondary | ICD-10-CM

## 2019-03-04 DIAGNOSIS — R799 Abnormal finding of blood chemistry, unspecified: Secondary | ICD-10-CM

## 2019-03-04 DIAGNOSIS — C61 Malignant neoplasm of prostate: Principal | ICD-10-CM

## 2019-03-04 DIAGNOSIS — C775 Secondary and unspecified malignant neoplasm of intrapelvic lymph nodes: Secondary | ICD-10-CM

## 2019-03-04 DIAGNOSIS — R791 Abnormal coagulation profile: Secondary | ICD-10-CM

## 2019-03-04 LAB — PHOSPHORUS
PHOSPHORUS: 3.4 mg/dL (ref 2.9–4.7)
Phosphate:MCnc:Pt:Ser/Plas:Qn:: 3.4

## 2019-03-04 LAB — COMPREHENSIVE METABOLIC PANEL
ALBUMIN: 3.9 g/dL (ref 3.5–5.0)
ALT (SGPT): 12 U/L (ref <50)
ANION GAP: 8 mmol/L (ref 7–15)
AST (SGOT): 21 U/L (ref 19–55)
BILIRUBIN TOTAL: 0.3 mg/dL (ref 0.0–1.2)
BUN / CREAT RATIO: 15
CALCIUM: 9.5 mg/dL (ref 8.5–10.2)
CHLORIDE: 108 mmol/L — ABNORMAL HIGH (ref 98–107)
CO2: 25 mmol/L (ref 22.0–30.0)
CREATININE: 0.91 mg/dL (ref 0.70–1.30)
EGFR CKD-EPI AA MALE: >90 mL/min/{1.73_m2} (ref >=60)
EGFR CKD-EPI NON-AA MALE: 79 mL/min/{1.73_m2} (ref >=60)
GLUCOSE RANDOM: 122 mg/dL (ref 70–179)
POTASSIUM: 4.2 mmol/L (ref 3.5–5.0)
PROTEIN TOTAL: 6.8 g/dL (ref 6.5–8.3)
SODIUM: 141 mmol/L (ref 135–145)

## 2019-03-04 LAB — CBC W/ AUTO DIFF
BASOPHILS ABSOLUTE COUNT: 0 10*9/L (ref 0.0–0.1)
EOSINOPHILS ABSOLUTE COUNT: 0.4 10*9/L (ref 0.0–0.4)
EOSINOPHILS RELATIVE PERCENT: 4.4 %
HEMATOCRIT: 41.3 % (ref 41.0–53.0)
HEMOGLOBIN: 13.4 g/dL — ABNORMAL LOW (ref 13.5–17.5)
LYMPHOCYTES ABSOLUTE COUNT: 2.6 10*9/L (ref 1.5–5.0)
LYMPHOCYTES RELATIVE PERCENT: 31.3 %
MEAN CORPUSCULAR HEMOGLOBIN CONC: 32.5 g/dL (ref 31.0–37.0)
MEAN CORPUSCULAR HEMOGLOBIN: 32.5 pg (ref 26.0–34.0)
MEAN CORPUSCULAR VOLUME: 100.2 fL — ABNORMAL HIGH (ref 80.0–100.0)
MEAN PLATELET VOLUME: 6.8 fL — ABNORMAL LOW (ref 7.0–10.0)
MONOCYTES ABSOLUTE COUNT: 0.4 10*9/L (ref 0.2–0.8)
MONOCYTES RELATIVE PERCENT: 4.8 %
NEUTROPHILS ABSOLUTE COUNT: 4.7 10*9/L (ref 2.0–7.5)
NEUTROPHILS RELATIVE PERCENT: 56.2 %
PLATELET COUNT: 448 10*9/L — ABNORMAL HIGH (ref 150–440)
RED BLOOD CELL COUNT: 4.12 10*12/L — ABNORMAL LOW (ref 4.50–5.90)
WBC ADJUSTED: 8.4 10*9/L (ref 4.5–11.0)

## 2019-03-04 LAB — URIC ACID: Urate:MCnc:Pt:Ser/Plas:Qn:: 4.7

## 2019-03-04 LAB — WBC ADJUSTED: Lab: 8.4

## 2019-03-04 LAB — LIPID PANEL
CHOLESTEROL: 149 mg/dL (ref 100–199)
HDL CHOLESTEROL: 41 mg/dL (ref 40–59)
LDL CHOLESTEROL CALCULATED: 73 mg/dL
NON-HDL CHOLESTEROL: 108 mg/dL
VLDL CHOLESTEROL CAL: 35 mg/dL

## 2019-03-04 LAB — CREATINE KINASE TOTAL: Creatine kinase:CCnc:Pt:Ser/Plas:Qn:: 38 — ABNORMAL LOW

## 2019-03-04 LAB — LDL CHOLESTEROL CALCULATED: Cholesterol.in LDL:MCnc:Pt:Ser/Plas:Qn:Calculated: 73

## 2019-03-04 LAB — MAGNESIUM: Magnesium:MCnc:Pt:Ser/Plas:Qn:: 2

## 2019-03-04 LAB — TESTOSTERONE TOTAL: Testosterone:MCnc:Pt:Ser/Plas:Qn:: 11 — ABNORMAL LOW

## 2019-03-04 LAB — PROSTATE SPECIFIC ANTIGEN: Prostate specific Ag:MCnc:Pt:Ser/Plas:Qn:: 5.21 — ABNORMAL HIGH

## 2019-03-04 LAB — LACTATE DEHYDROGENASE: Lactate dehydrogenase:CCnc:Pt:Ser/Plas:Qn:: 420

## 2019-03-04 LAB — T4 TOTAL: Thyroxine:MCnc:Pt:Ser/Plas:Qn:: 6.76

## 2019-03-04 LAB — ANION GAP: Anion gap 3:SCnc:Pt:Ser/Plas:Qn:: 8

## 2019-03-04 NOTE — Unmapped (Signed)
[facility] Refill Coordination Note    Specialty Medication(s) to be Shipped:   Hematology/Oncology: [name]    Other medication(s) to be shipped: none     Gery Sabedra, DOB: [birth year]  Phone: [phone] (home)       All above HIPAA information was verified with patient.     Completed refill call assessment today to schedule patient's medication shipment from the [facility] [phone]).       Specialty medication(s) and dose(s) confirmed: Regimen is correct and unchanged.   Changes to medications: Ramesses reports no changes at this time.  Changes to insurance: No  Questions for the pharmacist: No    Confirmed patient received Welcome Packet with first shipment. The patient will receive a drug information handout for each medication shipped and additional FDA Medication Guides as required.       DISEASE/MEDICATION-SPECIFIC INFORMATION        N/A    SPECIALTY MEDICATION ADHERENCE     Medication Adherence    Patient reported X missed doses in the last month:  0  Specialty Medication:  xtandi 40mg   Patient is on additional specialty medications:  No  Adherence tools used:  medication list   Other adherence tool:  routine   Support network for adherence:  family member                Xtandi 40 mg: 10 days of medicine on hand          SHIPPING     Shipping address confirmed in Epic.     Delivery Scheduled: Yes, Expected medication delivery date: 03/09/19.     Medication will be delivered via Same Day Courier to the home address in Epic WAM.    Unk Lightning   [facility] Specialty Technician

## 2019-03-09 MED FILL — XTANDI 40 MG CAPSULE: 30 days supply | Qty: 120 | Fill #5 | Status: AC

## 2019-03-09 MED FILL — XTANDI 40 MG CAPSULE: ORAL | 30 days supply | Qty: 120 | Fill #5

## 2019-03-11 NOTE — Unmapped (Signed)
Referral to endocrinology placed. Sent message to trial coordinator to follow up.

## 2019-03-15 NOTE — Unmapped (Signed)
Necessary alterations to study procedures being made to minimize their risk of exposure to COVID-19. The patient was informed of all changes to study procedures, including reasonable alternatives. The patient will continue to be notified of new information as it becomes available.     Clinical Study: CPI-1205-201: A Phase 1b/2 Study of CPI-1205, a Small Molecule Inhibitor of EZH2, Combined with Enzalutamide or Abiraterone/Prednisone in Patients with Metastatic Castration Resistant Prostate Cancer     Study Treatment: Randomized Arm Phase 2 (Enzalutamide ??? Control Arm)     Date of Service: 11JUN2020     Cycle / Day: C6D1     Performance Status: ECOG PS: 0     Narrative: Patient had phone visit with Frederic Jericho, MD on 11JUN2020 for C6D1 of CPI-1205 protocol therapy. No physical exam was performed due to COIVD-19 precautions.  Labs drawn prior to clinic visit currently Gr 1 Hypothyroidism, will continue to monitor. Patient pill diary for C3 reviewed patient don 03JUN2020 due to missing dates, per patient and pill count verified patient is 88% complaint with only days actually missed was due to hospitalization. Will hold Lupron for now to reduce exposure. Complaints of mild to moderate fatigue, not interfering with ADL???s. Patient randomized to control arm on 21JAN2020.      Plan: Patient will be contacted in 4 weeks for C6D1 on 11JUN2020. Scans scheduled for 06JUL2020. Patient and wife extensively educated on side effects, dosing schedule, medication diary and both verbalized understanding. Contact information for study coordinator and Dr. Vernell Barrier provided and patient agrees to contact office with any questions or concerns.       Enzalutamide Med Compliance Log  Cycle  # Pills Dispensed  # Pills  Returned  # Pills  Dosed # Pills in Planned Dose % Dose Taken  Comments   Cycle 1 120 12 108 108 100    Cycle 2 120 12 108 108 100    Cycle 3 120 24 96 108 88 3 days in hospital ??? Patient error in completing diary   Cycle 4 120 12 108 108 100    Cycle 5 120 12 108 108 100    Cycle 6 120 12 108 108 100      Adverse Events Log:     Diagnosis Start Date   Stop Date Grade   Attribution  1: Unrelated 2: Unlikely 3: Possible 4: Probable 5: Definite 6: Unknown CS/NCS       Enzalutamide Prednisone    Hypothyroidism 19MAR2020 Ongoing 1      Fatigue 11MAR2020 Ongoing 1      Anemia 27MAR2020 Ongoing 1      Hypocalcemia 29MAR2020 15APR2020 1      Hypertriglyceridemia 15APR2020 13MAY2020 1      Hypertriglyceridemia 12JUN2020 Ongoing 1      Muscle Weakness 12JUN2020 Ongoing 1          Concomitant Medication Log: New medication highlighted in yellow.      Medication Dose/  Route Start Date Stop Date Indication Related to AE?  (yes/no, specify if yes)   Tamsulosin   0.4mg  PO QD 10JAN2020 Ongoing  Nocturia  Med Hx   Alendronate   70mg  PO Q Week 25JUL2019 Ongoing Health Maintenance Med Hx   Aspirin 81mg  PO  QD 30JAN2014 Ongoing Cardiac Prophylaxis Med Hx   Acetaminophen 500mg  PO Q6o PRN [id number] Ongoing Aches/Pains Med Hx   Amlodipine-Benazepril 5/20mg  PO QD 09MAR2018 16JAN2020 Hypertension Med Hx   Escitalopram Oxalate 10mg  PO BID 01FEB2019 Ongoing  Anxiety/Depression Med  Hx   Vitamin C   250mg  PO  3 tabs Q8o 01MAR2018 Ongoing General Health Med Hx   Calcium Carbonate 600mg  PO QD 24NOV2019 Ongoing Health Maintenance Med Hx   Prednisone 10mg  PO QD [id number] 07/2018 Prostate CA Med Hx   Abiraterone 250mg  PO 4 tab QD [id number] 07/2018 Prostate CA Med Hx   Multivitamin 1 Tab PO   QD 04FEB2015 Ongoing General Health Med Hx   Leuprolide 45mg  IM 04FEB2015 Ongoing Prostate CA Med Hx   Benazepril HCL 20mg  PO QD 17JAN2020 Ongoing Hypertension Med Hx   Levothyroxine PO QD 19MAR2020 16APR2020 Hypothyroidism A/E   Levothyroxine PO QD 17APR2020 Ongoing Hypothyroidism AE      Medical History  Diagnosis Start Date   Stop Date Grade   Clinically significant (yes/no)   Inguinal Hernia [id number] 2 No   Allergic Rhinitis 1995 1995 2 No   Bilateral Lower Extremity Edema 16DEC2019 Ongoing 1 No   Hypertension 03MAR2013 Ongoing 2 No   Nocutria [id number] Ongoing 1 No   Fatigue unAPR2013 Ongoing 1 No   Urinary Urgency [id number] Ongoing 1 No   Urinary Retention [id number] Ongoing 1 No   Depression 01FEB2019 Ongoing 2 No   Aches and Pains [id number] Ongoing 2 No   Anxiety 01FEB2019 Ongoing 2 No         Ezzard Flax, LPNII, CCRP  Study Coordinator  Pgr: [phone]

## 2019-03-18 ENCOUNTER — Other Ambulatory Visit: Payer: Self-pay | Admitting: Family Medicine

## 2019-03-18 DIAGNOSIS — I10 Essential (primary) hypertension: Secondary | ICD-10-CM

## 2019-03-19 ENCOUNTER — Other Ambulatory Visit: Payer: Self-pay | Admitting: Family Medicine

## 2019-03-19 DIAGNOSIS — I10 Essential (primary) hypertension: Secondary | ICD-10-CM

## 2019-03-28 ENCOUNTER — Ambulatory Visit: Admit: 2019-03-28 | Discharge: 2019-03-31 | Payer: MEDICARE

## 2019-03-28 ENCOUNTER — Ambulatory Visit: Admit: 2019-03-28 | Discharge: 2019-03-29 | Payer: MEDICARE

## 2019-03-31 NOTE — Unmapped (Signed)
Medical Oncology (Prostate Cancer)    I spent 20 minutes on the audio/video with the patient. I spent an additional 20 minutes on pre- and post-visit activities.     The patient was physically located in [place] or a state in which I am permitted to provide care. The patient and/or parent/gauardian understood that s/he may incur co-pays and cost sharing, and agreed to the telemedicine visit. The visit was completed via phone and/or video, which was appropriate and reasonable under the circumstances given the patient's presentation at the time.    The patient and/or parent/guardian has been advised of the potential risks and limitations of this mode of treatment (including, but not limited to, the absence of in-person examination) and has agreed to be treated using telemedicine. The patient's/patient's family's questions regarding telemedicine have been answered.     If the phone/video visit was completed in an ambulatory setting, the patient and/or parent/guardian has also been advised to contact their provider???s office for worsening conditions, and seek emergency medical treatment and/or call 911 if the patient deems either necessary.    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 He has been asymptomatic.  He has had RT to the prostate in the past.  He is on clinical trial CPI-1201-201 (ProStar) on the enzalutamide monotherapy arm.  His course was complicated by admission with a GI bleed from diverticulum which was (likely) unrelated to study treatment.      Restaging on 02/02/19 with bone scan and CT CAP showed stable lymphadenopathy and no bone metastases.  PSA decreased with initiation of trial and has remained stable.  Overall these findings suggested stable disease on trial.      No new AEs attributable to study drug.  He is feeling well but has ongoing stable feeling of fatigue and weakness particularly in legs.  PSA remains stable.    Plan:  - Continue Eligard 45mg  every 6 months (last received 02/02/19).  - Continue CPI-1201-201 (ProStar) trial, enzalutamide monotherapy arm.    - Hypothyroid: Seeing Endocrinology.  - I previously asked Radiation Oncology to weigh in about potential RT to the lymph nodes, and they felt this would not be beneficial and would confer toxicity without likely long-term disease control.    - Continue BP meds (started previously w abiraterone).  Continue blood pressure control with his PCP.  - Clinical genetics saw him, negative genomic screening.  - Nocturia: persistent, does not want Flomax.   - Anxiety: On Lexapro - Pharmacy ran interactions for both olaparib and abiraterone.  - Smoking: He understands risks, and the  smoking cessation program here has been meeting with him for counseling.  - Bone density: Checked again in 6/19 and although only mildly low, had 10% loss in bone density from 2016-2019 on Lupron and now prednisone.  On Fosamax, calcium/D.    He is DNR/DNI.  His proxy is his wife.    Follow up per trial. Patent consented to virtual encounters.      -------------------------------------------------------    Referring physician:  Riki Altes, MD [facility])  Other physician:  Irven Easterly, MD    CC: Prostate cancer to lymph nodes and possibly bone s/p IMRT to prostate.    HPI:  2010: IMRT to prostate for T1c prostate adenocarcinoma, biopsy with up to Gleason 4+3=7.  Subsequent PSA rise with negative saturation biopsy on 11/17/12.  - 04/05/13: 8.3  - 08/29/13: 12.8  -  02/09/14: 13.3  10/2013: MRI with enlarged left obturator, iliac and para-aortic nodes up to 3 cm consistent with metastatic disease. Bilateral ischia and L pelvis with bony areas <81mm of unclear etiology (bone islands vs. metastases).  10/2013: Lupron started  Subsequent PSA:  - 05/01/14: 1.4  - 08/02/14: 1.4  - 11/03/14: 1.7  01/18/16: Bone scan: no metastases.  01/18/16: CT A/P: 2.6 cm left external iliac chain hyperenhancing node (series 6, image 57). Several prominent subcentimeter lymph nodes are seen along the left para-aortic retroperitoneum.  Prostate is enlarged measuring 6.0 x 6.7 x 9.3 cm. [Possible lipoma in gluteus maximus, or could be related to Lupron injection, discussed w urology, no action warranted.]  10/16/16: restaging:   - CT CAP: Interval increase in size of left external and common iliac chain as well as periaortic lymph nodes, worrisome for metastatic disease.   - Bone Scan: No evidence of osseous metastatic disease.  5/18: Abiraterone/prednisone  6/19: BMD (c/w 2016): Lumbar spine: ??Normal bone density. ??The measurement has decreased significantly since prior study.  Left proximal femur: Mildly low bone density. ??The measurement has decreased significantly since prior study.  04/15/18: PSA 10.6  08/04/18: PSA rising on abiraterone, now 16.7  08/04/18: Bone scan: No evidence of osseous metastasis.  08/04/18: CT CAP: Interval increase in external iliac, periaortic, and aortocaval lymphadenopathy concerning for disease progression.  1/20: Start enzalutamide on trial.  02/02/19: Bone scan restaging: No new abnormal foci of uptake to suggest osseous metastatic disease.  02/02/19: CT CAP: -Multistation abdominopelvic lymphadenopathy, overall stable to slightly decreased in size when compared to CT from 12/08/2018 with the exception of a few bilateral inguinal lymph nodes which are minimally larger in size. -Otherwise, no new sites of metastatic disease in the abdomen or pelvis. -Additional chronic/incidental findings as above, unchanged.    ROS: Nocturia stable at every 2-3 hours, doesn't bother him.  No pain.  Good appetite.  Has tolerated Lupron well.  Remainder of 10 system ROS negative.  Ongoing feeling of fatigue and weakness particularly in legs.    PE:  ECOG 0  There were no vitals taken for this visit.      Current Outpatient Medications   Medication Sig Dispense Refill   ??? alendronate (FOSAMAX) 70 MG tablet Take 1 tablet (70 mg total) by mouth every seven (7) days. (Patient taking differently: Take 70 mg by mouth Every Monday. ) 12.85 tablet 3   ??? ascorbic acid, vitamin C, (VITAMIN C) 250 MG tablet Take 250 mg by mouth.     ??? benazepriL (LOTENSIN) 20 MG tablet Take 10 mg by mouth daily. Pt said he was advised to split pills and take 10 mg per day      ??? calcium carbonate/vitamin D3 (CALCIUM 600 + D,3, ORAL) Take 1 capsule by mouth daily.     ??? enzalutamide (XTANDI) 40 mg capsule Take 4 capsules (160 mg total) by mouth daily. 120 capsule 6   ??? escitalopram oxalate (LEXAPRO) 10 MG tablet Take 1 tablet (10 mg total) by mouth daily. 90 tablet 1   ??? hydroCHLOROthiazide (HYDRODIURIL) 12.5 MG tablet Take 6.25 mg by mouth daily.     ??? leuprolide (LUPRON) 22.5 mg injection Inject 22.5 mg into the muscle Every three (3) months.     ??? levothyroxine (SYNTHROID) 75 MCG tablet Take 1 tablet (75 mcg total) by mouth daily. 30 tablet 7   ??? multivitamin (THERAGRAN) per tablet Take 1 tablet by mouth daily.     ??? tamsulosin (  FLOMAX) 0.4 mg capsule Take 1 capsule (0.4 mg total) by mouth daily. 90 capsule 3     No current facility-administered medications for this visit.      Allergies   Allergen Reactions   ??? Valium [Diazepam] Other (See Comments)     Hallucinations, altered mental status  Hallucinations, altered mental status           No visits with results within 2 Week(s) from this visit.   Latest known visit with results is:   Appointment on 03/03/2019   Component Date Value Ref Range Status   ??? TSH 03/03/2019 14.100* 0.600 - 3.300 uIU/mL Final   ??? TSH 03/03/2019 14.610* 0.600 - 3.300 uIU/mL Final   ??? Free T4 03/03/2019 1.26  0.71 - 1.40 ng/dL Final       Lab Results   Component Value Date    PSA 5.21 (H) 03/04/2019    PSA 5.02 (H) 02/02/2019    PSA 6.15 (H) 01/05/2019    PSA 6.53 (H) 12/09/2018    PSA 6.63 (H) 11/11/2018    PSA 20.10 (H) 10/14/2018

## 2019-03-31 NOTE — Unmapped (Signed)
Nice speaking with you today.   If you have any questions please call the Nurse Navigator in the office at [phone], or [phone]) [phone].  Frederic Jericho, MD

## 2019-04-01 ENCOUNTER — Ambulatory Visit: Admit: 2019-04-01 | Discharge: 2019-04-02 | Payer: MEDICARE

## 2019-04-01 ENCOUNTER — Encounter: Admit: 2019-04-01 | Discharge: 2019-04-02 | Payer: MEDICARE | Attending: Medical Oncology | Primary: Medical Oncology

## 2019-04-01 DIAGNOSIS — R946 Abnormal results of thyroid function studies: Secondary | ICD-10-CM

## 2019-04-01 DIAGNOSIS — C775 Secondary and unspecified malignant neoplasm of intrapelvic lymph nodes: Secondary | ICD-10-CM

## 2019-04-01 DIAGNOSIS — R799 Abnormal finding of blood chemistry, unspecified: Principal | ICD-10-CM

## 2019-04-01 DIAGNOSIS — C61 Malignant neoplasm of prostate: Secondary | ICD-10-CM

## 2019-04-01 DIAGNOSIS — R791 Abnormal coagulation profile: Secondary | ICD-10-CM

## 2019-04-01 LAB — ALKALINE PHOSPHATASE
Alkaline phosphatase:CCnc:Pt:Ser/Plas:Qn:: 75
Alkaline phosphatase:CCnc:Pt:Ser/Plas:Qn:: 76

## 2019-04-01 LAB — CBC W/ AUTO DIFF
BASOPHILS ABSOLUTE COUNT: 0 10*9/L (ref 0.0–0.1)
BASOPHILS RELATIVE PERCENT: 0.6 %
EOSINOPHILS ABSOLUTE COUNT: 0.3 10*9/L (ref 0.0–0.4)
EOSINOPHILS RELATIVE PERCENT: 5.4 %
HEMATOCRIT: 40.2 % — ABNORMAL LOW (ref 41.0–53.0)
HEMOGLOBIN: 13.3 g/dL — ABNORMAL LOW (ref 13.5–17.5)
LARGE UNSTAINED CELLS: 4 % (ref 0–4)
MEAN CORPUSCULAR HEMOGLOBIN CONC: 33 g/dL (ref 31.0–37.0)
MEAN CORPUSCULAR HEMOGLOBIN: 33 pg (ref 26.0–34.0)
MEAN CORPUSCULAR VOLUME: 100.1 fL — ABNORMAL HIGH (ref 80.0–100.0)
MEAN PLATELET VOLUME: 6.9 fL — ABNORMAL LOW (ref 7.0–10.0)
MONOCYTES ABSOLUTE COUNT: 0.5 10*9/L (ref 0.2–0.8)
MONOCYTES RELATIVE PERCENT: 7.3 %
NEUTROPHILS ABSOLUTE COUNT: 2.7 10*9/L (ref 2.0–7.5)
NEUTROPHILS RELATIVE PERCENT: 43.1 %
PLATELET COUNT: 383 10*9/L (ref 150–440)
RED BLOOD CELL COUNT: 4.01 10*12/L — ABNORMAL LOW (ref 4.50–5.90)
RED CELL DISTRIBUTION WIDTH: 16.4 % — ABNORMAL HIGH (ref 12.0–15.0)
WBC ADJUSTED: 6.2 10*9/L (ref 4.5–11.0)

## 2019-04-01 LAB — ALT (SGPT)
Alanine aminotransferase:CCnc:Pt:Ser/Plas:Qn:: 11
Alanine aminotransferase:CCnc:Pt:Ser/Plas:Qn:: 12

## 2019-04-01 LAB — CALCIUM
Calcium:MCnc:Pt:Ser/Plas:Qn:: 9.4
Calcium:MCnc:Pt:Ser/Plas:Qn:: 9.4

## 2019-04-01 LAB — LIPID PANEL
CHOLESTEROL/HDL RATIO SCREEN: 4.5
CHOLESTEROL: 158 mg/dL (ref 100–199)
HDL CHOLESTEROL: 35 mg/dL — ABNORMAL LOW (ref 40–59)
LDL CHOLESTEROL CALCULATED: 95 mg/dL
NON-HDL CHOLESTEROL: 123 mg/dL
TRIGLYCERIDES: 141 mg/dL (ref 1–149)
VLDL CHOLESTEROL CAL: 28.2 mg/dL

## 2019-04-01 LAB — MAGNESIUM: Magnesium:MCnc:Pt:Ser/Plas:Qn:: 2

## 2019-04-01 LAB — BILIRUBIN TOTAL
Bilirubin:MCnc:Pt:Ser/Plas:Qn:: 0.3
Bilirubin:MCnc:Pt:Ser/Plas:Qn:: 0.4

## 2019-04-01 LAB — GLUCOSE RANDOM
Glucose:MCnc:Pt:Ser/Plas:Qn:: 123
Glucose:MCnc:Pt:Ser/Plas:Qn:: 123

## 2019-04-01 LAB — BLOOD UREA NITROGEN
Urea nitrogen:MCnc:Pt:Ser/Plas:Qn:: 15
Urea nitrogen:MCnc:Pt:Ser/Plas:Qn:: 15

## 2019-04-01 LAB — CREATININE
Creatinine:MCnc:Pt:Ser/Plas:Qn:: 0.92
EGFR CKD-EPI NON-AA MALE: 78 mL/min/{1.73_m2} (ref >=60)

## 2019-04-01 LAB — T4 TOTAL: Thyroxine:MCnc:Pt:Ser/Plas:Qn:: 6.63

## 2019-04-01 LAB — LACTATE DEHYDROGENASE: Lactate dehydrogenase:CCnc:Pt:Ser/Plas:Qn:: 380

## 2019-04-01 LAB — AST (SGOT)
Aspartate aminotransferase:CCnc:Pt:Ser/Plas:Qn:: 20
Aspartate aminotransferase:CCnc:Pt:Ser/Plas:Qn:: 20

## 2019-04-01 LAB — PSA: PROSTATE SPECIFIC ANTIGEN: 5.01 ng/mL — ABNORMAL HIGH (ref 0.00–4.00)

## 2019-04-01 LAB — POTASSIUM
Potassium:SCnc:Pt:Ser/Plas:Qn:: 4.2
Potassium:SCnc:Pt:Ser/Plas:Qn:: 4.2

## 2019-04-01 LAB — PROSTATE SPECIFIC ANTIGEN: Prostate specific Ag:MCnc:Pt:Ser/Plas:Qn:: 5.01 — ABNORMAL HIGH

## 2019-04-01 LAB — CHLORIDE
Chloride:SCnc:Pt:Ser/Plas:Qn:: 109 — ABNORMAL HIGH
Chloride:SCnc:Pt:Ser/Plas:Qn:: 109 — ABNORMAL HIGH

## 2019-04-01 LAB — URIC ACID: Urate:MCnc:Pt:Ser/Plas:Qn:: 5.4

## 2019-04-01 LAB — SODIUM
Sodium:SCnc:Pt:Ser/Plas:Qn:: 139
Sodium:SCnc:Pt:Ser/Plas:Qn:: 139

## 2019-04-01 LAB — CO2
Carbon dioxide:SCnc:Pt:Ser/Plas:Qn:: 23
Carbon dioxide:SCnc:Pt:Ser/Plas:Qn:: 24

## 2019-04-01 LAB — CHOLESTEROL/HDL RATIO SCREEN: Lab: 4.5

## 2019-04-01 LAB — PHOSPHORUS: Phosphate:MCnc:Pt:Ser/Plas:Qn:: 4.1

## 2019-04-01 LAB — EGFR CKD-EPI NON-AA MALE: Lab: 75

## 2019-04-01 LAB — CREATINE KINASE TOTAL: Creatine kinase:CCnc:Pt:Ser/Plas:Qn:: 52

## 2019-04-01 LAB — ALBUMIN
Albumin:MCnc:Pt:Ser/Plas:Qn:: 3.3 — ABNORMAL LOW
Albumin:MCnc:Pt:Ser/Plas:Qn:: 3.3 — ABNORMAL LOW

## 2019-04-01 LAB — THYROID STIMULATING HORMONE: Thyrotropin:ACnc:Pt:Ser/Plas:Qn:: 19.76 — ABNORMAL HIGH

## 2019-04-01 LAB — MEAN PLATELET VOLUME: Lab: 6.9 — ABNORMAL LOW

## 2019-04-01 LAB — AST: AST (SGOT): 20 U/L (ref 19–55)

## 2019-04-01 NOTE — Unmapped (Signed)
[facility] Refill Coordination Note    Specialty Medication(s) to be Shipped:   Hematology/Oncology: John Watkins John Watkins, DOB: [birth year]  Phone: [phone] (home)       All above HIPAA information was verified with patient.     Completed refill call assessment today to schedule patient's medication shipment from the [facility] [phone]).       Specialty medication(s) and dose(s) confirmed: Regimen is correct and unchanged.   Changes to medications: John Watkins reports no changes at this time.  Changes to insurance: No  Questions for the pharmacist: No    Confirmed patient received Welcome Packet with first shipment. The patient will receive a drug information handout for each medication shipped and additional FDA Medication Guides as required.       DISEASE/MEDICATION-SPECIFIC INFORMATION        N/A    SPECIALTY MEDICATION ADHERENCE     Medication Adherence    Patient reported X missed doses in the last month:  0  Specialty Medication:  Xtandi 40 mg  Patient is on additional specialty medications:  No  Patient is on more than two specialty medications:  No  Any gaps in refill history greater than 2 weeks in the last 3 months:  no  Demonstrates understanding of importance of adherence:  yes  Informant:  spouse  Reliability of informant:  reliable  Provider-estimated medication adherence level:  good  Adherence tools used:  medication list   Other adherence tool:  routine   Support network for adherence:  family member  Confirmed plan for next specialty medication refill:  delivery by pharmacy          Refill Coordination    Has the Patients' Contact Information Changed:  No  Is the Shipping Address Different:  No           Xtandi 40 mg  : 8 days of medicine on hand       SHIPPING     Shipping address confirmed in Epic.     Delivery Scheduled: Yes, Expected medication delivery date: 04/08/2019.     Medication will be delivered via Same Day Courier to the home address in Epic [place].    John Watkins   [facility] Specialty Technician

## 2019-04-07 ENCOUNTER — Ambulatory Visit: Admit: 2019-04-07 | Discharge: 2019-04-20 | Payer: MEDICARE

## 2019-04-07 ENCOUNTER — Ambulatory Visit: Admit: 2019-04-07 | Discharge: 2019-04-12 | Payer: MEDICARE

## 2019-04-07 ENCOUNTER — Ambulatory Visit: Admit: 2019-04-07 | Discharge: 2019-04-30 | Payer: MEDICARE

## 2019-04-07 DIAGNOSIS — C61 Malignant neoplasm of prostate: Principal | ICD-10-CM

## 2019-04-08 MED FILL — XTANDI 40 MG CAPSULE: 30 days supply | Qty: 120 | Fill #6 | Status: AC

## 2019-04-08 MED FILL — XTANDI 40 MG CAPSULE: ORAL | 30 days supply | Qty: 120 | Fill #6

## 2019-04-28 ENCOUNTER — Institutional Professional Consult (permissible substitution): Admit: 2019-04-28 | Discharge: 2019-04-29 | Payer: MEDICARE | Attending: Family | Primary: Family

## 2019-04-28 MED ORDER — LEVOTHYROXINE 88 MCG TABLET
ORAL_TABLET | Freq: Every day | ORAL | 11 refills | 30 days | Status: CP
Start: 2019-04-28 — End: 2020-04-27

## 2019-04-28 NOTE — Unmapped (Signed)
Plan  Continue Enza on trial  Increase Synthroid to - recheck TSH in 4-6 weeks  Next imaging scheduled for 05/24/2019

## 2019-04-28 NOTE — Unmapped (Signed)
Reviewed Meds and Allergies, confirmed pharmacy and informed how to use Doximity.

## 2019-04-28 NOTE — Unmapped (Signed)
Medical Oncology (Prostate Cancer)    I spent 22 minutes on the audio/video with the patient. I spent an additional 20 minutes on pre- and post-visit activities.     The patient was physically located in [place] or a state in which I am permitted to provide care. The patient and/or parent/gauardian understood that s/he may incur co-pays and cost sharing, and agreed to the telemedicine visit. The visit was completed via phone and/or video, which was appropriate and reasonable under the circumstances given the patient's presentation at the time.    The patient and/or parent/guardian has been advised of the potential risks and limitations of this mode of treatment (including, but not limited to, the absence of in-person examination) and has agreed to be treated using telemedicine. The patient's/patient's family's questions regarding telemedicine have been answered.     If the phone/video visit was completed in an ambulatory setting, the patient and/or parent/guardian has also been advised to contact their provider???s office for worsening conditions, and seek emergency medical treatment and/or call 911 if the patient deems either necessary.    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 He has been asymptomatic.  He has had RT to the prostate in the past.  He is on clinical trial CPI-1201-201 (ProStar) on the enzalutamide monotherapy arm.  His course was complicated by admission with a GI bleed from diverticulum which was (likely) unrelated to study treatment.      Restaging on 02/02/19 with bone scan and CT CAP showed stable lymphadenopathy and no bone metastases. PSA decreased with initiation of trial and has remained stable. Imaging reviewed from 7/16 with continued overall stability.    No new AEs attributable to study drug.  He is feeling well but has ongoing stable feeling of fatigue and weakness particularly in legs. He also reports some dizziness with position change- we reviewed moving from sitting to standing slowly and maintaining good hydration with water on these hot days. Home BP readings stable 120-130/70-80. PSA remains stable (5.01).     Plan:  - Continue Eligard 45mg  every 6 months (last received 02/02/19- due again 08/06/2019).  - Continue CPI-1201-201 (ProStar) trial, enzalutamide monotherapy arm.  - Hypothyroid: Seeing Endocrinology- increased Synthroid to today- will recheck TSH in 4-6 weeks   - I previously asked Radiation Oncology to weigh in about potential RT to the lymph nodes, and they felt this would not be beneficial and would confer toxicity without likely long-term disease control.    - Continue BP meds (started previously w/ abiraterone). Continue blood pressure control with his PCP.  - Clinical genetics saw him, negative genomic screening.  - Nocturia: on Flomax.   - Anxiety: On Lexapro - Pharmacy ran interactions for both olaparib and abiraterone.  - Smoking: He understands risks, and the [place] smoking cessation program here has been meeting with him for counseling.  - Bone density: Checked again in 6/19 and although only mildly low, had 10% loss in bone density from 2016-2019 on Lupron and now prednisone.  On Fosamax, calcium/D.    He is DNR/DNI. His proxy is his wife.    Follow up per trial. Patent consented to virtual encounters.      -------------------------------------------------------    Referring physician:  Riki Altes, MD [facility])  Other physician:  Irven Easterly, MD    CC: Prostate cancer to lymph nodes and possibly bone s/p IMRT to prostate.  HPI:  2010: IMRT to prostate for T1c prostate adenocarcinoma, biopsy with up to Gleason 4+3=7.  Subsequent PSA rise with negative saturation biopsy on 11/17/12.  - 04/05/13: 8.3  - 08/29/13: 12.8  - 02/09/14: 13.3  10/2013: MRI with enlarged left obturator, iliac and para-aortic nodes up to 3 cm consistent with metastatic disease. Bilateral ischia and L pelvis with bony areas <56mm of unclear etiology (bone islands vs. metastases).  10/2013: Lupron started  Subsequent PSA:  - 05/01/14: 1.4  - 08/02/14: 1.4  - 11/03/14: 1.7  01/18/16: Bone scan: no metastases.  01/18/16: CT A/P: 2.6 cm left external iliac chain hyperenhancing node (series 6, image 57). Several prominent subcentimeter lymph nodes are seen along the left para-aortic retroperitoneum.  Prostate is enlarged measuring 6.0 x 6.7 x 9.3 cm. [Possible lipoma in gluteus maximus, or could be related to Lupron injection, discussed w urology, no action warranted.]  10/16/16: restaging:   - CT CAP: Interval increase in size of left external and common iliac chain as well as periaortic lymph nodes, worrisome for metastatic disease.   - Bone Scan: No evidence of osseous metastatic disease.  5/18: Abiraterone/prednisone  6/19: BMD (c/w 2016): Lumbar spine: ??Normal bone density. ??The measurement has decreased significantly since prior study.  Left proximal femur: Mildly low bone density. ??The measurement has decreased significantly since prior study.  04/15/18: PSA 10.6  08/04/18: PSA rising on abiraterone, now 16.7  08/04/18: Bone scan: No evidence of osseous metastasis.  08/04/18: CT CAP: Interval increase in external iliac, periaortic, and aortocaval lymphadenopathy concerning for disease progression.  1/20: Start enzalutamide on trial.  02/02/19: Bone scan restaging: No new abnormal foci of uptake to suggest osseous metastatic disease.  02/02/19: CT CAP: -Multistation abdominopelvic lymphadenopathy, overall stable to slightly decreased in size when compared to CT from 12/08/2018 with the exception of a few bilateral inguinal lymph nodes which are minimally larger in size. -Otherwise, no new sites of metastatic disease in the abdomen or pelvis. -Additional chronic/incidental findings as above, unchanged.    ROS: Nocturia stable at every 2-3 hours, doesn't bother him.  No pain.  Good appetite.  Has tolerated Lupron well.  Remainder of 10 system ROS negative.  Ongoing feeling of fatigue and weakness particularly in legs.    PE:  ECOG 0  Home BP reading this morning 123/81    Current Outpatient Medications   Medication Sig Dispense Refill   ??? ascorbic acid, vitamin C, (VITAMIN C) 250 MG tablet Take 250 mg by mouth.     ??? benazepriL (LOTENSIN) 20 MG tablet Take 10 mg by mouth daily. Pt said he was advised to split pills and take 10 mg per day      ??? calcium carbonate/vitamin D3 (CALCIUM 600 + D,3, ORAL) Take 1 capsule by mouth daily.     ??? enzalutamide (XTANDI) 40 mg capsule Take 4 capsules (160 mg total) by mouth daily. 120 capsule 6   ??? escitalopram oxalate (LEXAPRO) 10 MG tablet Take 1 tablet (10 mg total) by mouth daily. 90 tablet 1   ??? hydroCHLOROthiazide (HYDRODIURIL) 12.5 MG tablet Take 6.25 mg by mouth daily.     ??? leuprolide (LUPRON) 22.5 mg injection Inject 22.5 mg into the muscle Every three (3) months.     ??? multivitamin (THERAGRAN) per tablet Take 1 tablet by mouth daily.     ??? tamsulosin (FLOMAX) 0.4 mg capsule Take 1 capsule (0.4 mg total) by mouth daily. 90 capsule 3   ??? alendronate (FOSAMAX) 70  MG tablet Take 1 tablet (70 mg total) by mouth every seven (7) days. (Patient taking differently: Take 70 mg by mouth Every Monday. ) 12.85 tablet 3   ??? levothyroxine (SYNTHROID) 88 MCG tablet Take 1 tablet (88 mcg total) by mouth daily. 30 tablet 11     No current facility-administered medications for this visit.      Allergies   Allergen Reactions   ??? Valium [Diazepam] Other (See Comments)     Hallucinations, altered mental status  Hallucinations, altered mental status           No visits with results within 2 Week(s) from this visit.   Latest known visit with results is:   Research Encounter on 03/31/2019   Component Date Value Ref Range Status   ??? Sodium 04/01/2019 139  135 - 145 mmol/L Final   ??? Potassium 04/01/2019 4.2  3.5 - 5.0 mmol/L Final   ??? Chloride 04/01/2019 109* 98 - 107 mmol/L Final   ??? CO2 04/01/2019 23.0  22.0 - 30.0 mmol/L Final   ??? BUN 04/01/2019 15  7 - 21 mg/dL Final   ??? Creatinine 04/01/2019 0.95  0.70 - 1.30 mg/dL Final   ??? EGFR CKD-EPI Non-African American,* 04/01/2019 75  >=60 mL/min/1.38m2 Final   ??? EGFR CKD-EPI African American, Male 04/01/2019 86  >=60 mL/min/1.80m2 Final   ??? Glucose 04/01/2019 123  70 - 179 mg/dL Final   ??? Calcium 16/06/9603 9.4  8.5 - 10.2 mg/dL Final   ??? Magnesium 54/05/8118 2.0  1.6 - 2.2 mg/dL Final   ??? Phosphorus 04/01/2019 4.1  2.9 - 4.7 mg/dL Final   ??? Albumin 14/78/2956 3.3* 3.5 - 5.0 g/dL Final   ??? Total Bilirubin 04/01/2019 0.3  0.0 - 1.2 mg/dL Final   ??? ALT 21/30/8657 12  <50 U/L Final   ??? AST 04/01/2019 20  19 - 55 U/L Final   ??? Alkaline Phosphatase 04/01/2019 76  38 - 126 U/L Final   ??? LDH 04/01/2019 380  338 - 610 U/L Final   ??? Uric Acid 04/01/2019 5.4  4.0 - 9.0 mg/dL Final   ??? Creatine Kinase, Total 04/01/2019 52.0  45.0 - 250.0 U/L Final   ??? Triglycerides 04/01/2019 141  1 - 149 mg/dL Final   ??? Cholesterol 04/01/2019 158  100 - 199 mg/dL Final   ??? HDL 84/69/6295 35* 40 - 59 mg/dL Final   ??? LDL Calculated 04/01/2019 95  mg/dL Final   ??? VLDL Cholesterol Cal 04/01/2019 28.2  mg/dL Final   ??? Chol/HDL Ratio 04/01/2019 4.5   Final   ??? Non-HDL Cholesterol 04/01/2019 123  mg/dL Final   ??? FASTING 28/41/3244 Yes   Final   ??? PSA 04/01/2019 5.01* 0.00 - 4.00 ng/mL Final   ??? TSH 04/01/2019 19.760* 0.600 - 3.300 uIU/mL Final   ??? T4, Total 04/01/2019 6.63  5.50 - 11.00 ug/dL Final   ??? WBC 09/24/7251 6.2  4.5 - 11.0 10*9/L Final   ??? RBC 04/01/2019 4.01* 4.50 - 5.90 10*12/L Final   ??? HGB 04/01/2019 13.3* 13.5 - 17.5 g/dL Final   ??? HCT 66/44/0347 40.2* 41.0 - 53.0 % Final   ??? MCV 04/01/2019 100.1* 80.0 - 100.0 fL Final   ??? MCH 04/01/2019 33.0  26.0 - 34.0 pg Final   ??? MCHC 04/01/2019 33.0  31.0 - 37.0 g/dL Final   ??? RDW 42/59/5638 16.4* 12.0 - 15.0 % Final   ??? MPV 04/01/2019 6.9* 7.0 - 10.0 fL Final   ???  Platelet 04/01/2019 383  150 - 440 10*9/L Final ??? Neutrophils % 04/01/2019 43.1  % Final   ??? Lymphocytes % 04/01/2019 40.0  % Final   ??? Monocytes % 04/01/2019 7.3  % Final   ??? Eosinophils % 04/01/2019 5.4  % Final   ??? Basophils % 04/01/2019 0.6  % Final   ??? Absolute Neutrophils 04/01/2019 2.7  2.0 - 7.5 10*9/L Final   ??? Absolute Lymphocytes 04/01/2019 2.5  1.5 - 5.0 10*9/L Final   ??? Absolute Monocytes 04/01/2019 0.5  0.2 - 0.8 10*9/L Final   ??? Absolute Eosinophils 04/01/2019 0.3  0.0 - 0.4 10*9/L Final   ??? Absolute Basophils 04/01/2019 0.0  0.0 - 0.1 10*9/L Final   ??? Large Unstained Cells 04/01/2019 4  0 - 4 % Final   ??? Macrocytosis 04/01/2019 Slight* Not Present Final   ??? Anisocytosis 04/01/2019 Slight* Not Present Final       Lab Results   Component Value Date    PSA 5.01 (H) 04/01/2019    PSA 5.21 (H) 03/04/2019    PSA 5.02 (H) 02/02/2019    PSA 6.15 (H) 01/05/2019    PSA 6.53 (H) 12/09/2018    PSA 6.63 (H) 11/11/2018

## 2019-04-29 ENCOUNTER — Encounter: Admit: 2019-04-29 | Discharge: 2019-04-29 | Payer: MEDICARE | Attending: Medical Oncology | Primary: Medical Oncology

## 2019-04-29 ENCOUNTER — Ambulatory Visit: Admit: 2019-04-29 | Discharge: 2019-04-29 | Payer: MEDICARE

## 2019-04-29 DIAGNOSIS — R799 Abnormal finding of blood chemistry, unspecified: Principal | ICD-10-CM

## 2019-04-29 DIAGNOSIS — C775 Secondary and unspecified malignant neoplasm of intrapelvic lymph nodes: Secondary | ICD-10-CM

## 2019-04-29 DIAGNOSIS — R791 Abnormal coagulation profile: Secondary | ICD-10-CM

## 2019-04-29 DIAGNOSIS — C61 Malignant neoplasm of prostate: Secondary | ICD-10-CM

## 2019-04-29 DIAGNOSIS — R946 Abnormal results of thyroid function studies: Secondary | ICD-10-CM

## 2019-04-29 LAB — CBC W/ AUTO DIFF
BASOPHILS RELATIVE PERCENT: 0.4 %
EOSINOPHILS ABSOLUTE COUNT: 0.2 10*9/L (ref 0.0–0.4)
EOSINOPHILS RELATIVE PERCENT: 3.1 %
HEMATOCRIT: 42.8 % (ref 41.0–53.0)
HEMOGLOBIN: 13.5 g/dL (ref 13.5–17.5)
LARGE UNSTAINED CELLS: 3 % (ref 0–4)
LYMPHOCYTES ABSOLUTE COUNT: 2.6 10*9/L (ref 1.5–5.0)
LYMPHOCYTES RELATIVE PERCENT: 36 %
MEAN CORPUSCULAR HEMOGLOBIN CONC: 31.5 g/dL (ref 31.0–37.0)
MEAN CORPUSCULAR VOLUME: 101.6 fL — ABNORMAL HIGH (ref 80.0–100.0)
MEAN PLATELET VOLUME: 7.1 fL (ref 7.0–10.0)
MONOCYTES ABSOLUTE COUNT: 0.5 10*9/L (ref 0.2–0.8)
MONOCYTES RELATIVE PERCENT: 6.3 %
NEUTROPHILS ABSOLUTE COUNT: 3.7 10*9/L (ref 2.0–7.5)
NEUTROPHILS RELATIVE PERCENT: 51.5 %
PLATELET COUNT: 439 10*9/L (ref 150–440)
RED BLOOD CELL COUNT: 4.21 10*12/L — ABNORMAL LOW (ref 4.50–5.90)
RED CELL DISTRIBUTION WIDTH: 16.8 % — ABNORMAL HIGH (ref 12.0–15.0)

## 2019-04-29 LAB — LIPID PANEL
CHOLESTEROL/HDL RATIO SCREEN: 4.2
CHOLESTEROL: 166 mg/dL (ref 100–199)
HDL CHOLESTEROL: 40 mg/dL (ref 40–59)
LDL CHOLESTEROL CALCULATED: 89 mg/dL
NON-HDL CHOLESTEROL: 126 mg/dL

## 2019-04-29 LAB — LACTATE DEHYDROGENASE: Lactate dehydrogenase:CCnc:Pt:Ser/Plas:Qn:: 419

## 2019-04-29 LAB — CREATINE KINASE TOTAL: Creatine kinase:CCnc:Pt:Ser/Plas:Qn:: 46

## 2019-04-29 LAB — THYROID STIMULATING HORMONE
Thyrotropin:ACnc:Pt:Ser/Plas:Qn:: 18.67 — ABNORMAL HIGH
Thyrotropin:ACnc:Pt:Ser/Plas:Qn:: 19.29 — ABNORMAL HIGH

## 2019-04-29 LAB — SODIUM: Sodium:SCnc:Pt:Ser/Plas:Qn:: 142

## 2019-04-29 LAB — HDL CHOLESTEROL: Cholesterol.in HDL:MCnc:Pt:Ser/Plas:Qn:: 40

## 2019-04-29 LAB — GLUCOSE RANDOM: Glucose:MCnc:Pt:Ser/Plas:Qn:: 117

## 2019-04-29 LAB — AST (SGOT): Aspartate aminotransferase:CCnc:Pt:Ser/Plas:Qn:: 19

## 2019-04-29 LAB — URIC ACID: Urate:MCnc:Pt:Ser/Plas:Qn:: 4.2

## 2019-04-29 LAB — PROSTATE SPECIFIC ANTIGEN
Prostate specific Ag:MCnc:Pt:Ser/Plas:Qn:: 5.35 — ABNORMAL HIGH
Prostate specific Ag:MCnc:Pt:Ser/Plas:Qn:: 5.44 — ABNORMAL HIGH

## 2019-04-29 LAB — CREATININE
Creatinine:MCnc:Pt:Ser/Plas:Qn:: 0.88
EGFR CKD-EPI AA MALE: >90 mL/min/{1.73_m2} (ref >=60)

## 2019-04-29 LAB — HEMOGLOBIN: Hemoglobin:MCnc:Pt:Bld:Qn:: 13.5

## 2019-04-29 LAB — CALCIUM: Calcium:MCnc:Pt:Ser/Plas:Qn:: 9.4

## 2019-04-29 LAB — CO2: Carbon dioxide:SCnc:Pt:Ser/Plas:Qn:: 24

## 2019-04-29 LAB — BLOOD UREA NITROGEN: Urea nitrogen:MCnc:Pt:Ser/Plas:Qn:: 11

## 2019-04-29 LAB — BILIRUBIN TOTAL: Bilirubin:MCnc:Pt:Ser/Plas:Qn:: 0.5

## 2019-04-29 LAB — CHLORIDE: Chloride:SCnc:Pt:Ser/Plas:Qn:: 110 — ABNORMAL HIGH

## 2019-04-29 LAB — ALKALINE PHOSPHATASE: Alkaline phosphatase:CCnc:Pt:Ser/Plas:Qn:: 78

## 2019-04-29 LAB — FREE T4
Thyroxine.free:MCnc:Pt:Ser/Plas:Qn:: 1.08
Thyroxine.free:MCnc:Pt:Ser/Plas:Qn:: 1.1

## 2019-04-29 LAB — PHOSPHORUS: Phosphate:MCnc:Pt:Ser/Plas:Qn:: 3.7

## 2019-04-29 LAB — ALBUMIN: Albumin:MCnc:Pt:Ser/Plas:Qn:: 3.7

## 2019-04-29 LAB — ALT (SGPT): Alanine aminotransferase:CCnc:Pt:Ser/Plas:Qn:: 12

## 2019-04-29 LAB — POTASSIUM: Potassium:SCnc:Pt:Ser/Plas:Qn:: 4.3

## 2019-04-29 LAB — MAGNESIUM: Magnesium:MCnc:Pt:Ser/Plas:Qn:: 1.9

## 2019-04-29 NOTE — Unmapped (Signed)
Returned call to pt's wife. She was just clarifying synthroid change to 88 mcg daily. Told her that is dose per note and script sent in.

## 2019-04-29 NOTE — Unmapped (Signed)
Hi,     John Watkins contacted the Communication Center regarding the following:    - Questions about Synthroid and medication changes.     Please contact John Watkins at [phone].    Thanks in advance,    Drema Balzarine  [facility]   [phone]

## 2019-04-29 NOTE — Unmapped (Signed)
[facility] Refill Coordination Note    Specialty Medication(s) to be Shipped:   Hematology/Oncology: John Watkins, DOB: May 22, 1936  Phone: [phone] (home)       All above HIPAA information was verified with patient.     Completed refill call assessment today to schedule patient's medication shipment from the [facility] [phone]).       Specialty medication(s) and dose(s) confirmed: Regimen is correct and unchanged.   Changes to medications: John Watkins reports no changes at this time.  Changes to insurance: No  Questions for the pharmacist: No    Confirmed patient received Welcome Packet with first shipment. The patient will receive a drug information handout for each medication shipped and additional FDA Medication Guides as required.       DISEASE/MEDICATION-SPECIFIC INFORMATION        N/A    SPECIALTY MEDICATION ADHERENCE     Medication Adherence    Patient reported X missed doses in the last month: 0  Specialty Medication: Xtandi 40 mg  Patient is on additional specialty medications: No  Patient is on more than two specialty medications: No  Any gaps in refill history greater than 2 weeks in the last 3 months: no  Demonstrates understanding of importance of adherence: yes  Informant: other relative  Reliability of informant: reliable  Adherence tools used: medication list   Other adherence tool: routine   Support network for adherence: family member  Confirmed plan for next specialty medication refill: delivery by pharmacy  Refills needed for supportive medications: yes, ordered or provider notified          Refill Coordination    Has the Patients' Contact Information Changed: No  Is the Shipping Address Different: No           Xtandi 40 mg  : 11 days of medicine on hand     SHIPPING     Shipping address confirmed in Epic.     Delivery Scheduled: Yes, Expected medication delivery date: 05/09/2019.     Medication will be delivered via UPS to the home address in Epic [place].    John Watkins   [facility] Specialty Technician

## 2019-05-02 NOTE — Unmapped (Signed)
Necessary alterations to study procedures being made to minimize their risk of exposure to COVID-19. The patient was informed of all changes to study procedures, including reasonable alternatives. The patient will continue to be notified of new information as it becomes available.     Clinical Study: CPI-1205-201: A Phase 1b/2 Study of CPI-1205, a Small Molecule Inhibitor of EZH2, Combined with Enzalutamide or Abiraterone/Prednisone in Patients with Metastatic Castration Resistant Prostate Cancer     Study Treatment: Randomized Arm Phase 2 (Enzalutamide ??? Control Arm)     Date of Service: 07AUG2020     Cycle / Day: C8D1     Performance Status: ECOG PS: 0      Narrative: Patient had phone visit with Ardean Larsen, FNP on 06AUG2020 for C8D1 of CPI-1205 protocol therapy. No physical exam was performed due to COIVD-19 precautions. Patient will have labs drawn on 07AUG2020 and reviewed with [facility]. Gr 1 Hypothyroidism, will continue to monitor. Increased Synthroid to . Complaints of mild to moderate fatigue, not interfering with ADL???s. Pill diaries pending return from patient. Patient randomized to control arm on 21JAN2020.      Plan: Patient will be contacted in 4 weeks for C9D1 on 03AUG2020 with Dr. Vernell Barrier (currently scheduled for in person. Scans completed on 6JUL2020 showed stable disease. Patient and wife extensively educated on side effects, dosing schedule, medication diary and both verbalized understanding. Contact information for study coordinator and Dr. Vernell Barrier provided and patient agrees to contact office with any questions or concerns. Next scans scheduled for 01SEP2020.        Enzalutamide Med Compliance Log  Cycle  # Pills Dispensed  # Pills  Returned  # Pills  Dosed # Pills in Planned Dose % Dose Taken  Comments   Cycle 1 120 12 108 108 100    Cycle 2 120 12 108 108 100    Cycle 3 120 24 96 108 88 3 days in hospital ??? Patient error in completing diary   Cycle 4 120 12 108 108 100    Cycle 5 120 12 108 108 100    Cycle 6 120 12 108 108 100    Cycle 7 120        Cycle 8 120          Adverse Events Log:     Diagnosis Start Date   Stop Date Grade   Attribution  1: Unrelated 2: Unlikely 3: Possible 4: Probable 5: Definite 6: Unknown CS/NCS       Enzalutamide Prednisone    Hypothyroidism 19MAR2020 Ongoing 1 1 1  NCS   Fatigue 11MAR2020 Ongoing 1 3 1  NCS   Anemia 27MAR2020 Ongoing 1 3 1  NCS   Hypocalcemia 29MAR2020 15APR2020 1 2 1  NCS   Hypertriglyceridemia 15APR2020 13MAY2020 1 4 1  NCS   Hypertriglyceridemia 12JUN2020 Ongoing 1 4 1  NCS   Muscle Weakness 12JUN2020 Ongoing 1 4 1  NCS       Concomitant Medication Log:      Medication Dose/  Route Start Date Stop Date Indication Related to AE?  (yes/no, specify if yes)   Tamsulosin   0.4mg  PO QD 10JAN2020 Ongoing  Nocturia  Med Hx   Alendronate   70mg  PO Q Week 25JUL2019 Ongoing Health Maintenance Med Hx   Aspirin 81mg  PO  QD 30JAN2014 Ongoing Cardiac Prophylaxis Med Hx   Acetaminophen 500mg  PO Q6o PRN [id number] Ongoing Aches/Pains Med Hx   Amlodipine-Benazepril 5/20mg  PO QD 09MAR2018 16JAN2020 Hypertension Med Hx   Escitalopram Oxalate 10mg  PO  BID 01FEB2019 Ongoing  Anxiety/Depression Med Hx   Vitamin C   250mg  PO  3 tabs Q8o 01MAR2018 Ongoing General Health Med Hx   Calcium Carbonate 600mg  PO QD 24NOV2019 Ongoing Health Maintenance Med Hx   Prednisone 10mg  PO QD [id number] 07/2018 Prostate CA Med Hx   Abiraterone 250mg  PO 4 tab QD [id number] 07/2018 Prostate CA Med Hx   Multivitamin 1 Tab PO   QD 04FEB2015 Ongoing General Health Med Hx   Leuprolide 45mg  IM 04FEB2015 Ongoing Prostate CA Med Hx   Benazepril HCL 20mg  PO QD 17JAN2020 Ongoing Hypertension Med Hx   Levothyroxine PO QD 19MAR2020 16APR2020 Hypothyroidism A/E   Levothyroxine PO QD 17APR2020 06AUG2020 Hypothyroidism AE   Levothyroxine PO QD 07AUG2020 Ongoing Hypothyroidism AE                      Medical History  Diagnosis Start Date   Stop Date Grade   Clinically significant (yes/no)   Inguinal Hernia [id number] 2 No   Allergic Rhinitis 1995 1995 2 No   Bilateral Lower Extremity Edema 16DEC2019 Ongoing 1 No   Hypertension 03MAR2013 Ongoing 2 No   Nocutria [id number] Ongoing 1 No   Fatigue unAPR2013 Ongoing 1 No   Urinary Urgency [id number] Ongoing 1 No   Urinary Retention unAPR2013 Ongoing 1 No   Depression 01FEB2019 Ongoing 2 No   Aches and Pains unUNK2006 Ongoing 2 No   Anxiety 01FEB2019 Ongoing 2 No         Ezzard Flax, LPNII, CCRP  Study Coordinator  Pgr: [phone]

## 2019-05-02 NOTE — Unmapped (Signed)
Necessary alterations to study procedures being made to minimize their risk of exposure to COVID-19. The patient was informed of all changes to study procedures, including reasonable alternatives. The patient will continue to be notified of new information as it becomes available.       Clinical Study: CPI-1205-201: A Phase 1b/2 Study of CPI-1205, a Small Molecule Inhibitor of EZH2, Combined with Enzalutamide or Abiraterone/Prednisone in Patients with Metastatic Castration Resistant Prostate Cancer     Study Treatment: Randomized Arm Phase 2 (Enzalutamide ??? Control Arm)     Date of Service: 10JUL2020     Cycle / Day: C7D1     Performance Status: ECOG PS: 0      Narrative: Patient had phone visit with Frederic Jericho, MD on 09JUL2020 for C7D1 of CPI-1205 protocol therapy. No physical exam was performed due to COIVD-19 precautions. Patient had labs drawn on 10JUL2020 Gr 1 Hypothyroidism, will continue to monitor. Complaints of mild to moderate fatigue, not interfering with ADL???s. Pill diaries pending return from patient. Patient randomized to control arm on 21JAN2020.      Plan: Patient will be contacted in 4 weeks for C8D1 on 08AUG2020. Scans scheduled for 06JUL2020. Patient and wife extensively educated on side effects, dosing schedule, medication diary and both verbalized understanding. Contact information for study coordinator and Dr. Vernell Barrier provided and patient agrees to contact office with any questions or concerns. Patient no showed for scans and were rescheduled for 16JUL2020.       Enzalutamide Med Compliance Log  Cycle  # Pills Dispensed  # Pills  Returned  # Pills  Dosed # Pills in Planned Dose % Dose Taken  Comments   Cycle 1 120 12 108 108 100    Cycle 2 120 12 108 108 100    Cycle 3 120 24 96 108 88 3 days in hospital ??? Patient error in completing diary   Cycle 4 120 12 108 108 100    Cycle 5 120 12 108 108 100    Cycle 6 120 12 108 108 100    Cycel 7           Adverse Events Log:     Diagnosis Start Date Stop Date Grade   Attribution  1: Unrelated 2: Unlikely 3: Possible 4: Probable 5: Definite 6: Unknown CS/NCS       Enzalutamide Prednisone    Hypothyroidism 19MAR2020 Ongoing 1 1 1  NCS   Fatigue 11MAR2020 Ongoing 1 3 1  NCS   Anemia 27MAR2020 Ongoing 1 3 1  NCS   Hypocalcemia 29MAR2020 15APR2020 1 2 1  NCS   Hypertriglyceridemia 15APR2020 13MAY2020 1      Hypertriglyceridemia 12JUN2020 Ongoing 1      Muscle Weakness 12JUN2020 Ongoing 1          Concomitant Medication Log:      Medication Dose/  Route Start Date Stop Date Indication Related to AE?  (yes/no, specify if yes)   Tamsulosin   0.4mg  PO QD 10JAN2020 Ongoing  Nocturia  Med Hx   Alendronate   70mg  PO Q Week 25JUL2019 Ongoing Health Maintenance Med Hx   Aspirin 81mg  PO  QD 30JAN2014 Ongoing Cardiac Prophylaxis Med Hx   Acetaminophen 500mg  PO Q6o PRN [id number] Ongoing Aches/Pains Med Hx   Amlodipine-Benazepril 5/20mg  PO QD 09MAR2018 16JAN2020 Hypertension Med Hx   Escitalopram Oxalate 10mg  PO BID 01FEB2019 Ongoing  Anxiety/Depression Med Hx   Vitamin C   250mg  PO  3 tabs Q8o 01MAR2018 Ongoing General Health Med Hx  Calcium Carbonate 600mg  PO QD 24NOV2019 Ongoing Health Maintenance Med Hx   Prednisone 10mg  PO QD [id number] 07/2018 Prostate CA Med Hx   Abiraterone 250mg  PO 4 tab QD [id number] 07/2018 Prostate CA Med Hx   Multivitamin 1 Tab PO   QD 04FEB2015 Ongoing General Health Med Hx   Leuprolide 45mg  IM 04FEB2015 Ongoing Prostate CA Med Hx   Benazepril HCL 20mg  PO QD 17JAN2020 Ongoing Hypertension Med Hx   Levothyroxine PO QD 19MAR2020 16APR2020 Hypothyroidism A/E   Levothyroxine PO QD 17APR2020 Ongoing Hypothyroidism AE      Medical History  Diagnosis Start Date   Stop Date Grade   Clinically significant (yes/no)   Inguinal Hernia [id number] 2 No   Allergic Rhinitis 1995 1995 2 No   Bilateral Lower Extremity Edema 16DEC2019 Ongoing 1 No   Hypertension 03MAR2013 Ongoing 2 No   Nocutria [id number] Ongoing 1 No   Fatigue unAPR2013 Ongoing 1 No Urinary Urgency [id number] Ongoing 1 No   Urinary Retention unAPR2013 Ongoing 1 No   Depression 01FEB2019 Ongoing 2 No   Aches and Pains unUNK2006 Ongoing 2 No   Anxiety 01FEB2019 Ongoing 2 No         Ezzard Flax, LPNII, CCRP  Study Coordinator  Pgr: [phone]

## 2019-05-05 MED ORDER — ENZALUTAMIDE 40 MG CAPSULE
ORAL_CAPSULE | Freq: Every day | ORAL | 6 refills | 30 days | Status: CP
Start: 2019-05-05 — End: ?
  Filled 2019-05-09: qty 120, 30d supply, fill #0

## 2019-05-09 MED FILL — XTANDI 40 MG CAPSULE: 30 days supply | Qty: 120 | Fill #0 | Status: AC

## 2019-05-26 ENCOUNTER — Encounter: Admit: 2019-05-26 | Discharge: 2019-05-26 | Payer: MEDICARE | Attending: Medical Oncology | Primary: Medical Oncology

## 2019-05-26 ENCOUNTER — Ambulatory Visit: Admit: 2019-05-26 | Discharge: 2019-05-26 | Payer: MEDICARE

## 2019-05-26 ENCOUNTER — Telehealth: Admit: 2019-05-26 | Discharge: 2019-05-26 | Payer: MEDICARE | Attending: Medical Oncology | Primary: Medical Oncology

## 2019-05-26 DIAGNOSIS — C61 Malignant neoplasm of prostate: Secondary | ICD-10-CM

## 2019-05-26 DIAGNOSIS — R946 Abnormal results of thyroid function studies: Secondary | ICD-10-CM

## 2019-05-26 DIAGNOSIS — R799 Abnormal finding of blood chemistry, unspecified: Secondary | ICD-10-CM

## 2019-05-26 DIAGNOSIS — M8580 Other specified disorders of bone density and structure, unspecified site: Secondary | ICD-10-CM

## 2019-05-26 DIAGNOSIS — R791 Abnormal coagulation profile: Secondary | ICD-10-CM

## 2019-05-26 DIAGNOSIS — C775 Secondary and unspecified malignant neoplasm of intrapelvic lymph nodes: Secondary | ICD-10-CM

## 2019-05-26 LAB — CBC W/ AUTO DIFF
BASOPHILS ABSOLUTE COUNT: 0 10*9/L (ref 0.0–0.1)
BASOPHILS RELATIVE PERCENT: 0.5 %
EOSINOPHILS ABSOLUTE COUNT: 0.4 10*9/L (ref 0.0–0.4)
EOSINOPHILS RELATIVE PERCENT: 4.7 %
HEMATOCRIT: 40.6 % — ABNORMAL LOW (ref 41.0–53.0)
HEMOGLOBIN: 13.2 g/dL — ABNORMAL LOW (ref 13.5–17.5)
LARGE UNSTAINED CELLS: 3 % (ref 0–4)
LYMPHOCYTES RELATIVE PERCENT: 49.2 %
MEAN CORPUSCULAR HEMOGLOBIN CONC: 32.6 g/dL (ref 31.0–37.0)
MEAN CORPUSCULAR HEMOGLOBIN: 33.3 pg (ref 26.0–34.0)
MEAN CORPUSCULAR VOLUME: 102.2 fL — ABNORMAL HIGH (ref 80.0–100.0)
MEAN PLATELET VOLUME: 6.9 fL — ABNORMAL LOW (ref 7.0–10.0)
MONOCYTES ABSOLUTE COUNT: 0.4 10*9/L (ref 0.2–0.8)
NEUTROPHILS ABSOLUTE COUNT: 2.8 10*9/L (ref 2.0–7.5)
NEUTROPHILS RELATIVE PERCENT: 36.8 %
PLATELET COUNT: 389 10*9/L (ref 150–440)
RED BLOOD CELL COUNT: 3.98 10*12/L — ABNORMAL LOW (ref 4.50–5.90)
RED CELL DISTRIBUTION WIDTH: 16.4 % — ABNORMAL HIGH (ref 12.0–15.0)
WBC ADJUSTED: 7.7 10*9/L (ref 4.5–11.0)

## 2019-05-26 LAB — CREATINE KINASE TOTAL: Creatine kinase:CCnc:Pt:Ser/Plas:Qn:: 48

## 2019-05-26 LAB — ALT (SGPT): Alanine aminotransferase:CCnc:Pt:Ser/Plas:Qn:: 11

## 2019-05-26 LAB — LDL CHOLESTEROL CALCULATED: Cholesterol.in LDL:MCnc:Pt:Ser/Plas:Qn:Calculated: 93

## 2019-05-26 LAB — GLUCOSE, RANDOM: GLUCOSE RANDOM: 106 mg/dL (ref 70–179)

## 2019-05-26 LAB — LIPID PANEL
CHOLESTEROL/HDL RATIO SCREEN: 4.1
HDL CHOLESTEROL: 39 mg/dL — ABNORMAL LOW (ref 40–59)
LDL CHOLESTEROL CALCULATED: 93 mg/dL
NON-HDL CHOLESTEROL: 121 mg/dL
TRIGLYCERIDES: 139 mg/dL (ref 1–149)
VLDL CHOLESTEROL CAL: 27.8 mg/dL

## 2019-05-26 LAB — CREATININE
Creatinine:MCnc:Pt:Ser/Plas:Qn:: 0.9
EGFR CKD-EPI NON-AA MALE: 80 mL/min/{1.73_m2} (ref >=60)

## 2019-05-26 LAB — LACTATE DEHYDROGENASE: Lactate dehydrogenase:CCnc:Pt:Ser/Plas:Qn:: 415

## 2019-05-26 LAB — PHOSPHORUS: Phosphate:MCnc:Pt:Ser/Plas:Qn:: 3.9

## 2019-05-26 LAB — AST (SGOT): Aspartate aminotransferase:CCnc:Pt:Ser/Plas:Qn:: 19

## 2019-05-26 LAB — POTASSIUM: Potassium:SCnc:Pt:Ser/Plas:Qn:: 4.4

## 2019-05-26 LAB — MAGNESIUM: Magnesium:MCnc:Pt:Ser/Plas:Qn:: 1.9

## 2019-05-26 LAB — LYMPHOCYTES RELATIVE PERCENT: Lymphocytes/100 leukocytes:NFr:Pt:Bld:Qn:Automated count: 49.2

## 2019-05-26 LAB — ALBUMIN: Albumin:MCnc:Pt:Ser/Plas:Qn:: 3.7

## 2019-05-26 LAB — PROSTATE SPECIFIC ANTIGEN: Prostate specific Ag:MCnc:Pt:Ser/Plas:Qn:: 4.68 — ABNORMAL HIGH

## 2019-05-26 LAB — ALKALINE PHOSPHATASE: Alkaline phosphatase:CCnc:Pt:Ser/Plas:Qn:: 74

## 2019-05-26 LAB — GLUCOSE RANDOM: Glucose:MCnc:Pt:Ser/Plas:Qn:: 106

## 2019-05-26 LAB — URIC ACID: Urate:MCnc:Pt:Ser/Plas:Qn:: 4.7

## 2019-05-26 LAB — T4 TOTAL: Thyroxine:MCnc:Pt:Ser/Plas:Qn:: 7.41

## 2019-05-26 LAB — FREE T4: Thyroxine.free:MCnc:Pt:Ser/Plas:Qn:: 1.28

## 2019-05-26 LAB — BLOOD UREA NITROGEN: Urea nitrogen:MCnc:Pt:Ser/Plas:Qn:: 12

## 2019-05-26 LAB — CALCIUM: Calcium:MCnc:Pt:Ser/Plas:Qn:: 9.5

## 2019-05-26 LAB — BILIRUBIN, TOTAL: BILIRUBIN TOTAL: 0.4 mg/dL (ref 0.0–1.2)

## 2019-05-26 LAB — THYROID STIMULATING HORMONE
Thyrotropin:ACnc:Pt:Ser/Plas:Qn:: 15.1 — ABNORMAL HIGH
Thyrotropin:ACnc:Pt:Ser/Plas:Qn:: 15.2 — ABNORMAL HIGH

## 2019-05-26 LAB — SODIUM: Sodium:SCnc:Pt:Ser/Plas:Qn:: 139

## 2019-05-26 LAB — BILIRUBIN TOTAL: Bilirubin:MCnc:Pt:Ser/Plas:Qn:: 0.4

## 2019-05-26 LAB — CO2: Carbon dioxide:SCnc:Pt:Ser/Plas:Qn:: 23

## 2019-05-26 LAB — CHLORIDE: Chloride:SCnc:Pt:Ser/Plas:Qn:: 109 — ABNORMAL HIGH

## 2019-05-26 NOTE — Unmapped (Signed)
Medical Oncology (Prostate Cancer)    I spent 20 minutes on the audio/video with the patient. I spent an additional 20 minutes on pre- and post-visit activities.     The patient was physically located in [place] or a state in which I am permitted to provide care. The patient and/or parent/gauardian understood that s/he may incur co-pays and cost sharing, and agreed to the telemedicine visit. The visit was completed via phone and/or video, which was appropriate and reasonable under the circumstances given the patient's presentation at the time.    The patient and/or parent/guardian has been advised of the potential risks and limitations of this mode of treatment (including, but not limited to, the absence of in-person examination) and has agreed to be treated using telemedicine. The patient's/patient's family's questions regarding telemedicine have been answered.     If the phone/video visit was completed in an ambulatory setting, the patient and/or parent/guardian has also been advised to contact their provider???s office for worsening conditions, and seek emergency medical treatment and/or call 911 if the patient deems either necessary.    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 He has been asymptomatic.  He has had RT to the prostate in the past.  He is on clinical trial CPI-1201-201 (ProStar) on the enzalutamide monotherapy arm.  His course was complicated by admission with a GI bleed from diverticulum which was (likely) unrelated to study treatment.      Restaging on 04/07/19 with bone scan and CT CAP showed stable lymphadenopathy and no bone metastases.  PSA decreased with initiation of trial and has remained essentially stable.  Overall these findings suggested stable disease on trial.      No new AEs attributable to study drug.  He is feeling well but has ongoing stable feeling of fatigue and weakness particularly in legs.  PSA remains stable.    Plan:  - Continue Eligard 45mg  every 6 months (last received 02/02/19).  - Continue CPI-1201-201 (ProStar) trial, enzalutamide monotherapy arm.    - Hypothyroid: Seeing Endocrinology.  - I previously asked Radiation Oncology to weigh in about potential RT to the lymph nodes, and they felt this would not be beneficial and would confer toxicity without likely long-term disease control.    - Continue BP meds (started previously w abiraterone).  Continue blood pressure control with his PCP.  - Clinical genetics saw him, negative genomic screening.  - Nocturia: persistent, does not want Flomax.   - Anxiety: On Lexapro - Pharmacy ran interactions for both olaparib and abiraterone.  - Smoking: He understands risks, and the [facility] smoking cessation program here has been meeting with him for counseling.  - Bone density: Checked again in 6/19 and although only mildly low, had 10% loss in bone density from 2016-2019 on Lupron and now prednisone.  On Fosamax, calcium/D.    He is DNR/DNI.  His proxy is his wife.    Follow up per trial. Patent consented to virtual encounters.      -------------------------------------------------------    Referring physician:  Riki Altes, MD [facility])  Other physician:  Irven Easterly, MD    CC: Prostate cancer to lymph nodes and possibly bone s/p IMRT to prostate.    HPI:  2010: IMRT to prostate for T1c prostate adenocarcinoma, biopsy with up to Gleason 4+3=7.  Subsequent PSA rise with negative saturation biopsy on 11/17/12.  - 04/05/13: 8.3  - 08/29/13: 12.8  -  02/09/14: 13.3  10/2013: MRI with enlarged left obturator, iliac and para-aortic nodes up to 3 cm consistent with metastatic disease. Bilateral ischia and L pelvis with bony areas <55mm of unclear etiology (bone islands vs. metastases).  10/2013: Lupron started  Subsequent PSA:  - 05/01/14: 1.4  - 08/02/14: 1.4  - 11/03/14: 1.7  01/18/16: Bone scan: no metastases.  01/18/16: CT A/P: 2.6 cm left external iliac chain hyperenhancing node (series 6, image 57). Several prominent subcentimeter lymph nodes are seen along the left para-aortic retroperitoneum.  Prostate is enlarged measuring 6.0 x 6.7 x 9.3 cm. [Possible lipoma in gluteus maximus, or could be related to Lupron injection, discussed w urology, no action warranted.]  10/16/16: restaging:   - CT CAP: Interval increase in size of left external and common iliac chain as well as periaortic lymph nodes, worrisome for metastatic disease.   - Bone Scan: No evidence of osseous metastatic disease.  5/18: Abiraterone/prednisone  6/19: BMD (c/w 2016): Lumbar spine: ??Normal bone density. ??The measurement has decreased significantly since prior study.  Left proximal femur: Mildly low bone density. ??The measurement has decreased significantly since prior study.  04/15/18: PSA 10.6  08/04/18: PSA rising on abiraterone, now 16.7  08/04/18: Bone scan: No evidence of osseous metastasis.  08/04/18: CT CAP: Interval increase in external iliac, periaortic, and aortocaval lymphadenopathy concerning for disease progression.  1/20: Start enzalutamide on trial.  02/02/19: Bone scan restaging: No new abnormal foci of uptake to suggest osseous metastatic disease.  02/02/19: CT CAP: -Multistation abdominopelvic lymphadenopathy, overall stable to slightly decreased in size when compared to CT from 12/08/2018 with the exception of a few bilateral inguinal lymph nodes which are minimally larger in size. -Otherwise, no new sites of metastatic disease in the abdomen or pelvis. -Additional chronic/incidental findings as above, unchanged.  04/07/19: Restaging CT CAP and BS for trial: No bone metastatses seen. Stable multistation abdominopelvic lymphadenopathy.No new sites of metastatic disease in the chest, abdomen or pelvis.    ROS: Nocturia stable at every 2-3 hours, doesn't bother him.  No pain.  Good appetite.  Has tolerated Lupron well.  Remainder of 10 system ROS negative. Ongoing feeling of fatigue and weakness particularly in legs.    PE:  ECOG 0  There were no vitals taken for this visit.      Current Outpatient Medications   Medication Sig Dispense Refill   ??? ascorbic acid, vitamin C, (VITAMIN C) 250 MG tablet Take 250 mg by mouth.     ??? benazepriL (LOTENSIN) 20 MG tablet Take 10 mg by mouth daily. Pt said he was advised to split pills and take 10 mg per day      ??? calcium carbonate/vitamin D3 (CALCIUM 600 + D,3, ORAL) Take 1 capsule by mouth daily.     ??? enzalutamide (XTANDI) 40 mg capsule Take 4 capsules (160 mg total) by mouth daily. 120 capsule 6   ??? escitalopram oxalate (LEXAPRO) 10 MG tablet Take 1 tablet (10 mg total) by mouth daily. 90 tablet 1   ??? hydroCHLOROthiazide (HYDRODIURIL) 12.5 MG tablet Take 6.25 mg by mouth daily.     ??? leuprolide (LUPRON) 22.5 mg injection Inject 22.5 mg into the muscle Every three (3) months.     ??? levothyroxine (SYNTHROID) 88 MCG tablet Take 1 tablet (88 mcg total) by mouth daily. 30 tablet 11   ??? multivitamin (THERAGRAN) per tablet Take 1 tablet by mouth daily.     ??? tamsulosin (FLOMAX) 0.4 mg capsule Take 1 capsule (  0.4 mg total) by mouth daily. 90 capsule 3   ??? alendronate (FOSAMAX) 70 MG tablet Take 1 tablet (70 mg total) by mouth every seven (7) days. (Patient taking differently: Take 70 mg by mouth Every Monday. ) 12.85 tablet 3     No current facility-administered medications for this visit.      Allergies   Allergen Reactions   ??? Valium [Diazepam] Other (See Comments)     Hallucinations, altered mental status  Hallucinations, altered mental status           Research Encounter on 05/23/2019   Component Date Value Ref Range Status   ??? Sodium 05/26/2019 139  135 - 145 mmol/L Final   ??? Potassium 05/26/2019 4.4  3.5 - 5.0 mmol/L Final   ??? Chloride 05/26/2019 109* 98 - 107 mmol/L Final   ??? CO2 05/26/2019 23.0  22.0 - 30.0 mmol/L Final   ??? BUN 05/26/2019 12  7 - 21 mg/dL Final   ??? Creatinine 05/26/2019 0.90  0.70 - 1.30 mg/dL Final   ??? EGFR CKD-EPI Non-African American,* 05/26/2019 80  >=60 mL/min/1.89m2 Final   ??? EGFR CKD-EPI African American, Male 05/26/2019 >90  >=60 mL/min/1.40m2 Final   ??? Glucose 05/26/2019 106  70 - 179 mg/dL Final   ??? Calcium 16/06/9603 9.5  8.5 - 10.2 mg/dL Final   ??? Magnesium 54/05/8118 1.9  1.6 - 2.2 mg/dL Final   ??? Phosphorus 05/26/2019 3.9  2.9 - 4.7 mg/dL Final   ??? Albumin 14/78/2956 3.7  3.5 - 5.0 g/dL Final   ??? Total Bilirubin 05/26/2019 0.4  0.0 - 1.2 mg/dL Final   ??? ALT 21/30/8657 11  <50 U/L Final   ??? AST 05/26/2019 19  19 - 55 U/L Final   ??? Alkaline Phosphatase 05/26/2019 74  38 - 126 U/L Final   ??? LDH 05/26/2019 415  338 - 610 U/L Final   ??? Uric Acid 05/26/2019 4.7  4.0 - 9.0 mg/dL Final   ??? Creatine Kinase, Total 05/26/2019 48.0  45.0 - 250.0 U/L Final   ??? Triglycerides 05/26/2019 139  1 - 149 mg/dL Final   ??? Cholesterol 05/26/2019 160  100 - 199 mg/dL Final   ??? HDL 84/69/6295 39* 40 - 59 mg/dL Final   ??? LDL Calculated 05/26/2019 93  mg/dL Final   ??? VLDL Cholesterol Cal 05/26/2019 27.8  mg/dL Final   ??? Chol/HDL Ratio 05/26/2019 4.1   Final   ??? Non-HDL Cholesterol 05/26/2019 121  mg/dL Final   ??? FASTING 28/41/3244 Yes   Final   ??? WBC 05/26/2019 7.7  4.5 - 11.0 10*9/L Final   ??? RBC 05/26/2019 3.98* 4.50 - 5.90 10*12/L Final   ??? HGB 05/26/2019 13.2* 13.5 - 17.5 g/dL Final   ??? HCT 09/24/7251 40.6* 41.0 - 53.0 % Final   ??? MCV 05/26/2019 102.2* 80.0 - 100.0 fL Final   ??? MCH 05/26/2019 33.3  26.0 - 34.0 pg Final   ??? MCHC 05/26/2019 32.6  31.0 - 37.0 g/dL Final   ??? RDW 66/44/0347 16.4* 12.0 - 15.0 % Final   ??? MPV 05/26/2019 6.9* 7.0 - 10.0 fL Final   ??? Platelet 05/26/2019 389  150 - 440 10*9/L Final   ??? Neutrophils % 05/26/2019 36.8  % Final   ??? Lymphocytes % 05/26/2019 49.2  % Final   ??? Monocytes % 05/26/2019 5.6  % Final   ??? Eosinophils % 05/26/2019 4.7  % Final   ??? Basophils % 05/26/2019 0.5  %  Final   ??? Absolute Neutrophils 05/26/2019 2.8  2.0 - 7.5 10*9/L Final   ??? Absolute Lymphocytes 05/26/2019 3.8  1.5 - 5.0 10*9/L Final   ??? Absolute Monocytes 05/26/2019 0.4  0.2 - 0.8 10*9/L Final   ??? Absolute Eosinophils 05/26/2019 0.4  0.0 - 0.4 10*9/L Final   ??? Absolute Basophils 05/26/2019 0.0  0.0 - 0.1 10*9/L Final   ??? Large Unstained Cells 05/26/2019 3  0 - 4 % Final   ??? Macrocytosis 05/26/2019 Moderate* Not Present Final   ??? Anisocytosis 05/26/2019 Slight* Not Present Final       Lab Results   Component Value Date    PSA 5.44 (H) 04/29/2019    PSA 5.35 (H) 04/29/2019    PSA 5.01 (H) 04/01/2019    PSA 5.21 (H) 03/04/2019    PSA 5.02 (H) 02/02/2019    PSA 6.15 (H) 01/05/2019

## 2019-05-26 NOTE — Unmapped (Signed)
Reviewed Meds and Allergies, confirmed pharmacy and informed pt Provider would be calling him shortly for PHONE visit.

## 2019-05-26 NOTE — Unmapped (Signed)
Nice speaking with you today.   If you have any questions please call the Nurse Navigator in the office at [phone], or [phone]) [phone].  Frederic Jericho, MD

## 2019-05-27 NOTE — Unmapped (Signed)
Necessary alterations to study procedures being made to minimize their risk of exposure to COVID-19. The patient was informed of all changes to study procedures, including reasonable alternatives. The patient will continue to be notified of new information as it becomes available.     Clinical Study: CPI-1205-201: A Phase 1b/2 Study of CPI-1205, a Small Molecule Inhibitor of EZH2, Combined with Enzalutamide or Abiraterone/Prednisone in Patients with Metastatic Castration Resistant Prostate Cancer     Study Treatment: Randomized Arm Phase 2 (Enzalutamide ??? Control Arm)     Date of Service: 033SEP2020     Cycle / Day: C9D1     Performance Status: ECOG PS: 0      Narrative: Patient had phone visit with Frederic Jericho, MD on 003SEP2020 for C9D1 of CPI-1205 protocol therapy. No physical exam was performed due to COIVD-19 precautions. Patient will have labs drawn on 03SEP2020 and reviewed by Dr. Vernell Barrier. Gr 1 Hypothyroidism, will continue to monitor. Increased Synthroid to . Complaints of mild to moderate fatigue, not interfering with ADL???s. Pill diaries pending return from patient. Patient randomized to control arm on 21JAN2020.      Plan: Patient will be contacted in 4 weeks for C10D1 on 10SEP2020 with Dr. Vernell Barrier (currently scheduled for in person. Scans completed on 6JUL2020 showed stable disease. Patient and wife extensively educated on side effects, dosing schedule, medication diary and both verbalized understanding. Contact information for study coordinator and Dr. Vernell Barrier provided and patient agrees to contact office with any questions or concerns. Next scans re-scheduled for 14SEP2020 at patient request.        Enzalutamide Med Compliance Log  Cycle  # Pills Dispensed  # Pills  Returned  # Pills  Dosed # Pills in Planned Dose % Dose Taken  Comments   Cycle 1 120 12 108 108 100    Cycle 2 120 12 108 108 100    Cycle 3 120 24 96 108 88 3 days in hospital ??? Patient error in completing diary   Cycle 4 120 12 108 108 100 Cycle 5 120 12 108 108 100    Cycle 6 120 12 108 108 100    Cycle 7 120        Cycle 8 120          Adverse Events Log:     Diagnosis Start Date   Stop Date Grade   Attribution  1: Unrelated 2: Unlikely 3: Possible 4: Probable 5: Definite 6: Unknown CS/NCS       Enzalutamide Prednisone    Hypothyroidism 19MAR2020 Ongoing 1 1 1  NCS   Fatigue 11MAR2020 Ongoing 1 3 1  NCS   Anemia 27MAR2020 Ongoing 1 3 1  NCS   Hypocalcemia 29MAR2020 15APR2020 1 2 1  NCS   Hypertriglyceridemia 15APR2020 13MAY2020 1 4 1  NCS   Hypertriglyceridemia 12JUN2020 Ongoing 1 4 1  NCS   Muscle Weakness 12JUN2020 Ongoing 1 4 1  NCS       Concomitant Medication Log:      Medication Dose/  Route Start Date Stop Date Indication Related to AE?  (yes/no, specify if yes)   Tamsulosin   0.4mg  PO QD 10JAN2020 Ongoing  Nocturia  Med Hx   Alendronate   70mg  PO Q Week 25JUL2019 Ongoing Health Maintenance Med Hx   Aspirin 81mg  PO  QD 30JAN2014 Ongoing Cardiac Prophylaxis Med Hx   Acetaminophen 500mg  PO Q6o PRN [id number] Ongoing Aches/Pains Med Hx   Amlodipine-Benazepril 5/20mg  PO QD 09MAR2018 16JAN2020 Hypertension Med Hx   Escitalopram Oxalate 10mg   PO BID 01FEB2019 Ongoing  Anxiety/Depression Med Hx   Vitamin C   250mg  PO  3 tabs Q8o 01MAR2018 Ongoing General Health Med Hx   Calcium Carbonate 600mg  PO QD 24NOV2019 Ongoing Health Maintenance Med Hx   Prednisone 10mg  PO QD [id number] 07/2018 Prostate CA Med Hx   Abiraterone 250mg  PO 4 tab QD [id number] 07/2018 Prostate CA Med Hx   Multivitamin 1 Tab PO   QD 04FEB2015 Ongoing General Health Med Hx   Leuprolide 45mg  IM 04FEB2015 Ongoing Prostate CA Med Hx   Benazepril HCL 20mg  PO QD 17JAN2020 Ongoing Hypertension Med Hx   Levothyroxine PO QD 19MAR2020 16APR2020 Hypothyroidism A/E   Levothyroxine PO QD 17APR2020 06AUG2020 Hypothyroidism AE   Levothyroxine PO QD 07AUG2020 Ongoing Hypothyroidism AE                      Medical History  Diagnosis Start Date   Stop Date Grade   Clinically significant (yes/no) Inguinal Hernia [id number] 2 No   Allergic Rhinitis 1995 1995 2 No   Bilateral Lower Extremity Edema 16DEC2019 Ongoing 1 No   Hypertension 03MAR2013 Ongoing 2 No   Nocutria [id number] Ongoing 1 No   Fatigue unAPR2013 Ongoing 1 No   Urinary Urgency [id number] Ongoing 1 No   Urinary Retention unAPR2013 Ongoing 1 No   Depression 01FEB2019 Ongoing 2 No   Aches and Pains unUNK2006 Ongoing 2 No   Anxiety 01FEB2019 Ongoing 2 No         Ezzard Flax, LPNII, CCRP  Study Coordinator  Pgr: [phone]

## 2019-05-30 MED ORDER — ALENDRONATE 70 MG TABLET
ORAL_TABLET | ORAL | 0 refills | 357.00000 days | Status: CP
Start: 2019-05-30 — End: 2020-05-29

## 2019-06-02 NOTE — Unmapped (Signed)
[facility] Refill Coordination Note    Specialty Medication(s) to be Shipped:   Hematology/Oncology: John Watkins 40 mg       John Watkins, DOB: Aug 22, 1936  Phone: [phone] (home)       All above HIPAA information was verified with patient.     Completed refill call assessment today to schedule patient's medication shipment from the [facility] [phone]).       Specialty medication(s) and dose(s) confirmed: Regimen is correct and unchanged.   Changes to medications: [name] reports no changes at this time.  Changes to insurance: No  Questions for the pharmacist: No    Confirmed patient received Welcome Packet with first shipment. The patient will receive a drug information handout for each medication shipped and additional FDA Medication Guides as required.       DISEASE/MEDICATION-SPECIFIC INFORMATION        N/A    SPECIALTY MEDICATION ADHERENCE     Medication Adherence    Patient reported X missed doses in the last month: 0  Specialty Medication: Xtandi 40 mg  Patient is on additional specialty medications: No  Patient is on more than two specialty medications: No  Any gaps in refill history greater than 2 weeks in the last 3 months: no  Demonstrates understanding of importance of adherence: yes  Informant: patient  Reliability of informant: reliable  Adherence tools used: medication list   Other adherence tool: routine   Support network for adherence: family member  Confirmed plan for next specialty medication refill: delivery by pharmacy          Refill Coordination    Has the Patients' Contact Information Changed: No  Is the Shipping Address Different: No           Xtandi 40 mg  : 7 days of medicine on hand       SHIPPING     Shipping address confirmed in Epic.     Delivery Scheduled: Yes, Expected medication delivery date: 06/08/2019.     Medication will be delivered via Same Day Courier to the home address in Epic [place].    John Watkins   [facility] Specialty Technician

## 2019-06-07 DIAGNOSIS — C61 Malignant neoplasm of prostate: Secondary | ICD-10-CM

## 2019-06-08 MED FILL — XTANDI 40 MG CAPSULE: ORAL | 30 days supply | Qty: 120 | Fill #1

## 2019-06-08 MED FILL — XTANDI 40 MG CAPSULE: 30 days supply | Qty: 120 | Fill #1 | Status: AC

## 2019-06-17 ENCOUNTER — Ambulatory Visit (INDEPENDENT_AMBULATORY_CARE_PROVIDER_SITE_OTHER): Payer: Medicare HMO

## 2019-06-17 ENCOUNTER — Other Ambulatory Visit: Payer: Self-pay

## 2019-06-17 DIAGNOSIS — Z23 Encounter for immunization: Secondary | ICD-10-CM | POA: Diagnosis not present

## 2019-06-22 ENCOUNTER — Encounter: Admit: 2019-06-22 | Discharge: 2019-06-22 | Payer: MEDICARE | Attending: Medical Oncology | Primary: Medical Oncology

## 2019-06-22 ENCOUNTER — Ambulatory Visit: Admit: 2019-06-22 | Discharge: 2019-06-22 | Payer: MEDICARE

## 2019-06-22 DIAGNOSIS — R946 Abnormal results of thyroid function studies: Secondary | ICD-10-CM

## 2019-06-22 DIAGNOSIS — R799 Abnormal finding of blood chemistry, unspecified: Secondary | ICD-10-CM

## 2019-06-22 DIAGNOSIS — C61 Malignant neoplasm of prostate: Secondary | ICD-10-CM

## 2019-06-22 DIAGNOSIS — R791 Abnormal coagulation profile: Secondary | ICD-10-CM

## 2019-06-22 DIAGNOSIS — C775 Secondary and unspecified malignant neoplasm of intrapelvic lymph nodes: Secondary | ICD-10-CM

## 2019-06-22 LAB — CBC W/ AUTO DIFF
BASOPHILS ABSOLUTE COUNT: 0 10*9/L (ref 0.0–0.1)
BASOPHILS RELATIVE PERCENT: 0.6 %
EOSINOPHILS ABSOLUTE COUNT: 0.3 10*9/L (ref 0.0–0.4)
EOSINOPHILS RELATIVE PERCENT: 4.3 %
HEMATOCRIT: 38 % — ABNORMAL LOW (ref 41.0–53.0)
HEMOGLOBIN: 12.7 g/dL — ABNORMAL LOW (ref 13.5–17.5)
LARGE UNSTAINED CELLS: 3 % (ref 0–4)
LYMPHOCYTES ABSOLUTE COUNT: 2.9 10*9/L (ref 1.5–5.0)
LYMPHOCYTES RELATIVE PERCENT: 40.7 %
MEAN CORPUSCULAR HEMOGLOBIN CONC: 33.4 g/dL (ref 31.0–37.0)
MEAN CORPUSCULAR VOLUME: 100.9 fL — ABNORMAL HIGH (ref 80.0–100.0)
MEAN PLATELET VOLUME: 6.9 fL — ABNORMAL LOW (ref 7.0–10.0)
MONOCYTES ABSOLUTE COUNT: 0.5 10*9/L (ref 0.2–0.8)
MONOCYTES RELATIVE PERCENT: 6.3 %
NEUTROPHILS ABSOLUTE COUNT: 3.3 10*9/L (ref 2.0–7.5)
NEUTROPHILS RELATIVE PERCENT: 45.4 %
RED BLOOD CELL COUNT: 3.77 10*12/L — ABNORMAL LOW (ref 4.50–5.90)
RED CELL DISTRIBUTION WIDTH: 15.7 % — ABNORMAL HIGH (ref 12.0–15.0)
WBC ADJUSTED: 7.2 10*9/L (ref 4.5–11.0)

## 2019-06-22 LAB — GLUCOSE RANDOM: Glucose:MCnc:Pt:Ser/Plas:Qn:: 103

## 2019-06-22 LAB — ALBUMIN: Albumin:MCnc:Pt:Ser/Plas:Qn:: 3.7

## 2019-06-22 LAB — CO2: Carbon dioxide:SCnc:Pt:Ser/Plas:Qn:: 27

## 2019-06-22 LAB — CALCIUM: Calcium:MCnc:Pt:Ser/Plas:Qn:: 9.4

## 2019-06-22 LAB — BILIRUBIN TOTAL: Bilirubin:MCnc:Pt:Ser/Plas:Qn:: 0.4

## 2019-06-22 LAB — LIPID PANEL
CHOLESTEROL: 153 mg/dL (ref 100–199)
HDL CHOLESTEROL: 35 mg/dL — ABNORMAL LOW (ref 40–59)
LDL CHOLESTEROL CALCULATED: 60 mg/dL
NON-HDL CHOLESTEROL: 118 mg/dL
TRIGLYCERIDES: 289 mg/dL — ABNORMAL HIGH (ref 1–149)

## 2019-06-22 LAB — ALKALINE PHOSPHATASE: Alkaline phosphatase:CCnc:Pt:Ser/Plas:Qn:: 75

## 2019-06-22 LAB — URIC ACID: Urate:MCnc:Pt:Ser/Plas:Qn:: 4.1

## 2019-06-22 LAB — PROSTATE SPECIFIC ANTIGEN: Prostate specific Ag:MCnc:Pt:Ser/Plas:Qn:: 5.51 — ABNORMAL HIGH

## 2019-06-22 LAB — CHLORIDE: Chloride:SCnc:Pt:Ser/Plas:Qn:: 108 — ABNORMAL HIGH

## 2019-06-22 LAB — EOSINOPHILS ABSOLUTE COUNT: Lab: 0.3

## 2019-06-22 LAB — POTASSIUM
POTASSIUM: 4.1 mmol/L (ref 3.5–5.0)
Potassium:SCnc:Pt:Ser/Plas:Qn:: 4.1

## 2019-06-22 LAB — FREE T4: Thyroxine.free:MCnc:Pt:Ser/Plas:Qn:: 1.26

## 2019-06-22 LAB — EGFR CKD-EPI AA MALE: Lab: >90

## 2019-06-22 LAB — SODIUM
SODIUM: 144 mmol/L (ref 135–145)
Sodium:SCnc:Pt:Ser/Plas:Qn:: 144

## 2019-06-22 LAB — BLOOD UREA NITROGEN: Urea nitrogen:MCnc:Pt:Ser/Plas:Qn:: 13

## 2019-06-22 LAB — ALT (SGPT): Alanine aminotransferase:CCnc:Pt:Ser/Plas:Qn:: 12

## 2019-06-22 LAB — PHOSPHORUS: Phosphate:MCnc:Pt:Ser/Plas:Qn:: 4.1

## 2019-06-22 LAB — CREATINE KINASE TOTAL: Creatine kinase:CCnc:Pt:Ser/Plas:Qn:: 45

## 2019-06-22 LAB — LACTATE DEHYDROGENASE: Lactate dehydrogenase:CCnc:Pt:Ser/Plas:Qn:: 428

## 2019-06-22 LAB — THYROID STIMULATING HORMONE: Thyrotropin:ACnc:Pt:Ser/Plas:Qn:: 11.92 — ABNORMAL HIGH

## 2019-06-22 LAB — MAGNESIUM: Magnesium:MCnc:Pt:Ser/Plas:Qn:: 1.9

## 2019-06-22 LAB — AST (SGOT): Aspartate aminotransferase:CCnc:Pt:Ser/Plas:Qn:: 19

## 2019-06-22 LAB — CREATININE: EGFR CKD-EPI AA MALE: >90 mL/min/{1.73_m2} (ref >=60)

## 2019-06-22 LAB — FASTING

## 2019-06-23 ENCOUNTER — Telehealth: Admit: 2019-06-23 | Discharge: 2019-06-24 | Payer: MEDICARE | Attending: Family | Primary: Family

## 2019-06-23 DIAGNOSIS — C61 Malignant neoplasm of prostate: Secondary | ICD-10-CM

## 2019-06-23 DIAGNOSIS — E039 Hypothyroidism, unspecified: Secondary | ICD-10-CM

## 2019-06-23 NOTE — Unmapped (Signed)
Medical Oncology (Prostate Cancer)    Encounter Description/Consent: This encounter was conducted from provider office via live, face-to-face video conference with the patient. The patient was located in his home.  The patient verified his identity with his date of birth and verbally consented to evaluation and management of his condition through telemedicine.     I spent 20 minutes on the audio/video with the patient. I spent an additional 15 minutes on pre- and post-visit activities.     The patient was physically located in [place] or a state in which I am permitted to provide care. The patient and/or parent/gauardian understood that s/he may incur co-pays and cost sharing, and agreed to the telemedicine visit. The visit was completed via phone and/or video, which was appropriate and reasonable under the circumstances given the patient's presentation at the time.    The patient and/or parent/guardian has been advised of the potential risks and limitations of this mode of treatment (including, but not limited to, the absence of in-person examination) and has agreed to be treated using telemedicine. The patient's/patient's family's questions regarding telemedicine have been answered.     If the phone/video visit was completed in an ambulatory setting, the patient and/or parent/guardian has also been advised to contact their provider???s office for worsening conditions, and seek emergency medical treatment and/or call 911 if the patient deems either necessary.    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 He has been asymptomatic.  He has had RT to the prostate in the past.  He is on clinical trial CPI-1201-201 (ProStar) on the enzalutamide monotherapy arm.  His course was complicated by admission with a GI bleed from diverticulum which was (likely) unrelated to study treatment.      Restaging on 04/07/19 with bone scan and CT CAP showed stable lymphadenopathy and no bone metastases.  PSA decreased with initiation of trial and has remained essentially stable.  Overall these findings suggested stable disease on trial.      No new AEs attributable to study drug.  He is feeling well but has ongoing stable feeling of fatigue and weakness particularly in hips. PSA remains fairly stable. He cares for his 41 yr old grandson who is autistic.     Plan:  - Continue Eligard 45mg  every 6 months (last received 02/02/19).  - Continue CPI-1201-201 (ProStar) trial, enzalutamide monotherapy arm  - Hypothyroid: Seeing Endocrinology.  - I previously asked Radiation Oncology to weigh in about potential RT to the lymph nodes, and they felt this would not be beneficial and would confer toxicity without likely long-term disease control.    - Continue BP meds (started previously w abiraterone).  Continue blood pressure control with his PCP.  - Clinical genetics saw him, negative genomic screening.  - Nocturia: persistent, does not want Flomax.   - Anxiety: On Lexapro - Pharmacy ran interactions for both olaparib and abiraterone.  - Smoking: He understands risks, and the [place] smoking cessation program here has been meeting with him for counseling.  - Bone density: Checked again in 6/19 and although only mildly low, had 10% loss in bone density from 2016-2019 on Lupron and now prednisone.  On Fosamax, calcium/D.    He is DNR/DNI.  His proxy is his wife.    Follow up per trial.     -------------------------------------------------------    Referring physician:  Riki Altes, MD [facility])  Other physician:  Jaynie Collins  [place], MD    CC: Prostate cancer to lymph nodes and possibly bone s/p IMRT to prostate.    HPI:  2010: IMRT to prostate for T1c prostate adenocarcinoma, biopsy with up to Gleason 4+3=7.  Subsequent PSA rise with negative saturation biopsy on 11/17/12.  - 04/05/13: 8.3  - 08/29/13: 12.8  - 02/09/14: 13.3  10/2013: MRI with enlarged left obturator, iliac and para-aortic nodes up to 3 cm consistent with metastatic disease. Bilateral ischia and L pelvis with bony areas <59mm of unclear etiology (bone islands vs. metastases).  10/2013: Lupron started  Subsequent PSA:  - 05/01/14: 1.4  - 08/02/14: 1.4  - 11/03/14: 1.7  01/18/16: Bone scan: no metastases.  01/18/16: CT A/P: 2.6 cm left external iliac chain hyperenhancing node (series 6, image 57). Several prominent subcentimeter lymph nodes are seen along the left para-aortic retroperitoneum.  Prostate is enlarged measuring 6.0 x 6.7 x 9.3 cm. [Possible lipoma in gluteus maximus, or could be related to Lupron injection, discussed w urology, no action warranted.]  10/16/16: restaging:   - CT CAP: Interval increase in size of left external and common iliac chain as well as periaortic lymph nodes, worrisome for metastatic disease.   - Bone Scan: No evidence of osseous metastatic disease.  5/18: Abiraterone/prednisone  6/19: BMD (c/w 2016): Lumbar spine: ??Normal bone density. ??The measurement has decreased significantly since prior study.  Left proximal femur: Mildly low bone density. ??The measurement has decreased significantly since prior study.  04/15/18: PSA 10.6  08/04/18: PSA rising on abiraterone, now 16.7  08/04/18: Bone scan: No evidence of osseous metastasis.  08/04/18: CT CAP: Interval increase in external iliac, periaortic, and aortocaval lymphadenopathy concerning for disease progression.  1/20: Start enzalutamide on trial.  02/02/19: Bone scan restaging: No new abnormal foci of uptake to suggest osseous metastatic disease.  02/02/19: CT CAP: -Multistation abdominopelvic lymphadenopathy, overall stable to slightly decreased in size when compared to CT from 12/08/2018 with the exception of a few bilateral inguinal lymph nodes which are minimally larger in size. -Otherwise, no new sites of metastatic disease in the abdomen or pelvis. -Additional chronic/incidental findings as above, unchanged. 04/07/19: Restaging CT CAP and BS for trial: No bone metastatses seen. Stable multistation abdominopelvic lymphadenopathy.No new sites of metastatic disease in the chest, abdomen or pelvis.    ROS: Nocturia stable at every 2-3 hours, doesn't bother him.  No pain.  Good appetite.  Has tolerated Lupron well.  Remainder of 10 system ROS negative.  Ongoing feeling of fatigue and weakness particularly in legs.    PE:  ECOG 0    Current Outpatient Medications   Medication Sig Dispense Refill   ??? alendronate (FOSAMAX) 70 MG tablet Take 1 tablet (70 mg total) by mouth Every Monday. 51 tablet 0   ??? ascorbic acid, vitamin C, (VITAMIN C) 250 MG tablet Take 250 mg by mouth.     ??? benazepriL (LOTENSIN) 20 MG tablet Take 10 mg by mouth daily. Pt said he was advised to split pills and take 10 mg per day      ??? calcium carbonate/vitamin D3 (CALCIUM 600 + D,3, ORAL) Take 1 capsule by mouth daily.     ??? enzalutamide (XTANDI) 40 mg capsule Take 4 capsules (160 mg total) by mouth daily. 120 capsule 6   ??? escitalopram oxalate (LEXAPRO) 10 MG tablet Take 1 tablet (10 mg total) by mouth daily. 90 tablet 1   ??? hydroCHLOROthiazide (HYDRODIURIL) 12.5 MG tablet Take 6.25 mg by mouth daily.     ???  leuprolide (LUPRON) 22.5 mg injection Inject 22.5 mg into the muscle Every three (3) months.     ??? levothyroxine (SYNTHROID) 88 MCG tablet Take 1 tablet (88 mcg total) by mouth daily. 30 tablet 11   ??? multivitamin (THERAGRAN) per tablet Take 1 tablet by mouth daily.     ??? tamsulosin (FLOMAX) 0.4 mg capsule Take 1 capsule (0.4 mg total) by mouth daily. 90 capsule 3     No current facility-administered medications for this visit.      Allergies   Allergen Reactions   ??? Valium [Diazepam] Other (See Comments)     Hallucinations, altered mental status  Hallucinations, altered mental status           Appointment on 06/22/2019   Component Date Value Ref Range Status   ??? TSH 06/22/2019 11.920* 0.600 - 3.300 uIU/mL Final   ??? Free T4 06/22/2019 1.26  0.71 - 1.40 ng/dL Final   Research Encounter on 06/22/2019   Component Date Value Ref Range Status   ??? Sodium 06/22/2019 144  135 - 145 mmol/L Final   ??? Potassium 06/22/2019 4.1  3.5 - 5.0 mmol/L Final   ??? Chloride 06/22/2019 108* 98 - 107 mmol/L Final   ??? CO2 06/22/2019 27.0  22.0 - 30.0 mmol/L Final   ??? BUN 06/22/2019 13  7 - 21 mg/dL Final   ??? Creatinine 06/22/2019 0.91  0.70 - 1.30 mg/dL Final   ??? EGFR CKD-EPI Non-African American,* 06/22/2019 79  >=60 mL/min/1.86m2 Final   ??? EGFR CKD-EPI African American, Male 06/22/2019 >90  >=60 mL/min/1.59m2 Final   ??? Glucose 06/22/2019 103  70 - 179 mg/dL Final   ??? Calcium 16/06/9603 9.4  8.5 - 10.2 mg/dL Final   ??? Magnesium 54/05/8118 1.9  1.6 - 2.2 mg/dL Final   ??? Phosphorus 06/22/2019 4.1  2.9 - 4.7 mg/dL Final   ??? Albumin 14/78/2956 3.7  3.5 - 5.0 g/dL Final   ??? Total Bilirubin 06/22/2019 0.4  0.0 - 1.2 mg/dL Final   ??? ALT 21/30/8657 12  <50 U/L Final   ??? AST 06/22/2019 19  19 - 55 U/L Final   ??? Alkaline Phosphatase 06/22/2019 75  38 - 126 U/L Final   ??? LDH 06/22/2019 428  338 - 610 U/L Final   ??? Uric Acid 06/22/2019 4.1  4.0 - 9.0 mg/dL Final   ??? Creatine Kinase, Total 06/22/2019 45.0  45.0 - 250.0 U/L Final   ??? Triglycerides 06/22/2019 289* 1 - 149 mg/dL Final   ??? Cholesterol 06/22/2019 153  100 - 199 mg/dL Final   ??? HDL 84/69/6295 35* 40 - 59 mg/dL Final   ??? LDL Calculated 06/22/2019 60  mg/dL Final   ??? VLDL Cholesterol Cal 06/22/2019 57.8  mg/dL Final   ??? Chol/HDL Ratio 06/22/2019 4.4   Final   ??? Non-HDL Cholesterol 06/22/2019 118  mg/dL Final   ??? FASTING 28/41/3244 Unknown   Final   ??? PSA 06/22/2019 5.51* 0.00 - 4.00 ng/mL Final   ??? WBC 06/22/2019 7.2  4.5 - 11.0 10*9/L Final   ??? RBC 06/22/2019 3.77* 4.50 - 5.90 10*12/L Final   ??? HGB 06/22/2019 12.7* 13.5 - 17.5 g/dL Final   ??? HCT 09/24/7251 38.0* 41.0 - 53.0 % Final   ??? MCV 06/22/2019 100.9* 80.0 - 100.0 fL Final   ??? MCH 06/22/2019 33.7  26.0 - 34.0 pg Final   ??? MCHC 06/22/2019 33.4  31.0 - 37.0 g/dL Final   ??? RDW 66/44/0347 15.7* 12.0 -  15.0 % Final   ??? MPV 06/22/2019 6.9* 7.0 - 10.0 fL Final   ??? Platelet 06/22/2019 441* 150 - 440 10*9/L Final   ??? Neutrophils % 06/22/2019 45.4  % Final   ??? Lymphocytes % 06/22/2019 40.7  % Final   ??? Monocytes % 06/22/2019 6.3  % Final   ??? Eosinophils % 06/22/2019 4.3  % Final   ??? Basophils % 06/22/2019 0.6  % Final   ??? Absolute Neutrophils 06/22/2019 3.3  2.0 - 7.5 10*9/L Final   ??? Absolute Lymphocytes 06/22/2019 2.9  1.5 - 5.0 10*9/L Final   ??? Absolute Monocytes 06/22/2019 0.5  0.2 - 0.8 10*9/L Final   ??? Absolute Eosinophils 06/22/2019 0.3  0.0 - 0.4 10*9/L Final   ??? Absolute Basophils 06/22/2019 0.0  0.0 - 0.1 10*9/L Final   ??? Large Unstained Cells 06/22/2019 3  0 - 4 % Final   ??? Macrocytosis 06/22/2019 Slight* Not Present Final       Lab Results   Component Value Date    PSA 5.51 (H) 06/22/2019    PSA 4.68 (H) 05/26/2019    PSA 5.44 (H) 04/29/2019    PSA 5.35 (H) 04/29/2019    PSA 5.01 (H) 04/01/2019    PSA 5.21 (H) 03/04/2019

## 2019-06-23 NOTE — Unmapped (Addendum)
Plan  Scans on Tuesday  Follow up with Dr Vernell Barrier as scheduled

## 2019-06-28 ENCOUNTER — Ambulatory Visit: Admit: 2019-06-28 | Discharge: 2019-06-29 | Payer: MEDICARE

## 2019-06-28 ENCOUNTER — Ambulatory Visit: Admit: 2019-06-28 | Discharge: 2019-07-05 | Payer: MEDICARE

## 2019-06-28 DIAGNOSIS — D1779 Benign lipomatous neoplasm of other sites: Secondary | ICD-10-CM

## 2019-06-28 DIAGNOSIS — M479 Spondylosis, unspecified: Secondary | ICD-10-CM

## 2019-06-28 DIAGNOSIS — C61 Malignant neoplasm of prostate: Secondary | ICD-10-CM

## 2019-06-28 DIAGNOSIS — N200 Calculus of kidney: Secondary | ICD-10-CM

## 2019-06-28 DIAGNOSIS — R590 Localized enlarged lymph nodes: Secondary | ICD-10-CM

## 2019-06-28 DIAGNOSIS — K5730 Diverticulosis of large intestine without perforation or abscess without bleeding: Secondary | ICD-10-CM

## 2019-06-28 DIAGNOSIS — N281 Cyst of kidney, acquired: Secondary | ICD-10-CM

## 2019-06-28 DIAGNOSIS — Z66 Do not resuscitate: Secondary | ICD-10-CM

## 2019-06-28 NOTE — Unmapped (Signed)
Clinical Study: CPI-1205-201: A Phase 1b/2 Study of CPI-1205, a Small Molecule Inhibitor of EZH2, Combined with Enzalutamide or Abiraterone/Prednisone in Patients with Metastatic Castration Resistant Prostate Cancer     Study Treatment: Randomized Arm Phase 2 (Enzalutamide ??? Control Arm)     Date of Service: 01OCT2020     Cycle / Day: C10D1     Performance Status: ECOG PS: 0      Narrative: Patient had phone visit with Ardean Larsen, FNP on 01OCT2020 for C10D1 of CPI-1205 protocol therapy. No physical exam was performed due to COIVD-19 precautions. Patient labs drawn on 30SEP2020 and reviewed by Dr. Vernell Barrier. Gr 1 Hypothyroidism, will continue to monitor. Increased Synthroid to . Complaints of mild to moderate fatigue, not interfering with ADL???s. Pill diaries pending return from patient. Patient randomized to control arm on 21JAN2020.      Plan: Patient will be contacted in 4 weeks for C11D1 on 29OCT2020 with Dr. Vernell Barrier (currently scheduled for in person. Scans to be completed on 06OCT2020. Patient and wife extensively educated on side effects, dosing schedule, medication diary and both verbalized understanding. Contact information for study coordinator and Dr. Vernell Barrier provided and patient agrees to contact office with any questions or concerns.        Enzalutamide Med Compliance Log  Cycle  # Pills Dispensed  # Pills  Returned  # Pills  Dosed # Pills in Planned Dose % Dose Taken  Comments   Cycle 1 120 12 108 108 100    Cycle 2 120 12 108 108 100    Cycle 3 120 24 96 108 88 3 days in hospital ??? Patient error in completing diary   Cycle 4 120 12 108 108 100    Cycle 5 120 12 108 108 100    Cycle 6 120 12 108 108 100    Cycle 7 120        Cycle 8 120        Cycle 9         Cycle 10            Adverse Events Log:     Diagnosis Start Date   Stop Date Grade   Attribution  1: Unrelated 2: Unlikely 3: Possible 4: Probable 5: Definite 6: Unknown CS/NCS       Enzalutamide Prednisone    Hypothyroidism 19MAR2020 Ongoing 1 1 1  NCS   Fatigue 11MAR2020 Ongoing 1 3 1  NCS   Anemia 27MAR2020 Ongoing 1 3 1  NCS   Hypocalcemia 29MAR2020 15APR2020 1 2 1  NCS   Hypertriglyceridemia 15APR2020 13MAY2020 1 4 1  NCS   Hypertriglyceridemia 12JUN2020 Ongoing 1 4 1  NCS   Muscle Weakness 12JUN2020 Ongoing 1 4 1  NCS       Concomitant Medication Log:      Medication Dose/  Route Start Date Stop Date Indication Related to AE?  (yes/no, specify if yes)   Tamsulosin   0.4mg  PO QD 10JAN2020 Ongoing  Nocturia  Med Hx   Alendronate   70mg  PO Q Week 25JUL2019 Ongoing Health Maintenance Med Hx   Aspirin 81mg  PO  QD 30JAN2014 Ongoing Cardiac Prophylaxis Med Hx   Acetaminophen 500mg  PO Q6o PRN [id number] Ongoing Aches/Pains Med Hx   Amlodipine-Benazepril 5/20mg  PO QD 09MAR2018 16JAN2020 Hypertension Med Hx   Escitalopram Oxalate 10mg  PO BID 01FEB2019 Ongoing  Anxiety/Depression Med Hx   Vitamin C   250mg  PO  3 tabs Q8o 01MAR2018 Ongoing General Health Med Hx   Calcium Carbonate 600mg  PO QD  24NOV2019 Ongoing Health Maintenance Med Hx   Prednisone 10mg  PO QD [id number] 07/2018 Prostate CA Med Hx   Abiraterone 250mg  PO 4 tab QD [id number] 07/2018 Prostate CA Med Hx   Multivitamin 1 Tab PO   QD 04FEB2015 Ongoing General Health Med Hx   Leuprolide 45mg  IM 04FEB2015 Ongoing Prostate CA Med Hx   Benazepril HCL 20mg  PO QD 17JAN2020 Ongoing Hypertension Med Hx   Levothyroxine PO QD 19MAR2020 16APR2020 Hypothyroidism A/E   Levothyroxine PO QD 17APR2020 06AUG2020 Hypothyroidism AE   Levothyroxine PO QD 07AUG2020 Ongoing Hypothyroidism AE                      Medical History  Diagnosis Start Date   Stop Date Grade   Clinically significant (yes/no)   Inguinal Hernia [id number] 2 No   Allergic Rhinitis 1995 1995 2 No   Bilateral Lower Extremity Edema 16DEC2019 Ongoing 1 No   Hypertension 03MAR2013 Ongoing 2 No   Nocutria [id number] Ongoing 1 No   Fatigue unAPR2013 Ongoing 1 No   Urinary Urgency [id number] Ongoing 1 No   Urinary Retention unAPR2013 Ongoing 1 No Depression 01FEB2019 Ongoing 2 No   Aches and Pains [id number] Ongoing 2 No   Anxiety 01FEB2019 Ongoing 2 No         Ezzard Flax, LPNII, CCRP  Study Coordinator  Pgr: [phone]

## 2019-06-28 NOTE — Unmapped (Signed)
[facility] Clinical Assessment & Refill Coordination Note    John Watkins, DOB: 06/19/1936  Phone: [phone] (home)     All above HIPAA information was verified with patient.     Specialty Medication(s):   Hematology/Oncology: [name]     Current Outpatient Medications   Medication Sig Dispense Refill   ??? alendronate (FOSAMAX) 70 MG tablet Take 1 tablet (70 mg total) by mouth Every Monday. 51 tablet 0   ??? ascorbic acid, vitamin C, (VITAMIN C) 250 MG tablet Take 250 mg by mouth.     ??? benazepriL (LOTENSIN) 20 MG tablet Take 10 mg by mouth daily. Pt said he was advised to split pills and take 10 mg per day      ??? calcium carbonate/vitamin D3 (CALCIUM 600 + D,3, ORAL) Take 1 capsule by mouth daily.     ??? enzalutamide (XTANDI) 40 mg capsule Take 4 capsules (160 mg total) by mouth daily. 120 capsule 6   ??? escitalopram oxalate (LEXAPRO) 10 MG tablet Take 1 tablet (10 mg total) by mouth daily. 90 tablet 1   ??? hydroCHLOROthiazide (HYDRODIURIL) 12.5 MG tablet Take 6.25 mg by mouth daily.     ??? leuprolide (LUPRON) 22.5 mg injection Inject 22.5 mg into the muscle Every three (3) months.     ??? levothyroxine (SYNTHROID) 88 MCG tablet Take 1 tablet (88 mcg total) by mouth daily. 30 tablet 11   ??? multivitamin (THERAGRAN) per tablet Take 1 tablet by mouth daily.     ??? tamsulosin (FLOMAX) 0.4 mg capsule Take 1 capsule (0.4 mg total) by mouth daily. 90 capsule 3     No current facility-administered medications for this visit.         Changes to medications: Rickey reports no changes at this time.    Allergies   Allergen Reactions   ??? Valium [Diazepam] Other (See Comments)     Hallucinations, altered mental status  Hallucinations, altered mental status         Changes to allergies: No    SPECIALTY MEDICATION ADHERENCE     Xtandi 40 mg: 14 days of medicine on hand     Medication Adherence    Adherence tools used: medication list   Other adherence tool: routine   Support network for adherence: family member          Specialty medication(s) dose(s) confirmed: Regimen is correct and unchanged.     Are there any concerns with adherence? No    Adherence counseling provided? Not needed    CLINICAL MANAGEMENT AND INTERVENTION      Clinical Benefit Assessment:    Do you feel the medicine is effective or helping your condition? Yes    Clinical Benefit counseling provided? Progress note from 05/26/19 shows evidence of clinical benefit    Adverse Effects Assessment:    Are you experiencing any side effects? No    Are you experiencing difficulty administering your medicine? No    Quality of Life Assessment:    How many days over the past month did your prostate cancer  keep you from your normal activities? For example, brushing your teeth or getting up in the morning. 0    Have you discussed this with your provider? Yes    Therapy Appropriateness:    Is therapy appropriate? Yes, therapy is appropriate and should be continued    DISEASE/MEDICATION-SPECIFIC INFORMATION      N/A    PATIENT SPECIFIC NEEDS     ? Does  the patient have any physical, cognitive, or cultural barriers? No    ? Is the patient high risk? No     ? Does the patient require a Care Management Plan? No     ? Does the patient require physician intervention or other additional services (i.e. nutrition, smoking cessation, social work)? No      SHIPPING     Specialty Medication(s) to be Shipped:   Hematology/Oncology: [name]    Other medication(s) to be shipped: none     Changes to insurance: No    Delivery Scheduled: Yes, Expected medication delivery date: 07/07/19.     Medication will be delivered via Same Day Courier to the confirmed home address in [facility].    The patient will receive a drug information handout for each medication shipped and additional FDA Medication Guides as required.  Verified that patient has previously received a Conservation officer, historic buildings.    All of the patient's questions and concerns have been addressed.    Breck Coons Shared [facility] Specialty Pharmacist

## 2019-07-07 MED FILL — XTANDI 40 MG CAPSULE: 30 days supply | Qty: 120 | Fill #2 | Status: AC

## 2019-07-07 MED FILL — XTANDI 40 MG CAPSULE: ORAL | 30 days supply | Qty: 120 | Fill #2

## 2019-07-15 DIAGNOSIS — H2513 Age-related nuclear cataract, bilateral: Secondary | ICD-10-CM | POA: Diagnosis not present

## 2019-07-18 ENCOUNTER — Other Ambulatory Visit: Payer: Self-pay | Admitting: Family Medicine

## 2019-07-18 DIAGNOSIS — F320 Major depressive disorder, single episode, mild: Secondary | ICD-10-CM

## 2019-07-18 DIAGNOSIS — I10 Essential (primary) hypertension: Secondary | ICD-10-CM

## 2019-07-18 DIAGNOSIS — F32A Depression, unspecified: Secondary | ICD-10-CM

## 2019-07-19 ENCOUNTER — Encounter: Payer: Self-pay | Admitting: Family Medicine

## 2019-07-19 ENCOUNTER — Other Ambulatory Visit: Payer: Self-pay

## 2019-07-19 ENCOUNTER — Ambulatory Visit (INDEPENDENT_AMBULATORY_CARE_PROVIDER_SITE_OTHER): Payer: Medicare HMO | Admitting: Family Medicine

## 2019-07-19 DIAGNOSIS — H9193 Unspecified hearing loss, bilateral: Secondary | ICD-10-CM | POA: Diagnosis not present

## 2019-07-19 NOTE — Progress Notes (Signed)
Name: Kevin Donovan   MRN: [id number]    DOB: 1936/06/14   Date:07/19/2019       Progress Note  Subjective  Chief Complaint  Chief Complaint  Patient presents with  . Referral    for ENT, can't hear    I connected with  Kevin Donovan on 07/19/19 at  2:00 PM EDT by telephone and verified that I am speaking with the correct person using two identifiers.   I discussed the limitations, risks, security and privacy concerns of performing an evaluation and management service by telephone and the availability of in person appointments. Staff also discussed with the patient that there may be a patient responsible charge related to this service. Patient Location: Home Provider Location: Office Additional Individuals present: Wife  HPI  Pt presents with his wife to request ENT referral for decreased hearing.  He has been hard of hearing for years, but much worse over the last few months. He did discuss with Dr. Ancil Boozer PCP in May, but he was not ready for referral at that time.  Today he is ready to have evaluation and requests referral.  Does note frequent cerumen build up, wife applies Debrox PRN.  He denies ear pain, no neck pain, no fevers/chills.  Patient Active Problem List   Diagnosis Date Noted  . Abnormal coagulation profile 01/28/2019  . Rectal bleeding 12/17/2018  . Hypothyroidism 12/09/2018  . Essential hypertension 10/22/2018  . Bilateral edema of lower extremity 10/22/2018  . Senile purpura ([place]) 01/28/2018  . Hot flashes 05/28/2017  . Fatigue 05/28/2017  . First degree AV block 11/28/2016  . Bradycardia on ECG 11/14/2016  . Situational depression 10/31/2016  . Calcification of abdominal aorta (HCC) 10/31/2016  . Degenerative disc disease, lumbar 10/31/2016  . Prostate cancer metastatic to intrapelvic lymph node ([place]) 10/23/2016  . Malignant neoplasm of prostate ([place]) 08/27/2012  . Enlarged prostate with lower urinary tract symptoms (LUTS) 08/27/2012    Social  History   Tobacco Use  . Smoking status: Current Every Day Smoker    Packs/day: 0.75    Years: 72.00    Pack years: 54.00    Types: Cigarettes    Start date: 01/29/1944  . Smokeless tobacco: Never Used  Substance Use Topics  . Alcohol use: No     Current Outpatient Medications:  .  aspirin 81 MG tablet, Take 1 tablet by mouth daily., Disp: , Rfl:  .  benazepril (LOTENSIN) 10 MG tablet, TAKE 1 TABLET BY MOUTH ONCE DAILY, Disp: 30 tablet, Rfl: 0 .  escitalopram (LEXAPRO) 10 MG tablet, Take 1 tablet (10 mg total) by mouth daily., Disp: 90 tablet, Rfl: 1 .  leuprolide (LUPRON) 22.5 MG injection, Inject 22.5 mg every 3 (three) months into the muscle. , Disp: , Rfl:  .  levothyroxine (SYNTHROID) 75 MCG tablet, Take 1 tablet by mouth daily., Disp: , Rfl:  .  Multiple Vitamin (MULTI-VITAMINS) TABS, Take by mouth., Disp: , Rfl:  .  tamsulosin (FLOMAX) 0.4 MG CAPS capsule, Take 1 capsule by mouth daily. 30 minutes after largest meal, Disp: , Rfl:  .  vitamin C (ASCORBIC ACID) 250 MG tablet, Take 250 mg by mouth daily., Disp: , Rfl:  .  XTANDI 40 MG capsule, Take 160 mg by mouth daily. , Disp: , Rfl:   Allergies  Allergen Reactions  . Diazepam Other (See Comments)    Hallucinations, altered mental status    I personally reviewed active problem list, medication list, allergies, notes from  last encounter with the patient/caregiver today.  ROS  Ten systems reviewed and is negative except as mentioned in HPI  Objective  Virtual encounter, vitals not obtained.  There is no height or weight on file to calculate BMI.  Nursing Note and Vital Signs reviewed.  Physical Exam  Pulmonary/Chest: Effort normal. No respiratory distress. Speaking in complete sentences Neurological: Pt is alert and oriented to person, place, and time. Coordination, speech and gait are normal.  Psychiatric: Patient has a normal mood and affect. behavior is normal. Judgment and thought content normal.  No results  found for this or any previous visit (from the past 72 hour(s)).  Assessment & Plan  1. Bilateral hearing loss, unspecified hearing loss type - Ambulatory referral to ENT  -Red flags and when to present for emergency care or RTC including fever >101.9F, chest pain, shortness of breath, new/worsening/un-resolving symptoms, reviewed with patient at time of visit. Follow up and care instructions discussed and provided in AVS. - I discussed the assessment and treatment plan with the patient. The patient was provided an opportunity to ask questions and all were answered. The patient agreed with the plan and demonstrated an understanding of the instructions.  - The patient was advised to call back or seek an in-person evaluation if the symptoms worsen or if the condition fails to improve as anticipated.  I provided 8 minutes of non-face-to-face time during this encounter.  Hubbard Hartshorn, FNP

## 2019-07-20 NOTE — Unmapped (Signed)
Medical Oncology (Prostate Cancer)    Encounter Description/Consent: This encounter was conducted from provider office via live, face-to-face video conference with the patient. The patient was located in his home.  The patient verified his identity with his date of birth and verbally consented to evaluation and management of his condition through telemedicine.     I spent 20 minutes on the real-time audio and video with the patient. I spent an additional 15 minutes on pre- and post-visit activities.     The patient was physically located in [place] or a state in which I am permitted to provide care. The patient and/or parent/gauardian understood that s/he may incur co-pays and cost sharing, and agreed to the telemedicine visit. The visit was completed via phone and/or video, which was appropriate and reasonable under the circumstances given the patient's presentation at the time.    The patient and/or parent/guardian has been advised of the potential risks and limitations of this mode of treatment (including, but not limited to, the absence of in-person examination) and has agreed to be treated using telemedicine. The patient's/patient's family's questions regarding telemedicine have been answered.     If the phone/video visit was completed in an ambulatory setting, the patient and/or parent/guardian has also been advised to contact their provider???s office for worsening conditions, and seek emergency medical treatment and/or call 911 if the patient deems either necessary.    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 He has been asymptomatic.  He has had RT to the prostate in the past.  He is on clinical trial CPI-1201-201 (ProStar) on the enzalutamide monotherapy arm.  His course was complicated by admission with a GI bleed from diverticulum which was (likely) unrelated to study treatment. Restaging on 06/28/19 with bone scan and CT CAP showed stable lymphadenopathy and no bone metastases.  PSA decreased with initiation of trial and has remained essentially stable.  Overall these findings suggest stable disease on trial.      No new AEs attributable to study drug.  He is feeling well but has ongoing stable feeling of fatigue and weakness particularly in hips. PSA remains fairly stable. He cares for his 81 yr old grandson who is autistic.     Plan:  - Continue Eligard 45mg  every 6 months   - Continue CPI-1201-201 (ProStar) trial, enzalutamide monotherapy arm.  - Hypothyroid: Seeing Endocrinology.  - I previously asked Radiation Oncology to weigh in about potential RT to the lymph nodes, and they felt this would not be beneficial and would confer toxicity without likely long-term disease control.    - Continue BP meds (started previously w abiraterone).  Continue blood pressure control with his PCP.  - Clinical genetics saw him, negative genomic screening.  - Nocturia: persistent, does not want Flomax.   - Anxiety: On Lexapro - Pharmacy ran interactions for both olaparib and abiraterone.  - Smoking: He understands risks, and the [facility] smoking cessation program here has been meeting with him for counseling.  - Bone density: Checked again in 6/19 and although only mildly low, had 10% loss in bone density from 2016-2019 on Lupron and now prednisone.  On Fosamax, calcium/D.    He is DNR/DNI.  His proxy is his wife.    Follow up per trial.     -------------------------------------------------------    Referring physician:  Riki Altes, MD [facility])  Other physician:  Irven Easterly, MD  CC: Prostate cancer to lymph nodes and possibly bone s/p IMRT to prostate.    HPI:  2010: IMRT to prostate for T1c prostate adenocarcinoma, biopsy with up to Gleason 4+3=7.  Subsequent PSA rise with negative saturation biopsy on 11/17/12.  - 04/05/13: 8.3  - 08/29/13: 12.8  - 02/09/14: 13.3 10/2013: MRI with enlarged left obturator, iliac and para-aortic nodes up to 3 cm consistent with metastatic disease. Bilateral ischia and L pelvis with bony areas <70mm of unclear etiology (bone islands vs. metastases).  10/2013: Lupron started  Subsequent PSA:  - 05/01/14: 1.4  - 08/02/14: 1.4  - 11/03/14: 1.7  01/18/16: Bone scan: no metastases.  01/18/16: CT A/P: 2.6 cm left external iliac chain hyperenhancing node (series 6, image 57). Several prominent subcentimeter lymph nodes are seen along the left para-aortic retroperitoneum.  Prostate is enlarged measuring 6.0 x 6.7 x 9.3 cm. [Possible lipoma in gluteus maximus, or could be related to Lupron injection, discussed w urology, no action warranted.]  10/16/16: restaging:   - CT CAP: Interval increase in size of left external and common iliac chain as well as periaortic lymph nodes, worrisome for metastatic disease.   - Bone Scan: No evidence of osseous metastatic disease.  5/18: Abiraterone/prednisone  6/19: BMD (c/w 2016): Lumbar spine: ??Normal bone density. ??The measurement has decreased significantly since prior study.  Left proximal femur: Mildly low bone density. ??The measurement has decreased significantly since prior study.  04/15/18: PSA 10.6  08/04/18: PSA rising on abiraterone, now 16.7  08/04/18: Bone scan: No evidence of osseous metastasis.  08/04/18: CT CAP: Interval increase in external iliac, periaortic, and aortocaval lymphadenopathy concerning for disease progression.  1/20: Start enzalutamide on trial.  02/02/19: Bone scan restaging: No new abnormal foci of uptake to suggest osseous metastatic disease. 02/02/19: CT CAP: -Multistation abdominopelvic lymphadenopathy, overall stable to slightly decreased in size when compared to CT from 12/08/2018 with the exception of a few bilateral inguinal lymph nodes which are minimally larger in size. -Otherwise, no new sites of metastatic disease in the abdomen or pelvis. -Additional chronic/incidental findings as above, unchanged.  04/07/19: Restaging CT CAP and BS for trial: No bone metastatses seen. Stable multistation abdominopelvic lymphadenopathy.No new sites of metastatic disease in the chest, abdomen or pelvis.  06/28/19: Restaging BS: - No evidence of osseous metastatic disease.  06/28/19: Restaging CT: Grossly stable multistation abdominopelvic lymphadenopathy. No new sites of metastatic disease in the abdomen or pelvis.    ROS: Nocturia stable at every 2-3 hours, doesn't bother him.  No pain.  Good appetite.  Has tolerated Lupron well.  Remainder of 10 system ROS negative.  Ongoing feeling of fatigue and weakness particularly in legs.    PE:  ECOG 0  A+Ox3  NAD  No rash  Neuro non focal  No LEE    Current Outpatient Medications   Medication Sig Dispense Refill   ??? alendronate (FOSAMAX) 70 MG tablet Take 1 tablet (70 mg total) by mouth Every Monday. 51 tablet 0   ??? ascorbic acid, vitamin C, (VITAMIN C) 250 MG tablet Take 250 mg by mouth.     ??? benazepriL (LOTENSIN) 20 MG tablet Take 10 mg by mouth daily. Pt said he was advised to split pills and take 10 mg per day      ??? calcium carbonate/vitamin D3 (CALCIUM 600 + D,3, ORAL) Take 1 capsule by mouth daily.     ??? enzalutamide (XTANDI) 40 mg capsule Take 4 capsules (160 mg total) by mouth daily.  120 capsule 6   ??? escitalopram oxalate (LEXAPRO) 10 MG tablet Take 1 tablet (10 mg total) by mouth daily. 90 tablet 1   ??? hydroCHLOROthiazide (HYDRODIURIL) 12.5 MG tablet Take 6.25 mg by mouth daily.     ??? leuprolide (LUPRON) 22.5 mg injection Inject 22.5 mg into the muscle Every three (3) months. ??? levothyroxine (SYNTHROID) 88 MCG tablet Take 1 tablet (88 mcg total) by mouth daily. 30 tablet 11   ??? multivitamin (THERAGRAN) per tablet Take 1 tablet by mouth daily.     ??? tamsulosin (FLOMAX) 0.4 mg capsule Take 1 capsule (0.4 mg total) by mouth daily. 90 capsule 3     No current facility-administered medications for this visit.      Allergies   Allergen Reactions   ??? Valium [Diazepam] Other (See Comments)     Hallucinations, altered mental status  Hallucinations, altered mental status           No visits with results within 2 Week(s) from this visit.   Latest known visit with results is:   Appointment on 06/22/2019   Component Date Value Ref Range Status   ??? TSH 06/22/2019 11.920* 0.600 - 3.300 uIU/mL Final   ??? Free T4 06/22/2019 1.26  0.71 - 1.40 ng/dL Final       Lab Results   Component Value Date    PSA 5.51 (H) 06/22/2019    PSA 4.68 (H) 05/26/2019    PSA 5.44 (H) 04/29/2019    PSA 5.35 (H) 04/29/2019    PSA 5.01 (H) 04/01/2019    PSA 5.21 (H) 03/04/2019

## 2019-07-20 NOTE — Unmapped (Signed)
I spoke with patient John Watkins to confirm appointments on the following date(s): 11/13 at 7:30am for Eligard injection in [facility].      Estevan Oaks

## 2019-07-21 ENCOUNTER — Telehealth: Admit: 2019-07-21 | Discharge: 2019-07-22 | Payer: MEDICARE | Attending: Medical Oncology | Primary: Medical Oncology

## 2019-07-21 NOTE — Unmapped (Signed)
Nice seeing you today.   If you have any questions please call the Nurse Navigator in the office at [phone], or [phone]) [phone].  Frederic Jericho, MD

## 2019-07-27 NOTE — Unmapped (Signed)
[facility] Refill Coordination Note    Specialty Medication(s) to be Shipped:   Hematology/Oncology: John Watkins    Other medication(s) to be shipped:       John Watkins, DOB: [birth year]  Phone: [phone] (home)       All above HIPAA information was verified with patient.     Completed refill call assessment today to schedule patient's medication shipment from the [facility] [phone]).       Specialty medication(s) and dose(s) confirmed: Regimen is correct and unchanged.   Changes to medications: John Watkins reports no changes at this time.  Changes to insurance: No  Questions for the pharmacist: No    Confirmed patient received Welcome Packet with first shipment. The patient will receive a drug information handout for each medication shipped and additional FDA Medication Guides as required.       DISEASE/MEDICATION-SPECIFIC INFORMATION        N/A    SPECIALTY MEDICATION ADHERENCE     Medication Adherence    Patient reported X missed doses in the last month: 0  Specialty Medication: xtandi 40mg   Patient is on additional specialty medications: No  Informant: patient  Adherence tools used: medication list   Other adherence tool: routine   Support network for adherence: family member                XTANDI 40 mg: 12 days of medicine on hand         SHIPPING     Shipping address confirmed in Epic.     Delivery Scheduled: Yes, Expected medication delivery date: 11/11.     Medication will be delivered via Next Day Courier to the prescription address in Epic WAM.    Antonietta Barcelona   [facility] Specialty Technician

## 2019-07-28 DIAGNOSIS — H6123 Impacted cerumen, bilateral: Secondary | ICD-10-CM | POA: Diagnosis not present

## 2019-07-28 DIAGNOSIS — H6983 Other specified disorders of Eustachian tube, bilateral: Secondary | ICD-10-CM | POA: Diagnosis not present

## 2019-07-28 DIAGNOSIS — T444X5A Adverse effect of predominantly alpha-adrenoreceptor agonists, initial encounter: Secondary | ICD-10-CM | POA: Diagnosis not present

## 2019-07-28 DIAGNOSIS — H903 Sensorineural hearing loss, bilateral: Secondary | ICD-10-CM | POA: Diagnosis not present

## 2019-07-28 NOTE — Unmapped (Signed)
This encounter was created in error - please disregard.

## 2019-08-02 DIAGNOSIS — C775 Secondary and unspecified malignant neoplasm of intrapelvic lymph nodes: Principal | ICD-10-CM

## 2019-08-02 DIAGNOSIS — C61 Malignant neoplasm of prostate: Principal | ICD-10-CM

## 2019-08-03 NOTE — Unmapped (Signed)
John Watkins 's Xtandi shipment will be delayed as a result of a high copay.     I have reached out to the patient and was unable to leave a message. We will call the patient back to reschedule the delivery upon resolution. We have not confirmed the new delivery date.

## 2019-08-05 ENCOUNTER — Institutional Professional Consult (permissible substitution): Admit: 2019-08-05 | Discharge: 2019-08-06 | Payer: MEDICARE

## 2019-08-05 DIAGNOSIS — C775 Secondary and unspecified malignant neoplasm of intrapelvic lymph nodes: Secondary | ICD-10-CM | POA: Diagnosis not present

## 2019-08-05 DIAGNOSIS — Z66 Do not resuscitate: Secondary | ICD-10-CM | POA: Diagnosis not present

## 2019-08-05 DIAGNOSIS — Z5111 Encounter for antineoplastic chemotherapy: Secondary | ICD-10-CM | POA: Diagnosis not present

## 2019-08-05 DIAGNOSIS — C61 Malignant neoplasm of prostate: Secondary | ICD-10-CM | POA: Diagnosis not present

## 2019-08-05 NOTE — Unmapped (Signed)
Pt received eligard injection in right lower abdomen - pt tolerated well.    AVS received at scheduling.    Pt stable and ambulated independently from clinic.

## 2019-08-09 DIAGNOSIS — C61 Malignant neoplasm of prostate: Principal | ICD-10-CM

## 2019-08-11 ENCOUNTER — Other Ambulatory Visit: Payer: Self-pay | Admitting: Family Medicine

## 2019-08-11 ENCOUNTER — Telehealth: Payer: Self-pay | Admitting: Family Medicine

## 2019-08-11 DIAGNOSIS — I10 Essential (primary) hypertension: Secondary | ICD-10-CM

## 2019-08-11 NOTE — Telephone Encounter (Signed)
Requested medication (s) are due for refill today: yes  Requested medication (s) are on the active medication list: yes  Last refill:  03/20/2019  Future visit scheduled: no  Notes to clinic:  ACE inhibitors failed. K and Cr failed, not in normal range within 180 days.  Requested Prescriptions  Pending Prescriptions Disp Refills   benazepril (LOTENSIN) 10 MG tablet [Pharmacy Med Name: BENAZEPRIL HCL 10 MG TAB] 30 tablet 0    Sig: TAKE 1 TABLET BY MOUTH ONCE DAILY     Cardiovascular:  ACE Inhibitors Failed - 08/11/2019  3:28 PM      Failed - Cr in normal range and within 180 days    Creat  Date Value Ref Range Status  07/07/2017 0.82 0.70 - 1.18 mg/dL Final    Comment:    For patients >40 years of age, the reference limit for Creatinine is approximately 13% higher for people identified as African-American. .          Failed - K in normal range and within 180 days    Potassium  Date Value Ref Range Status  07/07/2017 3.3 (L) 3.5 - 5.3 mmol/L Final         Passed - Patient is not pregnant      Passed - Last BP in normal range    BP Readings from Last 1 Encounters:  02/28/19 (!) 138/56         Passed - Valid encounter within last 6 months    Recent Outpatient Visits          3 weeks ago Bilateral hearing loss, unspecified hearing loss type   [facility] Hartshorn, FNP   6 months ago Essential hypertension   [facility] Steele Sizer, MD   9 months ago Essential hypertension   [place], FNP   10 months ago Hypotension, unspecified hypotension type   [facility] Hartshorn, FNP   1 year ago Essential hypertension   [facility] Steele Sizer, MD

## 2019-08-11 NOTE — Telephone Encounter (Signed)
Pt needs refill on BP meds, Benazepril  to be sent to [facility] in [place]

## 2019-08-11 NOTE — Unmapped (Signed)
Left message as reminder that it is required that he get his labs done on Monday for his appointment with [facility].     It is also important that he get his vitals done at the lab for report here.     Ezzard Flax, LPNII, CCRP  Study Coordinator  Ph: [phone]

## 2019-08-11 NOTE — Unmapped (Signed)
This patient has been disenrolled from the [facility] specialty pharmacy services due to a pharmacy change. The patient is now filling at [facility] via [facility] assistance program.    Kermit Balo  [facility] [place] Specialty Pharmacist

## 2019-08-11 NOTE — Unmapped (Signed)
John Watkins 's [name] shipment will be canceled  as a result of no longer being eligible to fill at [facility] pharmacy.     I have reached out to the patient and communicated the delivery change. We will not reschedule the medication due to PATIENT APPROVED FOR MEDICATION THROUGH MANUFACT[employer] AND WIFE SAID THAT HIS FIRST DELIVERY IS SCHEDULED FOR TOMORROW and have removed this/these medication(s) from the work request.  We have canceled this work request.

## 2019-08-12 NOTE — Telephone Encounter (Signed)
Pt has a telephone appt on Tuesday 08/16/2019 @ 9 with [place]. Pt is requesting for meds to be called in because pt only has 3 pills left.

## 2019-08-15 ENCOUNTER — Ambulatory Visit: Admit: 2019-08-15 | Discharge: 2019-08-15 | Payer: MEDICARE

## 2019-08-15 ENCOUNTER — Encounter: Admit: 2019-08-15 | Discharge: 2019-08-15 | Payer: MEDICARE | Attending: Medical Oncology | Primary: Medical Oncology

## 2019-08-15 ENCOUNTER — Telehealth: Admit: 2019-08-15 | Discharge: 2019-08-15 | Payer: MEDICARE | Attending: Family | Primary: Family

## 2019-08-15 DIAGNOSIS — R791 Abnormal coagulation profile: Secondary | ICD-10-CM | POA: Diagnosis not present

## 2019-08-15 DIAGNOSIS — C775 Secondary and unspecified malignant neoplasm of intrapelvic lymph nodes: Secondary | ICD-10-CM | POA: Diagnosis not present

## 2019-08-15 DIAGNOSIS — R946 Abnormal results of thyroid function studies: Secondary | ICD-10-CM | POA: Diagnosis not present

## 2019-08-15 DIAGNOSIS — R799 Abnormal finding of blood chemistry, unspecified: Secondary | ICD-10-CM | POA: Diagnosis not present

## 2019-08-15 DIAGNOSIS — C61 Malignant neoplasm of prostate: Secondary | ICD-10-CM | POA: Diagnosis not present

## 2019-08-15 MED ORDER — BENAZEPRIL HCL 10 MG PO TABS: 10.0000 mg | ORAL_TABLET | Freq: Every day | ORAL | 0 refills | Status: DC

## 2019-08-15 NOTE — Addendum Note (Signed)
Addended by: Steele Sizer F on: 08/15/2019 01:19 PM   Modules accepted: Orders

## 2019-08-16 ENCOUNTER — Ambulatory Visit (INDEPENDENT_AMBULATORY_CARE_PROVIDER_SITE_OTHER): Payer: Medicare HMO | Admitting: Family Medicine

## 2019-08-16 ENCOUNTER — Other Ambulatory Visit: Payer: Self-pay

## 2019-08-16 ENCOUNTER — Encounter: Payer: Self-pay | Admitting: Family Medicine

## 2019-08-16 VITALS — BP 142/86 | HR 83 | Ht 70.0 in | Wt 184.0 lb

## 2019-08-16 DIAGNOSIS — C775 Secondary and unspecified malignant neoplasm of intrapelvic lymph nodes: Secondary | ICD-10-CM

## 2019-08-16 DIAGNOSIS — F320 Major depressive disorder, single episode, mild: Secondary | ICD-10-CM

## 2019-08-16 DIAGNOSIS — H9193 Unspecified hearing loss, bilateral: Secondary | ICD-10-CM

## 2019-08-16 DIAGNOSIS — C61 Malignant neoplasm of prostate: Secondary | ICD-10-CM | POA: Diagnosis not present

## 2019-08-16 DIAGNOSIS — I10 Essential (primary) hypertension: Secondary | ICD-10-CM | POA: Diagnosis not present

## 2019-08-16 DIAGNOSIS — F32A Depression, unspecified: Secondary | ICD-10-CM

## 2019-08-16 DIAGNOSIS — Z720 Tobacco use: Secondary | ICD-10-CM

## 2019-08-16 DIAGNOSIS — I700 Atherosclerosis of aorta: Secondary | ICD-10-CM | POA: Diagnosis not present

## 2019-08-16 DIAGNOSIS — D692 Other nonthrombocytopenic purpura: Secondary | ICD-10-CM | POA: Diagnosis not present

## 2019-08-16 DIAGNOSIS — R69 Illness, unspecified: Secondary | ICD-10-CM | POA: Diagnosis not present

## 2019-08-16 MED ORDER — BENAZEPRIL HCL 10 MG PO TABS: 10.0000 mg | ORAL_TABLET | Freq: Every day | ORAL | 1 refills | Status: DC

## 2019-08-16 MED ORDER — ESCITALOPRAM OXALATE 10 MG PO TABS: 10.0000 mg | ORAL_TABLET | Freq: Every day | ORAL | 1 refills | Status: DC

## 2019-08-16 NOTE — Progress Notes (Signed)
Name: Kevin Donovan   MRN: [id number]    DOB: 07-Aug-1936   Date:08/16/2019       Progress Note  Subjective  Chief Complaint  Chief Complaint  Patient presents with  . Medication Refill  . Hypertension    Denies any symptoms  . Depression  . Hypothyroidism  . Prostate Cancer  . Calcification abdominal aorta    I connected with  Hewitt Blade Memmott  on 08/16/19 at  9:00 AM EST by a video enabled telemedicine application and verified that I am speaking with the correct person using two identifiers.  I discussed the limitations of evaluation and management by telemedicine and the availability of in person appointments. The patient expressed understanding and agreed to proceed. Staff also discussed with the patient that there may be a patient responsible charge related to this service. Patient Location: at home  Provider Location: [facility]   HPI  HTN: bp spiked during use of Zytiga  medication ,but bp is stable now , usually controlled, today he was little irritated with grandsons school work. Taking lisinopril daily and denies side effects  He states he still has hctz at home but only takes for swelling prn, not recently   Prostate Cancer: he was diagnosed in 2008 in 2018diagnosed with metastatic cancer, and resumed therapy May 2018,he did not respond to Zytiga , lupron is on hold because of COVID-19, currently on Xtandi and denies any side effects. He is tolerating medication well.   Hypothyroidism: taking levothyroxine and managed at [facility], he denies palpitation, change in bowel movements or dry skin  , last TSH was done and it was 12.6 on 08/15/2019, he is currently on 88 mcg daily   Senile purpura: stable  Calcification abdominal aorta: found on CT abdomen, he has metastatic prostate cancer, he as bleeding and aspirin was stopped because of rectal bleed. Not interested in taking statin therapy.  Depression: he states that since he started on medication  ( Lexapro ) he has not noticed any difference in his behavior, however his wife has noticed a great improvement in his mood. Unchanged, he wants to continue medications  Bradycardia on EKG: done at [facility], but no change since 2014.Stable   Dyslipidemia: reviewed labs done [facility], done 05/2019. TC 153, HDL 35, triglycerides 289 ( he states not fasting), LDL 60. Discussed ways to improve HDL levels  Tobacco: he is not ready to quit smoking yet, he isstillsmokingbetween 3/4 -1 pack daily.He has a daily cough sometimes productive, no wheezing and no SOB, discussed spirometry and he refuses. Unchanged   Hearing loss: he saw ENT and had some wax removed and had hearing test and was advised to get a hearing aid, but he is not interested   Patient Active Problem List   Diagnosis Date Noted  . Abnormal coagulation profile 01/28/2019  . Rectal bleeding 12/17/2018  . Hypothyroidism 12/09/2018  . Essential hypertension 10/22/2018  . Bilateral edema of lower extremity 10/22/2018  . Senile purpura ([place]) 01/28/2018  . Hot flashes 05/28/2017  . Fatigue 05/28/2017  . First degree AV block 11/28/2016  . Bradycardia on ECG 11/14/2016  . Situational depression 10/31/2016  . Calcification of abdominal aorta (HCC) 10/31/2016  . Degenerative disc disease, lumbar 10/31/2016  . Prostate cancer metastatic to intrapelvic lymph node ([place]) 10/23/2016  . Malignant neoplasm of prostate ([place]) 08/27/2012  . Enlarged prostate with lower urinary tract symptoms (LUTS) 08/27/2012    Past Surgical History:  Procedure Laterality Date  . APPENDECTOMY  2011  . COLONOSCOPY  2005    Family History  Problem Relation Age of Onset  . Lupus Mother   . Heart disease Mother        CHF  . Heart disease Father     Social History   Socioeconomic History  . Marital status: Married    Spouse name: [name]  . Number of children: 1  . Years of education: Not on file  . Highest education level: Some college, no degree   Occupational History  . Not on file  Social Needs  . Financial resource strain: Not hard at all  . Food insecurity    Worry: Never true    Inability: Never true  . Transportation needs    Medical: No    Non-medical: No  Tobacco Use  . Smoking status: Current Every Day Smoker    Packs/day: 0.75    Years: 72.00    Pack years: 54.00    Types: Cigarettes    Start date: 01/29/1944  . Smokeless tobacco: Never Used  Substance and Sexual Activity  . Alcohol use: No  . Drug use: No  . Sexual activity: Yes    Partners: Female  Lifestyle  . Physical activity    Days per week: 7 days    Minutes per session: 150+ min  . Stress: Not at all  Relationships  . Social Herbalist on phone: Three times a week    Gets together: Once a week    Attends religious service: Never    Active member of club or organization: No    Attends meetings of clubs or organizations: Never    Relationship status: Married  . Intimate partner violence    Fear of current or ex partner: No    Emotionally abused: No    Physically abused: No    Forced sexual activity: No  Other Topics Concern  . Not on file  Social History Narrative  . Not on file     Current Outpatient Medications:  .  alendronate (FOSAMAX) 70 MG tablet, Take 70 mg by mouth once a week., Disp: , Rfl:  .  aspirin 81 MG tablet, Take 1 tablet by mouth daily., Disp: , Rfl:  .  benazepril (LOTENSIN) 10 MG tablet, Take 1 tablet (10 mg total) by mouth daily., Disp: 30 tablet, Rfl: 0 .  escitalopram (LEXAPRO) 10 MG tablet, Take 1 tablet (10 mg total) by mouth daily., Disp: 90 tablet, Rfl: 1 .  leuprolide (LUPRON) 22.5 MG injection, Inject 22.5 mg into the muscle every 6 (six) months. , Disp: , Rfl:  .  levothyroxine (SYNTHROID) 88 MCG tablet, Take 88 mcg by mouth daily., Disp: , Rfl:  .  Multiple Vitamin (MULTI-VITAMINS) TABS, Take by mouth., Disp: , Rfl:  .  tamsulosin (FLOMAX) 0.4 MG CAPS capsule, Take 1 capsule by mouth daily. 30  minutes after largest meal, Disp: , Rfl:  .  vitamin C (ASCORBIC ACID) 250 MG tablet, Take 250 mg by mouth daily., Disp: , Rfl:  .  XTANDI 40 MG capsule, Take 160 mg by mouth daily. , Disp: , Rfl:  .  hydrochlorothiazide (HYDRODIURIL) 12.5 MG tablet, Take 12.5 mg by mouth as needed ([place]). , Disp: , Rfl:  .  levothyroxine (SYNTHROID) 75 MCG tablet, Take 1 tablet by mouth daily., Disp: , Rfl:   Allergies  Allergen Reactions  . Diazepam Other (See Comments)    Hallucinations, altered mental status    I personally reviewed active  problem list, medication list, allergies, family history, social history, health maintenance with the patient/caregiver today.   ROS  Ten systems reviewed and is negative except as mentioned in HPI   Objective  Virtual encounter, vitals  Obtained at home  Vitals:   08/16/19 0847  BP: (!) 142/86  Pulse: 83    Body mass index is 26.4 kg/m.  Physical Exam  Awake, alert and oriented  PHQ2/9: Depression screen [facility] 2/9 08/16/2019 07/19/2019 01/28/2019 10/22/2018 10/07/2018  Decreased Interest 0 1 1 0 0  Down, Depressed, Hopeless 0 1 0 0 0  PHQ - 2 Score 0 2 1 0 0  Altered sleeping 0 0 0 0 0  Tired, decreased energy 1 1 2  0 0  Change in appetite 0 0 0 0 0  Feeling bad or failure about yourself  0 0 0 0 0  Trouble concentrating 0 1 0 0 0  Moving slowly or fidgety/restless 0 0 0 0 0  Suicidal thoughts 0 0 0 0 0  PHQ-9 Score 1 4 3  0 0  Difficult doing work/chores Not difficult at all Not difficult at all Not difficult at all Not difficult at all Not difficult at all   PHQ-2/9 Result is negative.    Fall Risk: Fall Risk  07/19/2019 01/28/2019 10/22/2018 10/07/2018 07/30/2018  Falls in the past year? 1 0 0 0 0  Number falls in past yr: 1 0 0 0 -  Injury with Fall? 0 0 0 0 0  Follow up Falls evaluation completed - Falls evaluation completed Falls evaluation completed -     Assessment & Plan  1. Essential hypertension  - benazepril (LOTENSIN) 10 MG  tablet; Take 1 tablet (10 mg total) by mouth daily.  Dispense: 90 tablet; Refill: 1  2. Calcification of abdominal aorta (HCC)  On bp medication   3. Prostate cancer metastatic to intrapelvic lymph node ([place])  [facility] follow up at [facility]   4. Bilateral hearing loss, unspecified hearing loss type   he is not ready for hearing aids, he states too expensive   5. Senile purpura (HCC)  stable  6. Tobacco abuse  Not ready to quit   7. Mild depression (HCC)  - escitalopram (LEXAPRO) 10 MG tablet; Take 1 tablet (10 mg total) by mouth daily.  Dispense: 90 tablet; Refill: 1  I discussed the assessment and treatment plan with the patient. The patient was provided an opportunity to ask questions and all were answered. The patient agreed with the plan and demonstrated an understanding of the instructions.  The patient was advised to call back or seek an in-person evaluation if the symptoms worsen or if the condition fails to improve as anticipated.   I provided 25 minutes of non-face-to-face time during this encounter.

## 2019-08-29 MED ORDER — LEVOTHYROXINE 100 MCG TABLET: 100 ug | tablet | Freq: Every day | 11 refills | 30 days | Status: AC

## 2019-08-29 MED ORDER — LEVOTHYROXINE 100 MCG TABLET
ORAL_TABLET | Freq: Every day | ORAL | 11 refills | 30.00000 days | Status: CP
Start: 2019-08-29 — End: 2020-08-28

## 2019-09-08 ENCOUNTER — Telehealth: Admit: 2019-09-08 | Discharge: 2019-09-09 | Payer: MEDICARE | Attending: Family | Primary: Family

## 2019-09-12 ENCOUNTER — Ambulatory Visit: Admit: 2019-09-12 | Discharge: 2019-09-13 | Payer: MEDICARE

## 2019-09-12 ENCOUNTER — Other Ambulatory Visit: Admit: 2019-09-12 | Discharge: 2019-09-13 | Payer: MEDICARE

## 2019-09-12 ENCOUNTER — Ambulatory Visit: Admit: 2019-09-12 | Discharge: 2019-09-27 | Payer: MEDICARE

## 2019-09-12 DIAGNOSIS — C775 Secondary and unspecified malignant neoplasm of intrapelvic lymph nodes: Secondary | ICD-10-CM

## 2019-09-12 DIAGNOSIS — R791 Abnormal coagulation profile: Principal | ICD-10-CM

## 2019-09-12 DIAGNOSIS — R946 Abnormal results of thyroid function studies: Principal | ICD-10-CM

## 2019-09-12 DIAGNOSIS — C61 Malignant neoplasm of prostate: Principal | ICD-10-CM

## 2019-09-12 DIAGNOSIS — R799 Abnormal finding of blood chemistry, unspecified: Principal | ICD-10-CM

## 2019-09-30 DIAGNOSIS — R351 Nocturia: Principal | ICD-10-CM

## 2019-09-30 MED ORDER — TAMSULOSIN 0.4 MG CAPSULE
ORAL_CAPSULE | Freq: Every day | ORAL | 3 refills | 90 days | Status: CP
Start: 2019-09-30 — End: 2020-09-29

## 2019-10-03 DIAGNOSIS — C61 Malignant neoplasm of prostate: Principal | ICD-10-CM

## 2019-10-03 DIAGNOSIS — C775 Secondary and unspecified malignant neoplasm of intrapelvic lymph nodes: Principal | ICD-10-CM

## 2019-10-11 ENCOUNTER — Ambulatory Visit: Admit: 2019-10-11 | Discharge: 2019-10-12 | Payer: MEDICARE

## 2019-10-13 ENCOUNTER — Ambulatory Visit: Admit: 2019-10-13 | Discharge: 2019-10-13 | Payer: MEDICARE

## 2019-10-13 ENCOUNTER — Telehealth: Admit: 2019-10-13 | Discharge: 2019-10-13 | Payer: MEDICARE | Attending: Medical Oncology | Primary: Medical Oncology

## 2019-10-13 DIAGNOSIS — C61 Malignant neoplasm of prostate: Principal | ICD-10-CM

## 2019-10-13 DIAGNOSIS — C775 Secondary and unspecified malignant neoplasm of intrapelvic lymph nodes: Secondary | ICD-10-CM

## 2019-10-13 DIAGNOSIS — E039 Hypothyroidism, unspecified: Principal | ICD-10-CM

## 2019-10-25 DIAGNOSIS — E039 Hypothyroidism, unspecified: Principal | ICD-10-CM

## 2019-10-31 ENCOUNTER — Telehealth: Payer: Self-pay | Admitting: Emergency Medicine

## 2019-10-31 NOTE — Telephone Encounter (Signed)
Patients wife called and ask will you follow his thyroid medication/condition. Oncologist has been keeping a check on it but was told he need to see Endocrinology or check with PCP

## 2019-11-02 NOTE — Telephone Encounter (Signed)
Patient notified to make appointment. Please call and make appointment

## 2019-11-02 NOTE — Telephone Encounter (Signed)
Appt made

## 2019-11-11 ENCOUNTER — Ambulatory Visit: Admit: 2019-11-11 | Discharge: 2019-11-12 | Payer: MEDICARE

## 2019-11-15 ENCOUNTER — Ambulatory Visit: Payer: Medicare HMO | Admitting: Family Medicine

## 2019-11-15 ENCOUNTER — Encounter: Payer: Self-pay | Admitting: Family Medicine

## 2019-11-15 ENCOUNTER — Other Ambulatory Visit: Payer: Self-pay

## 2019-11-15 VITALS — BP 134/78 | HR 78 | Temp 97.5°F | Resp 16 | Ht 70.0 in | Wt 185.7 lb

## 2019-11-15 DIAGNOSIS — M810 Age-related osteoporosis without current pathological fracture: Secondary | ICD-10-CM

## 2019-11-15 DIAGNOSIS — D692 Other nonthrombocytopenic purpura: Secondary | ICD-10-CM | POA: Diagnosis not present

## 2019-11-15 DIAGNOSIS — I700 Atherosclerosis of aorta: Secondary | ICD-10-CM | POA: Diagnosis not present

## 2019-11-15 DIAGNOSIS — I739 Peripheral vascular disease, unspecified: Secondary | ICD-10-CM

## 2019-11-15 DIAGNOSIS — C61 Malignant neoplasm of prostate: Secondary | ICD-10-CM

## 2019-11-15 DIAGNOSIS — I10 Essential (primary) hypertension: Secondary | ICD-10-CM

## 2019-11-15 DIAGNOSIS — F32A Depression, unspecified: Secondary | ICD-10-CM

## 2019-11-15 DIAGNOSIS — R600 Localized edema: Secondary | ICD-10-CM

## 2019-11-15 DIAGNOSIS — F320 Major depressive disorder, single episode, mild: Secondary | ICD-10-CM

## 2019-11-15 DIAGNOSIS — E039 Hypothyroidism, unspecified: Secondary | ICD-10-CM

## 2019-11-15 DIAGNOSIS — C775 Secondary and unspecified malignant neoplasm of intrapelvic lymph nodes: Secondary | ICD-10-CM

## 2019-11-15 NOTE — Progress Notes (Signed)
Name: Kevin Donovan   MRN: [id number]    DOB: 1936/03/20   Date:11/15/2019       Progress Note  Subjective  Chief Complaint  Chief Complaint  Patient presents with  . Medication Refill  . Hypertension  . Hypothyroidism  . Depression  . Prostate Cancer    HPI  HTN:bp spiked during use of Zytiga medication, bp is at goal now, only on Lisinopril, no chest pain or palpitation   Prostate Cancer: he was diagnosed in 2008 with recurrence  2018diagnosed with metastatic cancer, and resumedtherapy May 2018,he did not respond to [place] he is now [employer] and Lupron every 6 months.   Hypothyroidism: taking levothyroxine and managed at [facility], he denies palpitation, change in bowel movements or dry skin , last TSH was done and it was 12.6 on 08/15/2019, he is currently on 88 mcg daily   Senile purpura: stable, reassurance given again   Calcification abdominal aorta: found on CT abdomen, he has metastatic prostate cancer, he as bleeding and aspirin was stopped because of rectal bleed. Not interested in taking statin therapy. Reviewed LDL done at [facility]   Depression: he states that since he started on medication ( Lexapro ) he has not noticed any difference in his behavior, however his wife has noticed a great improvement in his mood.Patient and wife are pleased with medication   Dyslipidemia: reviewed labs done [facility], done 12 /2020. TC 168  HDL 35, triglycerides down to 157, HDL was 43 and LDL was 94 .   Tobacco: he is not ready to quit smoking yet, he isstillsmokingbetween 3/4 pack daliy He no longer has a daily  cough , but occasionally has a dry cough and sometimes productive, no wheezing and no SOB discussed spirometry and he refuses.   Hearing loss: he saw ENT and had some wax removed and had hearing test and was advised to get a hearing aid, but he is not interested . Unchanged   Patient Active Problem List   Diagnosis Date Noted  . Abnormal coagulation profile  01/28/2019  . Rectal bleeding 12/17/2018  . Hypothyroidism 12/09/2018  . Essential hypertension 10/22/2018  . Bilateral edema of lower extremity 10/22/2018  . Senile purpura ([place]) 01/28/2018  . Hot flashes 05/28/2017  . Fatigue 05/28/2017  . First degree AV block 11/28/2016  . Bradycardia on ECG 11/14/2016  . Situational depression 10/31/2016  . Calcification of abdominal aorta (HCC) 10/31/2016  . Degenerative disc disease, lumbar 10/31/2016  . Prostate cancer metastatic to intrapelvic lymph node ([place]) 10/23/2016  . Malignant neoplasm of prostate ([place]) 08/27/2012  . Enlarged prostate with lower urinary tract symptoms (LUTS) 08/27/2012    Past Surgical History:  Procedure Laterality Date  . APPENDECTOMY  2011  . COLONOSCOPY  2005    Family History  Problem Relation Age of Onset  . Lupus Mother   . Heart disease Mother        CHF  . Heart disease Father     Social History   Tobacco Use  . Smoking status: Current Every Day Smoker    Packs/day: 0.75    Years: 72.00    Pack years: 54.00    Types: Cigarettes    Start date: 01/29/1944  . Smokeless tobacco: Never Used  Substance Use Topics  . Alcohol use: No  . Drug use: No     Current Outpatient Medications:  .  alendronate (FOSAMAX) 70 MG tablet, Take 70 mg by mouth once a week., Disp: , Rfl:  .  benazepril (LOTENSIN) 10 MG tablet, Take 1 tablet (10 mg total) by mouth daily., Disp: 90 tablet, Rfl: 1 .  escitalopram (LEXAPRO) 10 MG tablet, Take 1 tablet (10 mg total) by mouth daily., Disp: 90 tablet, Rfl: 1 .  leuprolide (LUPRON) 22.5 MG injection, Inject 22.5 mg into the muscle every 6 (six) months. , Disp: , Rfl:  .  levothyroxine (SYNTHROID) 100 MCG tablet, Take by mouth., Disp: , Rfl:  .  Multiple Vitamin (MULTI-VITAMINS) TABS, Take by mouth., Disp: , Rfl:  .  tamsulosin (FLOMAX) 0.4 MG CAPS capsule, Take 1 capsule by mouth daily. 30 minutes after largest meal, Disp: , Rfl:  .  vitamin C (ASCORBIC ACID) 250 MG  tablet, Take 250 mg by mouth daily., Disp: , Rfl:  .  XTANDI 40 MG capsule, Take 160 mg by mouth daily. , Disp: , Rfl:  .  aspirin 81 MG tablet, Take 1 tablet by mouth daily., Disp: , Rfl:   Allergies  Allergen Reactions  . Diazepam Other (See Comments)    Hallucinations, altered mental status    I personally reviewed active problem list, medication list, allergies, family history, social history, health maintenance with the patient/caregiver today.   ROS  Constitutional: Negative for fever or weight change.  Respiratory: Negative for cough and shortness of breath.   Cardiovascular: Negative for chest pain or palpitations.  Gastrointestinal: Negative for abdominal pain, no bowel changes.  Musculoskeletal: Negative for gait problem or joint swelling.  Skin: Negative for rash.  Neurological: Negative for dizziness or headache.  No other specific complaints in a complete review of systems (except as listed in HPI above).  Objective  Vitals:   11/15/19 1431  BP: 134/78  Pulse: 78  Resp: 16  Temp: (!) 97.5 F (36.4 C)  TempSrc: Temporal  SpO2: 97%  Weight: 185 lb 11.2 oz (84.2 kg)  Height: 5\' 10"  (1.778 m)    Body mass index is 26.65 kg/m.  Physical Exam  Constitutional: Patient appears well-developed and well-nourished. Overweight.  No distress.  HEENT: head atraumatic, normocephalic, pupils equal and reactive to light, neck supple. No thyromegaly  Cardiovascular: Normal rate, regular rhythm and normal heart sounds.  No murmur heard. No BLE edema. Pulmonary/Chest: Effort normal and breath sounds normal. No respiratory distress. Abdominal: Soft.  There is no tenderness. Psychiatric: Patient has a normal mood and affect. behavior is normal. Judgment and thought content normal.  PHQ2/9: Depression screen [facility] 2/9 11/15/2019 08/16/2019 07/19/2019 01/28/2019 10/22/2018  Decreased Interest 0 0 1 1 0  Down, Depressed, Hopeless 0 0 1 0 0  PHQ - 2 Score 0 0 2 1 0  Altered  sleeping 0 0 0 0 0  Tired, decreased energy 0 1 1 2  0  Change in appetite 0 0 0 0 0  Feeling bad or failure about yourself  0 0 0 0 0  Trouble concentrating 0 0 1 0 0  Moving slowly or fidgety/restless 0 0 0 0 0  Suicidal thoughts 0 0 0 0 0  PHQ-9 Score 0 1 4 3  0  Difficult doing work/chores Not difficult at all Not difficult at all Not difficult at all Not difficult at all Not difficult at all    phq 9 is negative   Fall Risk: Fall Risk  11/15/2019 07/19/2019 01/28/2019 10/22/2018 10/07/2018  Falls in the past year? 0 1 0 0 0  Number falls in past yr: 0 1 0 0 0  Injury with Fall? 0 0 0 0 0  Follow up - Falls evaluation completed - Falls evaluation completed Falls evaluation completed     Functional Status Survey: Is the patient deaf or have difficulty hearing?: No Does the patient have difficulty seeing, even when wearing glasses/contacts?: No Does the patient have difficulty concentrating, remembering, or making decisions?: No Does the patient have difficulty walking or climbing stairs?: No Does the patient have difficulty dressing or bathing?: No Does the patient have difficulty doing errands alone such as visiting a doctor's office or shopping?: No    Assessment & Plan  1. Essential hypertension  At goal, continue medication   2. Calcification of abdominal aorta (HCC)  Refuses statin therapy   3. Senile purpura ([place])   4. Prostate cancer metastatic to intrapelvic lymph node ([place])  Still going to [facility]  5. Age-related osteoporosis without current pathological fracture  Taking Alendronate   6. Claudication ([place])  Stable, and refuses statin therapy   7. Mild depression (HCC)  Continue medication   8. Bilateral edema of lower extremity  Discussed compression stocking hoses   9. Hypothyroidism, adult  - TSH

## 2019-11-16 ENCOUNTER — Other Ambulatory Visit: Payer: Self-pay | Admitting: Family Medicine

## 2019-11-16 DIAGNOSIS — E039 Hypothyroidism, unspecified: Secondary | ICD-10-CM

## 2019-11-16 LAB — TSH: TSH: 13.18 mIU/L — ABNORMAL HIGH (ref 0.40–4.50)

## 2019-11-16 MED ORDER — LEVOTHYROXINE SODIUM 112 MCG PO TABS: 112.0000 ug | ORAL_TABLET | Freq: Every day | ORAL | 1 refills | Status: DC

## 2019-12-28 ENCOUNTER — Other Ambulatory Visit: Payer: Self-pay | Admitting: Family Medicine

## 2019-12-31 LAB — BASIC METABOLIC PANEL
BUN/Creatinine Ratio: 14 (calc) (ref 6–22)
BUN: 16 mg/dL (ref 7–25)
CO2: 22 mmol/L (ref 20–32)
Calcium: 9 mg/dL (ref 8.6–10.3)
Chloride: 109 mmol/L (ref 98–110)
Creat: 1.18 mg/dL — ABNORMAL HIGH (ref 0.70–1.11)
Glucose, Bld: 162 mg/dL — ABNORMAL HIGH (ref 65–99)
Potassium: 4.2 mmol/L (ref 3.5–5.3)
Sodium: 140 mmol/L (ref 135–146)

## 2019-12-31 LAB — TEST AUTHORIZATION

## 2019-12-31 LAB — TSH: TSH: 7.69 mIU/L — ABNORMAL HIGH (ref 0.40–4.50)

## 2020-01-02 ENCOUNTER — Telehealth: Payer: Self-pay

## 2020-01-02 NOTE — Telephone Encounter (Signed)
On his recent lab, Dr. Ancil Boozer inquired how was he taking his medication. His wife called [place] back and let us know it is 112 mcg of this thyroid medication daily.

## 2020-01-03 NOTE — Telephone Encounter (Signed)
Patient wife notified.

## 2020-01-05 ENCOUNTER — Other Ambulatory Visit: Payer: Self-pay

## 2020-01-05 ENCOUNTER — Ambulatory Visit (INDEPENDENT_AMBULATORY_CARE_PROVIDER_SITE_OTHER): Payer: Medicare HMO

## 2020-01-05 DIAGNOSIS — Z0000 Encounter for general adult medical examination without abnormal findings: Secondary | ICD-10-CM

## 2020-01-05 NOTE — Patient Instructions (Signed)
Kevin Donovan , Thank you for taking time to come for your Medicare Wellness Visit. I appreciate your ongoing commitment to your health goals. Please review the following plan we discussed and let me know if I can assist you in the future.   Screening recommendations/referrals: Colonoscopy: no longer required Recommended yearly ophthalmology/optometry visit for glaucoma screening and checkup Recommended yearly dental visit for hygiene and checkup  Vaccinations: Influenza vaccine: done 06/17/19 Pneumococcal vaccine: done 10/31/16 Tdap vaccine: due   Covid-19: done 10/13/19 & 11/11/19  Advanced directives: Advance directive discussed with you today. I have provided a copy for you to complete at home and have notarized. Once this is complete please bring a copy in to our office so we can scan it into your chart.  Conditions/risks identified: If you wish to quit smoking, help is available. For free tobacco cessation program offerings call the [facility] at (754)784-8170 or Live Well Line at 9890032951. You may also visit [email or web address] or email [email or web address] for more information on other programs.   Next appointment: Please follow up in one year for your Medicare Annual Wellness visit.    Preventive Care 23 Years and Older, Male Preventive care refers to lifestyle choices and visits with your health care provider that can promote health and wellness. What does preventive care include?  A yearly physical exam. This is also called an annual well check.  Dental exams once or twice a year.  Routine eye exams. Ask your health care provider how often you should have your eyes checked.  Personal lifestyle choices, including:  Daily care of your teeth and gums.  Regular physical activity.  Eating a healthy diet.  Avoiding tobacco and drug use.  Limiting alcohol use.  Practicing safe sex.  Taking low doses of aspirin every day.  Taking vitamin and  mineral supplements as recommended by your health care provider. What happens during an annual well check? The services and screenings done by your health care provider during your annual well check will depend on your age, overall health, lifestyle risk factors, and family history of disease. Counseling  Your health care provider may ask you questions about your:  Alcohol use.  Tobacco use.  Drug use.  Emotional well-being.  Home and relationship well-being.  Sexual activity.  Eating habits.  History of falls.  Memory and ability to understand (cognition).  Work and work Statistician. Screening  You may have the following tests or measurements:  Height, weight, and BMI.  Blood pressure.  Lipid and cholesterol levels. These may be checked every 5 years, or more frequently if you are over 58 years old.  Skin check.  Lung cancer screening. You may have this screening every year starting at age 60 if you have a 30-pack-year history of smoking and currently smoke or have quit within the past 15 years.  Fecal occult blood test (FOBT) of the stool. You may have this test every year starting at age 36.  Flexible sigmoidoscopy or colonoscopy. You may have a sigmoidoscopy every 5 years or a colonoscopy every 10 years starting at age 19.  Prostate cancer screening. Recommendations will vary depending on your family history and other risks.  Hepatitis C blood test.  Hepatitis B blood test.  Sexually transmitted disease (STD) testing.  Diabetes screening. This is done by checking your blood sugar (glucose) after you have not eaten for a while (fasting). You may have this done every 1-3 years.  Abdominal aortic aneurysm (AAA) screening. You  may need this if you are a current or former smoker.  Osteoporosis. You may be screened starting at age 3 if you are at high risk. Talk with your health care provider about your test results, treatment options, and if necessary, the need  for more tests. Vaccines  Your health care provider may recommend certain vaccines, such as:  Influenza vaccine. This is recommended every year.  Tetanus, diphtheria, and acellular pertussis (Tdap, Td) vaccine. You may need a Td booster every 10 years.  Zoster vaccine. You may need this after age 80.  Pneumococcal 13-valent conjugate (PCV13) vaccine. One dose is recommended after age 14.  Pneumococcal polysaccharide (PPSV23) vaccine. One dose is recommended after age 81. Talk to your health care provider about which screenings and vaccines you need and how often you need them. This information is not intended to replace advice given to you by your health care provider. Make sure you discuss any questions you have with your health care provider. Document Released: 10/05/2015 Document Revised: 05/28/2016 Document Reviewed: 07/10/2015 Elsevier Interactive Patient Education  2017 [place] Prevention in the Home Falls can cause injuries. They can happen to people of all ages. There are many things you can do to make your home safe and to help prevent falls. What can I do on the outside of my home?  Regularly fix the edges of walkways and driveways and fix any cracks.  Remove anything that might make you trip as you walk through a door, such as a raised step or threshold.  Trim any bushes or trees on the path to your home.  Use bright outdoor lighting.  Clear any walking paths of anything that might make someone trip, such as rocks or tools.  Regularly check to see if handrails are loose or broken. Make sure that both sides of any steps have handrails.  Any raised decks and porches should have guardrails on the edges.  Have any leaves, snow, or ice cleared regularly.  Use sand or salt on walking paths during winter.  Clean up any spills in your garage right away. This includes oil or grease spills. What can I do in the bathroom?  Use night lights.  Install grab bars  by the toilet and in the tub and shower. Do not use towel bars as grab bars.  Use non-skid mats or decals in the tub or shower.  If you need to sit down in the shower, use a plastic, non-slip stool.  Keep the floor dry. Clean up any water that spills on the floor as soon as it happens.  Remove soap buildup in the tub or shower regularly.  Attach bath mats securely with double-sided non-slip rug tape.  Do not have throw rugs and other things on the floor that can make you trip. What can I do in the bedroom?  Use night lights.  Make sure that you have a light by your bed that is easy to reach.  Do not use any sheets or blankets that are too big for your bed. They should not hang down onto the floor.  Have a firm chair that has side arms. You can use this for support while you get dressed.  Do not have throw rugs and other things on the floor that can make you trip. What can I do in the kitchen?  Clean up any spills right away.  Avoid walking on wet floors.  Keep items that you use a lot in easy-to-reach places.  If you need to reach something above you, use a strong step stool that has a grab bar.  Keep electrical cords out of the way.  Do not use floor polish or wax that makes floors slippery. If you must use wax, use non-skid floor wax.  Do not have throw rugs and other things on the floor that can make you trip. What can I do with my stairs?  Do not leave any items on the stairs.  Make sure that there are handrails on both sides of the stairs and use them. Fix handrails that are broken or loose. Make sure that handrails are as long as the stairways.  Check any carpeting to make sure that it is firmly attached to the stairs. Fix any carpet that is loose or worn.  Avoid having throw rugs at the top or bottom of the stairs. If you do have throw rugs, attach them to the floor with carpet tape.  Make sure that you have a light switch at the top of the stairs and the  bottom of the stairs. If you do not have them, ask someone to add them for you. What else can I do to help prevent falls?  Wear shoes that:  Do not have high heels.  Have rubber bottoms.  Are comfortable and fit you well.  Are closed at the toe. Do not wear sandals.  If you use a stepladder:  Make sure that it is fully opened. Do not climb a closed stepladder.  Make sure that both sides of the stepladder are locked into place.  Ask someone to hold it for you, if possible.  Clearly mark and make sure that you can see:  Any grab bars or handrails.  First and last steps.  Where the edge of each step is.  Use tools that help you move around (mobility aids) if they are needed. These include:  Canes.  Walkers.  Scooters.  Crutches.  Turn on the lights when you go into a dark area. Replace any light bulbs as soon as they burn out.  Set up your furniture so you have a clear path. Avoid moving your furniture around.  If any of your floors are uneven, fix them.  If there are any pets around you, be aware of where they are.  Review your medicines with your doctor. Some medicines can make you feel dizzy. This can increase your chance of falling. Ask your doctor what other things that you can do to help prevent falls. This information is not intended to replace advice given to you by your health care provider. Make sure you discuss any questions you have with your health care provider. Document Released: 07/05/2009 Document Revised: 02/14/2016 Document Reviewed: 10/13/2014 Elsevier Interactive Patient Education  2017 [employer].

## 2020-01-05 NOTE — Progress Notes (Signed)
Subjective:   Kevin Donovan is a 84 y.o. male who presents for an Initial Medicare Annual Wellness Visit.  Virtual Visit via Telephone Note  I connected with Kevin Donovan on 01/05/20 at 11:20 AM EDT by telephone and verified that I am speaking with the correct person using two identifiers.  Medicare Annual Wellness visit completed telephonically due to Covid-19 pandemic.   Location: Patient: home Provider: office   I discussed the limitations, risks, security and privacy concerns of performing an evaluation and management service by telephone and the availability of in person appointments. The patient expressed understanding and agreed to proceed.  Some vital signs may be absent or patient reported.   Clemetine Marker, LPN    Review of Systems        Objective:    There were no vitals filed for this visit. There is no height or weight on file to calculate BMI.  Advanced Directives 01/05/2020 10/31/2016  Does Patient Have a Medical Advance Directive? No No  Would patient like information on creating a medical advance directive? Yes (MAU/Ambulatory/Procedural Areas - Information given) -    Current Medications (verified) Outpatient Encounter Medications as of 01/05/2020  Medication Sig  . alendronate (FOSAMAX) 70 MG tablet Take 70 mg by mouth once a week.  . benazepril (LOTENSIN) 10 MG tablet Take 1 tablet (10 mg total) by mouth daily.  Marland Kitchen escitalopram (LEXAPRO) 10 MG tablet Take 1 tablet (10 mg total) by mouth daily.  Marland Kitchen leuprolide (LUPRON) 22.5 MG injection Inject 22.5 mg into the muscle every 6 (six) months.   . levothyroxine (SYNTHROID) 112 MCG tablet Take 1 tablet (112 mcg total) by mouth daily before breakfast.  . Multiple Vitamin (MULTI-VITAMINS) TABS Take by mouth.  . tamsulosin (FLOMAX) 0.4 MG CAPS capsule Take 1 capsule by mouth daily. 30 minutes after largest meal  . vitamin C (ASCORBIC ACID) 250 MG tablet Take 250 mg by mouth daily.  Gillermina Phy 40 MG capsule  Take 160 mg by mouth daily.   . [DISCONTINUED] aspirin 81 MG tablet Take 1 tablet by mouth daily.   No facility-administered encounter medications on file as of 01/05/2020.    Allergies (verified) Diazepam   History: Past Medical History:  Diagnosis Date  . Prostate cancer ([place])   . Thyroid disease    Past Surgical History:  Procedure Laterality Date  . APPENDECTOMY  2011  . COLONOSCOPY  2005   Family History  Problem Relation Age of Onset  . Lupus Mother   . Heart disease Mother        CHF  . Heart disease Father    Social History   Socioeconomic History  . Marital status: Married    Spouse name: [name]  . Number of children: 1  . Years of education: Not on file  . Highest education level: Some college, no degree  Occupational History  . Not on file  Tobacco Use  . Smoking status: Current Every Day Smoker    Packs/day: 0.75    Years: 72.00    Pack years: 54.00    Types: Cigarettes    Start date: 01/29/1944  . Smokeless tobacco: Never Used  Substance and Sexual Activity  . Alcohol use: No  . Drug use: No  . Sexual activity: Yes    Partners: Female  Other Topics Concern  . Not on file  Social History Narrative  . Not on file   Social Determinants of Health   Financial Resource Strain: Low Risk   .  Difficulty of Paying Living Expenses: Not hard at all  Food Insecurity: No Food Insecurity  . Worried About Charity fundraiser in the Last Year: Never true  . Ran Out of Food in the Last Year: Never true  Transportation Needs: No Transportation Needs  . Lack of Transportation (Medical): No  . Lack of Transportation (Non-Medical): No  Physical Activity: Sufficiently Active  . Days of Exercise per Week: 7 days  . Minutes of Exercise per Session: 90 min  Stress: No Stress Concern Present  . Feeling of Stress : Not at all  Social Connections: Not Isolated  . Frequency of Communication with Friends and Family: More than three times a week  . Frequency of  Social Gatherings with Friends and Family: Three times a week  . Attends Religious Services: More than 4 times per year  . Active Member of Clubs or Organizations: Yes  . Attends Archivist Meetings: Never  . Marital Status: Married   Tobacco Counseling Ready to quit: Not Answered Counseling given: Not Answered   Clinical Intake:  Pre-visit preparation completed: Yes  Pain : No/denies pain     Nutritional Risks: None Diabetes: Yes CBG done?: No Did pt. bring in CBG monitor from home?: No  How often do you need to have someone help you when you read instructions, pamphlets, or other written materials from your doctor or pharmacy?: 1 - Never  Interpreter Needed?: No  Information entered by :: Clemetine Marker LPN  Activities of Daily Living In your present state of health, do you have any difficulty performing the following activities: 01/05/2020 11/15/2019  Hearing? Y N  Comment declines hearing aids -  Vision? N N  Difficulty concentrating or making decisions? N N  Walking or climbing stairs? N N  Dressing or bathing? N N  Doing errands, shopping? N N  Preparing Food and eating ? N -  Using the Toilet? N -  In the past six months, have you accidently leaked urine? Y -  Do you have problems with loss of bowel control? N -  Managing your Medications? N -  Managing your Finances? N -  Housekeeping or managing your Housekeeping? N -  Some recent data might be hidden     Immunizations and Health Maintenance Immunization History  Administered Date(s) Administered  . Fluad Quad(high Dose 65+) 06/17/2019  . Influenza, High Dose Seasonal PF 06/30/2018  . Influenza-Unspecified 06/03/2016, 06/24/2017  . Moderna SARS-COVID-2 Vaccination 10/13/2019, 11/11/2019  . Pneumococcal Conjugate-13 10/31/2016  . Pneumococcal Polysaccharide-23 02/25/2013  . Zoster 03/28/2013   There are no preventive care reminders to display for this patient.  Patient Care Team: Steele Sizer, MD as PCP - General (Family Medicine)  Indicate any recent Medical Services you may have received from other than [facility] providers in the past year (date may be approximate).    Assessment:   This is a routine wellness examination for Kevin Donovan.  Hearing/Vision screen  Hearing Screening   125Hz  250Hz  500Hz  1000Hz  2000Hz  3000Hz  4000Hz  6000Hz  8000Hz   Right ear:           Left ear:           Comments: Pt states mild hearing difficulty  Vision Screening Comments: Annual vision screenings done at [facility]  Dietary issues and exercise activities discussed: Current Exercise Habits: Home exercise routine, Type of exercise: Other - see comments(yard work, gardening.), Exercise limited by: None identified  Goals   None    Depression Screen  PHQ 2/9 Scores 01/05/2020 11/15/2019 08/16/2019 07/19/2019  PHQ - 2 Score 0 0 0 2  PHQ- 9 Score - 0 1 4    Fall Risk Fall Risk  01/05/2020 11/15/2019 07/19/2019 01/28/2019 10/22/2018  Falls in the past year? 0 0 1 0 0  Number falls in past yr: 0 0 1 0 0  Injury with Fall? 0 0 0 0 0  Risk for fall due to : No Fall Risks - - - -  Follow up Falls prevention discussed - Falls evaluation completed - Falls evaluation completed   FALL RISK PREVENTION PERTAINING TO THE HOME:  Any stairs in or around the home? Yes  If so, do they handrails? Yes   Home free of loose throw rugs in walkways, pet beds, electrical cords, etc? Yes  Adequate lighting in your home to reduce risk of falls? Yes   ASSISTIVE DEVICES UTILIZED TO PREVENT FALLS:  Life alert? No  Use of a cane, walker or w/c? No  Grab bars in the bathroom? No  Shower chair or bench in shower? No  Elevated toilet seat or a handicapped toilet? No   DME ORDERS:  DME order needed?  No   TIMED UP AND GO:  Was the test performed? No . Telephonic visit.   Education: Fall risk prevention has been discussed.  Intervention(s) required? No   Cognitive Function: 6CIT deferred for 2021 AWV; pt  has no memory issues        Screening Tests Health Maintenance  Topic Date Due  . INFLUENZA VACCINE  04/22/2020  . TETANUS/TDAP  03/29/2023  . PNA vac Low Risk Adult  Completed    Qualifies for Shingles Vaccine? Yes  Zostavax completed 2014. Due for Shingrix. Education has been provided regarding the importance of this vaccine. Pt has been advised to call insurance company to determine out of pocket expense. Advised may also receive vaccine at local pharmacy or Health Dept. Verbalized acceptance and understanding.  Tdap: Up to date per patient has had a TD in the last 10 years.   Flu Vaccine: Up to date  Pneumococcal Vaccine: Up to date   Cancer Screenings:  Colorectal Screening: Completed 12/20/18. No longer required.   Lung Cancer Screening: (Low Dose CT Chest recommended if Age 48-80 years, 30 pack-year currently smoking OR have quit w/in 15years.) does qualify. Chest CT [facility] at [facility]/2019.  Additional Screening:  Hepatitis C Screening: no longer required.   Vision Screening: Recommended annual ophthalmology exams for early detection of glaucoma and other disorders of the eye. Is the patient up to date with their annual eye exam?  Yes  Who is the provider or what is the name of the office in which the pt attends annual eye exams? [place] Screening: Recommended annual dental exams for proper oral hygiene  Community Resource Referral:  CRR required this visit?  No       Plan:    I have personally reviewed and addressed the Medicare Annual Wellness questionnaire and have noted the following in the patient's chart:  A. Medical and social history B. Use of alcohol, tobacco or illicit drugs  C. Current medications and supplements D. Functional ability and status E.  Nutritional status F.  Physical activity G. Advance directives H. List of other physicians I.  Hospitalizations, surgeries, and ER visits in previous 12 months J.   [place] such as hearing and vision if needed, cognitive and depression L. Referrals and appointments  In addition, I have reviewed and discussed with patient certain preventive protocols, quality metrics, and best practice recommendations. A written personalized care plan for preventive services as well as general preventive health recommendations were provided to patient.   Signed,  Clemetine Marker, LPN Nurse Health Advisor   Nurse Notes: pt doing well and appreciative of visit today.

## 2020-01-13 ENCOUNTER — Other Ambulatory Visit: Payer: Self-pay | Admitting: Family Medicine

## 2020-01-13 DIAGNOSIS — E039 Hypothyroidism, unspecified: Secondary | ICD-10-CM

## 2020-01-13 NOTE — Telephone Encounter (Signed)
Requested medication (s) are due for refill today -yes  Requested medication (s) are on the active medication list -yes  Future visit scheduled -yes  Last refill: 12/14/19  Notes to clinic: Request change in # and Sig to reflex current dosing  Requested Prescriptions  Pending Prescriptions Disp Refills   levothyroxine (SYNTHROID) 112 MCG tablet [Pharmacy Med Name: LEVOTHYROXINE SODIUM 112 MCG TAB] 30 tablet 1    Sig: TAKE 1 TABLET BY MOUTH ONCE DAILY BEFOREBREAKFAST      Endocrinology:  Hypothyroid Agents Failed - 01/13/2020 11:41 AM      Failed - TSH needs to be rechecked within 3 months after an abnormal result. Refill until TSH is due.      Failed - TSH in normal range and within 360 days    TSH  Date Value Ref Range Status  12/28/2019 7.69 (H) 0.40 - 4.50 mIU/L Final          Passed - Valid encounter within last 12 months    Recent Outpatient Visits           1 month ago Essential hypertension   [facility] Steele Sizer, MD   5 months ago Essential hypertension   [facility] [place], Drue Stager, MD   5 months ago Bilateral hearing loss, unspecified hearing loss type   [facility] [place], [place]   11 months ago Essential hypertension   [facility] Steele Sizer, MD   1 year ago Essential hypertension   [place], [place], [place]       Future Appointments             In 1 month Steele Sizer, MD [facility], [facility]                Requested Prescriptions  Pending Prescriptions Disp Refills   levothyroxine (SYNTHROID) 112 MCG tablet [Pharmacy Med Name: LEVOTHYROXINE SODIUM 112 MCG TAB] 30 tablet 1    Sig: TAKE 1 TABLET BY MOUTH ONCE DAILY BEFOREBREAKFAST      Endocrinology:  Hypothyroid Agents Failed - 01/13/2020 11:41 AM      Failed - TSH needs to be rechecked within 3 months after an abnormal result. Refill until TSH is due.      Failed - TSH in normal range and within 360 days    TSH  Date Value Ref Range Status  12/28/2019 7.69 (H) 0.40 - 4.50 mIU/L Final          Passed - Valid encounter within last 12 months    Recent Outpatient Visits           1 month ago Essential hypertension   [facility] Steele Sizer, MD   5 months ago Essential hypertension   [facility] [place], Drue Stager, MD   5 months ago Bilateral hearing loss, unspecified hearing loss type   [facility] [place], [place]   11 months ago Essential hypertension   [facility] Steele Sizer, MD   1 year ago Essential hypertension   [place], [place]       Future Appointments             In 1 month Steele Sizer, MD [facility], [facility]

## 2020-02-10 ENCOUNTER — Other Ambulatory Visit: Payer: Self-pay | Admitting: Family Medicine

## 2020-02-10 DIAGNOSIS — E039 Hypothyroidism, unspecified: Secondary | ICD-10-CM

## 2020-02-13 ENCOUNTER — Ambulatory Visit: Payer: Medicare HMO | Admitting: Family Medicine

## 2020-02-15 ENCOUNTER — Encounter: Payer: Self-pay | Admitting: Family Medicine

## 2020-02-15 ENCOUNTER — Other Ambulatory Visit: Payer: Self-pay

## 2020-02-15 ENCOUNTER — Ambulatory Visit (INDEPENDENT_AMBULATORY_CARE_PROVIDER_SITE_OTHER): Payer: Medicare HMO | Admitting: Family Medicine

## 2020-02-15 VITALS — BP 130/78

## 2020-02-15 DIAGNOSIS — I10 Essential (primary) hypertension: Secondary | ICD-10-CM

## 2020-02-15 DIAGNOSIS — E039 Hypothyroidism, unspecified: Secondary | ICD-10-CM | POA: Diagnosis not present

## 2020-02-15 NOTE — Progress Notes (Signed)
Name: Kevin Donovan   MRN: [id number]    DOB: Oct 26, 1935   Date:02/15/2020       Progress Note  Subjective  Chief Complaint  Chief Complaint  Patient presents with  . Hypothyroidism    6 week follow up  . Hypertension    I connected with  Hewitt Blade Mcandrew on 02/15/20 at  1:40 PM EDT by telephone and verified that I am speaking with the correct person using two identifiers.   I discussed the limitations, risks, security and privacy concerns of performing an evaluation and management service by telephone and the availability of in person appointments. Staff also discussed with the patient that there may be a patient responsible charge related to this service. Patient Location: at home  Provider Location: [facility]   HPI  Hypothyroidism: he is now taking 125 mcg daily since April when TSH was still elevated, he states he is not noticed any changes. No dysphagia, diarrhea, palpitation, dry skin. No changes   HTN: he is taking medications daily, no chest pain or palpitation.   Patient Active Problem List   Diagnosis Date Noted  . Abnormal coagulation profile 01/28/2019  . Rectal bleeding 12/17/2018  . Hypothyroidism 12/09/2018  . Essential hypertension 10/22/2018  . Bilateral edema of lower extremity 10/22/2018  . Senile purpura ([place]) 01/28/2018  . Hot flashes 05/28/2017  . Fatigue 05/28/2017  . First degree AV block 11/28/2016  . Bradycardia on ECG 11/14/2016  . Situational depression 10/31/2016  . Calcification of abdominal aorta (HCC) 10/31/2016  . Degenerative disc disease, lumbar 10/31/2016  . Prostate cancer metastatic to intrapelvic lymph node ([place]) 10/23/2016  . Malignant neoplasm of prostate ([place]) 08/27/2012  . Enlarged prostate with lower urinary tract symptoms (LUTS) 08/27/2012    Social History   Tobacco Use  . Smoking status: Current Every Day Smoker    Packs/day: 0.75    Years: 72.00    Pack years: 54.00    Types: Cigarettes    Start date: 01/29/1944  .  Smokeless tobacco: Never Used  Substance Use Topics  . Alcohol use: No     Current Outpatient Medications:  .  alendronate (FOSAMAX) 70 MG tablet, Take 70 mg by mouth once a week., Disp: , Rfl:  .  benazepril (LOTENSIN) 10 MG tablet, Take 1 tablet (10 mg total) by mouth daily., Disp: 90 tablet, Rfl: 1 .  escitalopram (LEXAPRO) 10 MG tablet, Take 1 tablet (10 mg total) by mouth daily., Disp: 90 tablet, Rfl: 1 .  leuprolide (LUPRON) 22.5 MG injection, Inject 22.5 mg into the muscle every 6 (six) months. , Disp: , Rfl:  .  levothyroxine (SYNTHROID) 125 MCG tablet, Take 1 tablet (125 mcg total) by mouth daily before breakfast., Disp: 30 tablet, Rfl: 1 .  Multiple Vitamin (MULTI-VITAMINS) TABS, Take by mouth., Disp: , Rfl:  .  tamsulosin (FLOMAX) 0.4 MG CAPS capsule, Take 1 capsule by mouth daily. 30 minutes after largest meal, Disp: , Rfl:  .  vitamin C (ASCORBIC ACID) 250 MG tablet, Take 250 mg by mouth daily., Disp: , Rfl:  .  XTANDI 40 MG capsule, Take 160 mg by mouth daily. , Disp: , Rfl:   Allergies  Allergen Reactions  . Diazepam Other (See Comments)    Hallucinations, altered mental status    I personally reviewed active problem list, medication list, allergies, family history, social history, health maintenance with the patient/caregiver today.  ROS  Ten systems reviewed and is negative except as mentioned in HPI  Objective  Virtual encounter, vitals obtained at home  Today's Vitals   02/15/20 0945  BP: 130/78   There is no height or weight on file to calculate BMI.  Nursing Note and Vital Signs reviewed.  Physical Exam  Awake, alert and oriented   Assessment & Plan  1. Hypothyroidism, adult  - TSH  2. Essential hypertension  bp is at goal   -Red flags and when to present for emergency care or RTC including fever >101.50F, chest pain, shortness of breath, new/worsening/un-resolving symptoms, 15 reviewed with patient at time of visit. Follow up and care  instructions discussed and provided in AVS. - I discussed the assessment and treatment plan with the patient. The patient was provided an opportunity to ask questions and all were answered. The patient agreed with the plan and demonstrated an understanding of the instructions.  - The patient was advised to call back or seek an in-person evaluation if the symptoms worsen or if the condition fails to improve as anticipated.  I provided 15 minutes of non-face-to-face time during this encounter.  Loistine Chance, MD

## 2020-02-17 LAB — TSH: TSH: 6.97 mIU/L — ABNORMAL HIGH (ref 0.40–4.50)

## 2020-02-20 ENCOUNTER — Other Ambulatory Visit: Payer: Self-pay | Admitting: Family Medicine

## 2020-02-20 DIAGNOSIS — E039 Hypothyroidism, unspecified: Secondary | ICD-10-CM

## 2020-02-20 MED ORDER — LEVOTHYROXINE SODIUM 137 MCG PO TABS: 137.0000 ug | ORAL_TABLET | Freq: Every day | ORAL | 1 refills | Status: DC

## 2020-02-27 ENCOUNTER — Other Ambulatory Visit: Payer: Self-pay | Admitting: Family Medicine

## 2020-02-27 DIAGNOSIS — I10 Essential (primary) hypertension: Secondary | ICD-10-CM

## 2020-03-15 ENCOUNTER — Ambulatory Visit: Admit: 2020-03-15 | Discharge: 2020-03-15 | Payer: MEDICARE | Attending: Medical Oncology | Primary: Medical Oncology

## 2020-03-15 ENCOUNTER — Institutional Professional Consult (permissible substitution): Admit: 2020-03-15 | Discharge: 2020-03-15 | Payer: MEDICARE

## 2020-03-15 ENCOUNTER — Other Ambulatory Visit: Admit: 2020-03-15 | Discharge: 2020-03-15 | Payer: MEDICARE

## 2020-03-15 DIAGNOSIS — C775 Secondary and unspecified malignant neoplasm of intrapelvic lymph nodes: Principal | ICD-10-CM

## 2020-03-15 DIAGNOSIS — C61 Malignant neoplasm of prostate: Principal | ICD-10-CM

## 2020-03-15 DIAGNOSIS — E039 Hypothyroidism, unspecified: Principal | ICD-10-CM

## 2020-03-21 ENCOUNTER — Other Ambulatory Visit: Payer: Self-pay | Admitting: Family Medicine

## 2020-03-21 DIAGNOSIS — F32A Depression, unspecified: Secondary | ICD-10-CM

## 2020-04-05 ENCOUNTER — Telehealth: Payer: Self-pay | Admitting: Emergency Medicine

## 2020-04-05 ENCOUNTER — Encounter: Payer: Self-pay | Admitting: Family Medicine

## 2020-04-05 ENCOUNTER — Telehealth (INDEPENDENT_AMBULATORY_CARE_PROVIDER_SITE_OTHER): Payer: Medicare HMO | Admitting: Family Medicine

## 2020-04-05 ENCOUNTER — Other Ambulatory Visit: Payer: Self-pay

## 2020-04-05 VITALS — BP 117/78

## 2020-04-05 DIAGNOSIS — R42 Dizziness and giddiness: Secondary | ICD-10-CM

## 2020-04-05 NOTE — Progress Notes (Signed)
Name: Kevin Donovan   MRN: [id number]    DOB: [birth year]   Date:04/05/2020       Progress Note  Subjective:    Chief Complaint  Chief Complaint  Patient presents with  . Follow-up    low blood pressure, dizzy 117/68, 108/59    I connected with  Kevin Donovan on 04/05/20 at  2:00 PM EDT by telephone and verified that I am speaking with the correct person using two identifiers.   I discussed the limitations, risks, security and privacy concerns of performing an evaluation and management service by telephone and the availability of in person appointments. Staff also discussed with the patient that there may be a patient responsible charge related to this service. Patient Location: home Provider Location: [facility] Additional Individuals present: none  HPI  He presents via virtual visit for lower BP and dizziness. BP has been 117/68 to 108/59 Pt is on benazepril 10 mg daily He checks his BP usually once a day - BP readings 150/70 - 160/70 He has been outside in the heat all day and working in the garden  Recent labs reviewed, normal renal function on 03/15/2020, he asks about his thyroid labs - TSH last was 03/15/2020 normal, prior to that.  No SOB, LE edema, palpitations, HA, N, V, visual disturbances, CP.  The heat makes him not feel like eating most of what he usually eats.  He reports drinking ample amount of fluids and urinating frequently.  No confusion, muscle cramps, passing out episodes, exertional sx, he denies any drop in his weight.  His only complaint it that if he stands up quickly he feels lightheaded for a few seconds.      Patient Active Problem List   Diagnosis Date Noted  . Abnormal coagulation profile 01/28/2019  . Rectal bleeding 12/17/2018  . Hypothyroidism 12/09/2018  . Essential hypertension 10/22/2018  . Bilateral edema of lower extremity 10/22/2018  . Senile purpura ([place]) 01/28/2018  . Hot flashes 05/28/2017  . Fatigue 05/28/2017  . First  degree AV block 11/28/2016  . Bradycardia on ECG 11/14/2016  . Situational depression 10/31/2016  . Calcification of abdominal aorta (HCC) 10/31/2016  . Degenerative disc disease, lumbar 10/31/2016  . Prostate cancer metastatic to intrapelvic lymph node ([place]) 10/23/2016  . Malignant neoplasm of prostate ([place]) 08/27/2012  . Enlarged prostate with lower urinary tract symptoms (LUTS) 08/27/2012    Social History   Tobacco Use  . Smoking status: Current Every Day Smoker    Packs/day: 0.75    Years: 72.00    Pack years: 54.00    Types: Cigarettes    Start date: 01/29/1944  . Smokeless tobacco: Never Used  Substance Use Topics  . Alcohol use: No     Current Outpatient Medications:  .  alendronate (FOSAMAX) 70 MG tablet, Take 70 mg by mouth once a week., Disp: , Rfl:  .  benazepril (LOTENSIN) 10 MG tablet, TAKE 1 TABLET BY MOUTH ONCE DAILY, Disp: 90 tablet, Rfl: 1 .  escitalopram (LEXAPRO) 10 MG tablet, TAKE 1 TABLET BY MOUTH ONCE DAILY, Disp: 90 tablet, Rfl: 1 .  leuprolide (LUPRON) 22.5 MG injection, Inject 22.5 mg into the muscle every 6 (six) months. , Disp: , Rfl:  .  levothyroxine (SYNTHROID) 137 MCG tablet, Take 1 tablet (137 mcg total) by mouth daily before breakfast., Disp: 30 tablet, Rfl: 1 .  Multiple Vitamin (MULTI-VITAMINS) TABS, Take by mouth., Disp: , Rfl:  .  tamsulosin (FLOMAX) 0.4 MG CAPS  capsule, Take 1 capsule by mouth daily. 30 minutes after largest meal, Disp: , Rfl:  .  vitamin C (ASCORBIC ACID) 250 MG tablet, Take 250 mg by mouth daily., Disp: , Rfl:  .  XTANDI 40 MG capsule, Take 160 mg by mouth daily. , Disp: , Rfl:   Allergies  Allergen Reactions  . Diazepam Other (See Comments)    Hallucinations, altered mental status    Chart Review: I personally reviewed active problem list, medication list, allergies, family history, social history, health maintenance, notes from last encounter, lab results, imaging with the patient/caregiver today.   Review of  Systems  Constitutional: Positive for appetite change. Negative for activity change, chills, diaphoresis, fatigue, fever and unexpected weight change.  HENT: Negative.   Eyes: Negative.   Respiratory: Negative.  Negative for shortness of breath.   Cardiovascular: Negative.  Negative for chest pain, palpitations and leg swelling.  Gastrointestinal: Negative.  Negative for abdominal pain, constipation, diarrhea, nausea and vomiting.  Endocrine: Negative.   Genitourinary: Negative.  Negative for decreased urine volume.  Musculoskeletal: Negative.   Skin: Negative.  Negative for color change and pallor.  Allergic/Immunologic: Negative.   Neurological: Negative.  Negative for tremors, syncope, speech difficulty, weakness and numbness.  Hematological: Negative.   Psychiatric/Behavioral: Negative.  Negative for agitation, confusion and decreased concentration.  All other systems reviewed and are negative.  10 Systems reviewed and are negative for acute change except as noted in the HPI.   Objective:    Virtual encounter, vitals limited, only able to obtain the following Today's Vitals   04/05/20 1303  BP: 117/78   There is no height or weight on file to calculate BMI. Nursing Note and Vital Signs reviewed.  Physical Exam Pt alert, clear phonation, answering questions appropriately PE limited by telephone encounter  No results found for this or any previous visit (from the past 72 hour(s)).  Assessment and Plan:     ICD-10-CM   1. Lightheadedness  R42   Patient is a 84 year old male per medical history of metastatic prostate cancer (stable metastases to multiple lymph nodes-being treated by [facility] medical oncology) also history of hypertension, atherosclerotic cardiovascular disease, he is current smoker, a history of bradycardia and hypothyroid, he presents for lightheaded episodes and he notes that his home blood pressure monitoring recently has been lower than his normal.  He states  that his home blood pressure readings are usually SBP 150-160, and recently has been much lower 106 - 120's.  He is only symptomatic when going from seated or squatting position to standing very rapidly he denies any syncopal episodes or any other concerning associated symptoms (see HPI).  He notes being outdoors in the hot weather and working in his garden "all day" in the heat also causing a general lack of appetite for foods that he normally eats.  He maintains however that he is drinking ample fluids and urinating like normal.  I suspect he has some dehydration causing some postural lightheadedness encouraged him to cut his blood pressure medication in half while staying out of the sun for long periods of time, pushing clear fluids and ensuring that he is eating normal meals, suggest that he substitute some cooler or cold sources of food such as cold cut sandwiches, fruit, smoothies etc.  Encouraged him to monitor his blood pressure and stated very clearly multiple times that if his blood pressure remains low or if he continues to have lightheaded episodes he will need to come into clinic for  in person examination to be checked.  I warned of the likelihood for him to become dehydrated due to his age.    If BP did not improve with plan noted above I did encourage him to hold his BP med completely and get seen right away for eval.   He politely declined making a follow-up appointment today.  He also noted that he has labs due at the end of the month but I explained that that would not suffice to assess him if his symptoms or not improving and he will need more immediate evaluation if his symptoms worsen.   -Red flags and when to present for emergency care or RTC including but not limited to new/worsening/un-resolving symptoms, reviewed with patient at time of visit. Follow up and care instructions discussed and provided in AVS. - I discussed the assessment and treatment plan with the patient. The  patient was provided an opportunity to ask questions and all were answered. The patient agreed with the plan and demonstrated an understanding of the instructions.  - The patient was advised to call back or seek an in-person evaluation if the symptoms worsen or if the condition fails to improve as anticipated.    I provided 25+ minutes of non-face-to-face time during this encounter.  Delsa Grana, PA-C 04/05/20 1:41 PM

## 2020-04-05 NOTE — Patient Instructions (Signed)
Avoid being out in the sun for long periods of time Push ample clear fluids and make sure you are eating normal meals or several snacks throughout the day You may get take a half dose of your blood pressure medication and monitor for the next couple days.     If blood pressure or any symptoms do not improve or if they worsen, you need to be seen in person right away.    If BP continues to be lower than your normal, you may temporarily hold the BP pill all together, but you need to be seen in person in the next week to be rechecked.  Labs a week or two from now is not going to be enough to help make sure you are well.  Go to the ER or call 911 with any passing out episodes, severe muscle spasms cramping, sweating, nausea, vomiting, confusion, exhaustion, shortness of breath or chest pain.  Dehydration, Elderly  Dehydration is condition in which there is not enough water or other fluids in the body. This happens when a person loses more fluids than he or she takes in. Important body parts cannot work right without the right amount of fluids. Any loss of fluids from the body can cause dehydration. People 82 years of age or older have a higher risk of dehydration than younger adults. This is because in older age, the body:  Is less able to keep the right amount of water.  Does not respond to temperature changes as well.  Does not get a sense of thirst as easily or quickly. Dehydration can be mild, worse, or very bad. It should be treated right away to keep it from getting very bad. What are the causes? This condition may be caused by:  Conditions that cause loss of water or other fluids, such as: ? Watery poop (diarrhea). ? Vomiting. ? Sweating a lot. ? Peeing (urinating) a lot.  Not drinking enough fluids, especially when you: ? Are ill. ? Are doing things that take a lot of energy to do.  Other illnesses and conditions, such as fever or infection.  Certain medicines, such as  medicines that take extra fluid out of the body (diuretics).  Lack of safe drinking water.  Not being able to get enough water and food. What increases the risk? The following factors may make you more likely to develop this condition:  Having a long-term (chronic) illness that has not been treated the right way, such as: ? An illness that may cause you to pee more, such as diabetes. ? Kidney, heart, or lung disease. ? A condition such as dementia. This affects:  The brain and nervous system.  Thinking.  Feelings.  Being 82 years of age or older.  Having a disability.  Living in a place that is high above the ground or sea (high in altitude). The thinner, drier air causes more fluid loss. What are the signs or symptoms? Symptoms of dehydration depend on how bad it is. Mild or worse dehydration  Thirst.  Dry lips or dry mouth.  Feeling dizzy or light-headed, especially when you stand up from sitting.  Muscle cramps.  Your body making: ? Dark pee (urine). Pee may be the color of tea. ? Less pee than normal. ? Less tears than normal.  Headache. Very bad dehydration  Changes in skin. Skin may: ? Be cold to the touch (clammy). ? Be blotchy or pale. ? Not go back to normal right after you lightly pinch  it and let it go.  Little or no tears, pee, or sweat.  Changes in vital signs, such as: ? Fast breathing. ? Low blood pressure. ? Weak pulse. ? Pulse that is more than 100 beats a minute when you are sitting still.  Other changes, such as: ? Feeling very thirsty. ? Eyes that look hollow (sunken). ? Cold hands and feet. ? Being mixed up (confused). ? Being very tired (lethargic) or having trouble waking from sleep. ? Short-term weight loss. ? Loss of consciousness. How is this treated? Treatment for this condition depends on how bad it is. Treatment should start right away. Do not wait until your condition gets very bad. Very bad dehydration is an emergency.  You will need to go to a hospital.  Mild or worse dehydration can be treated at home. You may be asked to: ? Drink more fluids. ? Drink an oral rehydration solution (ORS). This drink helps get the right amounts of fluids and salts and minerals in the blood (electrolytes).  Very bad dehydration can be treated: ? With fluids through an IV tube. ? By getting normal levels of salts and minerals in your blood. This is often done by giving salts and minerals through a tube. The tube is passed through your nose and into your stomach. ? By treating the root cause. Follow these instructions at home: Oral rehydration solution If told by your doctor, drink an ORS:  Make an ORS. Use instructions on the package.  Start by drinking small amounts, about  cup (120 mL) every 5-10 minutes.  Slowly drink more until you have had the amount that your doctor said to have. Eating and drinking         Drink enough clear fluid to keep your pee pale yellow. If you were told to drink an ORS, finish the ORS first. Then, start slowly drinking other clear fluids. Drink fluids such as: ? Water. Do not drink only water. Doing that can make the salt (sodium) level in your body get too low. ? Water from ice chips you suck on. ? Fruit juice that you have added water to (diluted). ? Low-calorie sports drinks.  Eat foods that have the right amounts of salts and minerals, such as: ? Bananas. ? Oranges. ? Potatoes. ? Tomatoes. ? Spinach.  Do not drink alcohol.  Avoid: ? Drinks that have a lot of sugar. These include:  High-calorie sports drinks.  Fruit juice that you did not add water to.  Soda.  Caffeine. ? Foods that are greasy or have a lot of fat or sugar. General instructions  Take over-the-counter and prescription medicines only as told by your doctor.  Do not take salt tablets. Doing that can make the salt level in your body get too high.  Return to your normal activities as told by your  doctor. Ask your doctor what activities are safe for you.  Keep all follow-up visits as told by your doctor. This is important. Contact a doctor if:  You have pain in your belly (abdomen) and the pain: ? Gets worse. ? Stays in one place.  You have a rash.  You have a stiff neck.  You get angry or annoyed (irritable) more easily than normal.  You are more tired or have a harder time waking than normal.  You feel: ? Weak or dizzy. ? Very thirsty. Get help right away if you have:  Any symptoms of very bad dehydration.  A fever.  A very bad  headache.  Symptoms of vomiting, such as: ? Your vomiting gets worse or does not go away. ? Your vomit has blood or green stuff in it. ? You cannot eat or drink without vomiting.  Problems with peeing or pooping (having a bowel movement), such as: ? Watery poop that gets worse or does not go away. ? Blood in your poop (stool). This may cause poop to look black and tarry. ? Not peeing in 6-8 hours. ? Peeing only a small amount of very dark pee in 6-8 hours.  Trouble breathing.  Symptoms that get worse with treatment. These symptoms may be an emergency. Do not wait to see if the symptoms will go away. Get medical help right away. Call your local emergency services (911 in the [place] Do not drive yourself to the hospital. Summary  Dehydration is a condition in which there is not enough water or other fluids in the body. This happens when a person loses more fluids than he or she takes in.  Treatment for this condition depends on how bad it is. Treatment should be started right away. Do not wait until your condition gets very bad.  Drink enough clear fluid to keep your pee pale yellow. If you were told to drink an oral rehydration solution (ORS), finish the ORS first. Then, start slowly drinking other clear fluids.  Take over-the-counter and prescription medicines only as told by your doctor.  Get help right away if you have any  symptoms of very bad dehydration. This information is not intended to replace advice given to you by your health care provider. Make sure you discuss any questions you have with your health care provider. Document Revised: 04/21/2019 Document Reviewed: 04/21/2019 Elsevier Patient Education  [place].

## 2020-04-05 NOTE — Telephone Encounter (Signed)
Patient wife called (hallie) and stated his blood pressure has been running low. 117/68, 108/59, 109/60. Can the bp medication be tried every other day. When he get up he is dizzy. Please advise

## 2020-04-06 NOTE — Telephone Encounter (Signed)
Patient was seen 7/15

## 2020-04-16 ENCOUNTER — Ambulatory Visit: Admit: 2020-04-16 | Discharge: 2020-04-17 | Payer: MEDICARE

## 2020-04-19 ENCOUNTER — Institutional Professional Consult (permissible substitution): Admit: 2020-04-19 | Discharge: 2020-04-20 | Payer: MEDICARE | Attending: Medical Oncology | Primary: Medical Oncology

## 2020-04-19 ENCOUNTER — Other Ambulatory Visit: Payer: Self-pay | Admitting: Family Medicine

## 2020-05-07 DIAGNOSIS — C61 Malignant neoplasm of prostate: Principal | ICD-10-CM

## 2020-05-07 DIAGNOSIS — C775 Secondary and unspecified malignant neoplasm of intrapelvic lymph nodes: Principal | ICD-10-CM

## 2020-05-15 DIAGNOSIS — C61 Malignant neoplasm of prostate: Principal | ICD-10-CM

## 2020-05-17 NOTE — Progress Notes (Signed)
Name: Kevin Donovan   MRN: [id number]    DOB: [birth year]   Date:05/18/2020       Progress Note  Subjective  Chief Complaint  Chief Complaint  Patient presents with  . Hypertension  . Hypothyroidism    HPI  Hypothyroidism: he is now taking 137 mcg daily TSH was still elevated, he states he is not noticed any changes. No dysphagia, diarrhea, palpitation, dry skin. We will recheck labs   HTN: he is taking medications daily, no chest pain or palpitation. BP is at goal today , but he is only taking half dose of benazepril since July, at the time he was having dizziness and bp was dropping to 100's   Prostate Cancer: he was diagnosed in 2008 with recurrence  2018diagnosed with metastatic cancer, and resumedtherapy May 2018,he did not respond to [place] he is now [place] and Lupron every 6 months, he is due for follow up next week, his PSA was up to 14 in July .   Senile purpura: stable, reassurance given again . Unchanged   Calcification abdominal aorta: found on CT abdomen, he has metastatic prostate cancer,he was bleeding andaspirinwas stopped because of rectal bleed.Not interested in taking statin therapy. Reviewed LDL done [facility] , we will recheck labs today   Depression: he states that since he started on medication ( Lexapro ) he has not noticed any difference in his behavior, however his wife has noticed a great improvement in his mood.Patient and wife are pleased with medication   Dyslipidemia: reviewed labs done [facility], done 12 /2020. TC 168  HDL 35, triglycerides down to 157, HDL was 43 and LDL was 94 . Discussed statin therapy again   Tobacco: he is not ready to quit smoking yet, he isstillsmokingbetween 3/4 pack daliy He no longer has a daily  cough , but occasionally has a dry cough and sometimes productive, no wheezing but has some SOB with moderate activity, he does not want to be tested  Hearing loss:he saw ENT and had some wax removed and had  hearing test and was advised to get a hearing aid, but he is not interested. Unchanged   Hyperglycemia: he denies polyphagia, polydipsia or polyuria   Paresthesia on hands: going on for months, constant, he thinks related to COVID-19 but explained unlikely, we will check B12 and folate and if normal, discussed NCS and EMG but he states no weakness or problems.   Patient Active Problem List   Diagnosis Date Noted  . Abnormal coagulation profile 01/28/2019  . Rectal bleeding 12/17/2018  . Hypothyroidism 12/09/2018  . Essential hypertension 10/22/2018  . Senile purpura ([place]) 01/28/2018  . Hot flashes 05/28/2017  . Fatigue 05/28/2017  . First degree AV block 11/28/2016  . Bradycardia on ECG 11/14/2016  . Situational depression 10/31/2016  . Calcification of abdominal aorta (HCC) 10/31/2016  . Degenerative disc disease, lumbar 10/31/2016  . Prostate cancer metastatic to intrapelvic lymph node ([place]) 10/23/2016  . Malignant neoplasm of prostate ([place]) 08/27/2012  . Enlarged prostate with lower urinary tract symptoms (LUTS) 08/27/2012    Past Surgical History:  Procedure Laterality Date  . APPENDECTOMY  2011  . COLONOSCOPY  2005    Family History  Problem Relation Age of Onset  . Lupus Mother   . Heart disease Mother        CHF  . Heart disease Father     Social History   Tobacco Use  . Smoking status: Current Every Day Smoker  Packs/day: 0.75    Years: 72.00    Pack years: 54.00    Types: Cigarettes    Start date: 01/29/1944  . Smokeless tobacco: Never Used  Substance Use Topics  . Alcohol use: No     Current Outpatient Medications:  .  alendronate (FOSAMAX) 70 MG tablet, Take 70 mg by mouth once a week., Disp: , Rfl:  .  benazepril (LOTENSIN) 10 MG tablet, TAKE 1 TABLET BY MOUTH ONCE DAILY, Disp: 90 tablet, Rfl: 1 .  escitalopram (LEXAPRO) 10 MG tablet, TAKE 1 TABLET BY MOUTH ONCE DAILY, Disp: 90 tablet, Rfl: 1 .  leuprolide (LUPRON) 22.5 MG injection, Inject 22.5  mg into the muscle every 6 (six) months. , Disp: , Rfl:  .  levothyroxine (SYNTHROID) 137 MCG tablet, TAKE 1 TABLET BY MOUTH ONCE DAILY ON AN EMPTY STOMACH. WAIT 30 MINUTES BEFORE TAKING OTHER MEDS., Disp: 30 tablet, Rfl: 1 .  Multiple Vitamin (MULTI-VITAMINS) TABS, Take by mouth., Disp: , Rfl:  .  tamsulosin (FLOMAX) 0.4 MG CAPS capsule, Take 1 capsule by mouth daily. 30 minutes after largest meal, Disp: , Rfl:  .  vitamin C (ASCORBIC ACID) 250 MG tablet, Take 250 mg by mouth daily., Disp: , Rfl:  .  XTANDI 40 MG capsule, Take 160 mg by mouth daily. , Disp: , Rfl:   Allergies  Allergen Reactions  . Diazepam Other (See Comments)    Hallucinations, altered mental status    I personally reviewed active problem list, medication list, allergies, family history, social history, health maintenance with the patient/caregiver today.   ROS  Constitutional: Negative for fever or weight change.  Respiratory: positive for cough and shortness of breath.   Cardiovascular: Negative for chest pain or palpitations.  Gastrointestinal: Negative for abdominal pain, no bowel changes.  Musculoskeletal: Negative for gait problem or joint swelling.  Skin: Negative for rash.  Neurological: Negative for dizziness or headache.  No other specific complaints in a complete review of systems (except as listed in HPI above).  Objective  Vitals:   05/18/20 1036  BP: 130/60  Pulse: 70  Resp: 16  Temp: 98.1 F (36.7 C)  TempSrc: Oral  SpO2: 99%  Weight: 183 lb 14.4 oz (83.4 kg)  Height: 5\' 10"  (1.778 m)    Body mass index is 26.39 kg/m.  Physical Exam  Constitutional: Patient appears well-developed and well-nourished. Overweight.  No distress.  HEENT: head atraumatic, normocephalic, pupils equal and reactive to light,neck supple Cardiovascular: Normal rate, regular rhythm and normal heart sounds.  No murmur heard. No BLE edema. Pulmonary/Chest: Effort normal and breath sounds normal. No respiratory  distress. Abdominal: Soft.  There is no tenderness. Psychiatric: Patient has a normal mood and affect. behavior is normal. Judgment and thought content normal.  PHQ2/9: Depression screen [facility] 2/9 05/18/2020 04/05/2020 02/15/2020 01/05/2020 11/15/2019  Decreased Interest 0 0 0 0 0  Down, Depressed, Hopeless 0 0 0 0 0  PHQ - 2 Score 0 0 0 0 0  Altered sleeping 0 0 0 - 0  Tired, decreased energy 2 0 1 - 0  Change in appetite 0 0 0 - 0  Feeling bad or failure about yourself  0 0 0 - 0  Trouble concentrating 0 0 1 - 0  Moving slowly or fidgety/restless 0 0 0 - 0  Suicidal thoughts 0 0 0 - 0  PHQ-9 Score 2 0 2 - 0  Difficult doing work/chores Somewhat difficult Not difficult at all Not difficult at all - Not  difficult at all  Some recent data might be hidden    phq 9 is negative   Fall Risk: Fall Risk  05/18/2020 05/18/2020 04/05/2020 02/15/2020 01/05/2020  Falls in the past year? 0 0 1 0 0  Number falls in past yr: 0 0 1 0 0  Injury with Fall? 0 0 0 0 0  Risk for fall due to : - - History of fall(s) - No Fall Risks  Follow up - - Falls evaluation completed Falls evaluation completed Falls prevention discussed    Functional Status Survey: Is the patient deaf or have difficulty hearing?: Yes Does the patient have difficulty seeing, even when wearing glasses/contacts?: No Does the patient have difficulty concentrating, remembering, or making decisions?: No Does the patient have difficulty walking or climbing stairs?: Yes Does the patient have difficulty dressing or bathing?: No Does the patient have difficulty doing errands alone such as visiting a doctor's office or shopping?: No    Assessment & Plan  1. Hypothyroidism, adult  - TSH  2. Senile purpura (HCC)  Stable   3. Prostate cancer metastatic to intrapelvic lymph node ([place])  Keep follow up with [facility]  4. Calcification of abdominal aorta (HCC)  - Lipid panel  5. Malignant neoplasm of prostate ([place])   6. Essential  hypertension  - benazepril (LOTENSIN) 5 MG tablet; Take 1 tablet (5 mg total) by mouth daily.  Dispense: 90 tablet; Refill: 1  7. Paresthesia of both hands  - B12 and Folate Panel  8. Hyperglycemia  - Hemoglobin A1c  9. Age-related osteoporosis without current pathological fracture  On Alendronate   10. Mild depression ([place])  Taking medication

## 2020-05-18 ENCOUNTER — Ambulatory Visit: Payer: Medicare HMO | Admitting: Family Medicine

## 2020-05-18 ENCOUNTER — Encounter: Payer: Self-pay | Admitting: Family Medicine

## 2020-05-18 ENCOUNTER — Other Ambulatory Visit: Payer: Self-pay

## 2020-05-18 VITALS — BP 130/60 | HR 70 | Temp 98.1°F | Resp 16 | Ht 70.0 in | Wt 183.9 lb

## 2020-05-18 DIAGNOSIS — R739 Hyperglycemia, unspecified: Secondary | ICD-10-CM

## 2020-05-18 DIAGNOSIS — I10 Essential (primary) hypertension: Secondary | ICD-10-CM

## 2020-05-18 DIAGNOSIS — M810 Age-related osteoporosis without current pathological fracture: Secondary | ICD-10-CM

## 2020-05-18 DIAGNOSIS — I700 Atherosclerosis of aorta: Secondary | ICD-10-CM | POA: Diagnosis not present

## 2020-05-18 DIAGNOSIS — E039 Hypothyroidism, unspecified: Secondary | ICD-10-CM

## 2020-05-18 DIAGNOSIS — C61 Malignant neoplasm of prostate: Secondary | ICD-10-CM | POA: Diagnosis not present

## 2020-05-18 DIAGNOSIS — C775 Secondary and unspecified malignant neoplasm of intrapelvic lymph nodes: Secondary | ICD-10-CM

## 2020-05-18 DIAGNOSIS — F320 Major depressive disorder, single episode, mild: Secondary | ICD-10-CM

## 2020-05-18 DIAGNOSIS — D692 Other nonthrombocytopenic purpura: Secondary | ICD-10-CM | POA: Diagnosis not present

## 2020-05-18 DIAGNOSIS — R202 Paresthesia of skin: Secondary | ICD-10-CM

## 2020-05-18 DIAGNOSIS — F32A Depression, unspecified: Secondary | ICD-10-CM

## 2020-05-18 MED ORDER — BENAZEPRIL HCL 5 MG PO TABS: 5.0000 mg | ORAL_TABLET | Freq: Every day | ORAL | 1 refills | Status: DC

## 2020-05-19 LAB — LIPID PANEL
Cholesterol: 146 mg/dL (ref <200)
HDL: 34 mg/dL — ABNORMAL LOW (ref >=40)
LDL Cholesterol (Calc): 87 mg/dL (calc)
Non-HDL Cholesterol (Calc): 112 mg/dL (calc) (ref <130)
Total CHOL/HDL Ratio: 4.3 (calc) (ref <5.0)
Triglycerides: 153 mg/dL — ABNORMAL HIGH (ref <150)

## 2020-05-19 LAB — HEMOGLOBIN A1C
Hgb A1c MFr Bld: 5.7 % of total Hgb — ABNORMAL HIGH (ref <5.7)
Mean Plasma Glucose: 117 (calc)
eAG (mmol/L): 6.5 (calc)

## 2020-05-19 LAB — B12 AND FOLATE PANEL
Folate: 19.2 ng/mL
Vitamin B-12: 1839 pg/mL — ABNORMAL HIGH (ref 200–1100)

## 2020-05-19 LAB — TSH: TSH: 0.99 mIU/L (ref 0.40–4.50)

## 2020-05-20 ENCOUNTER — Other Ambulatory Visit: Payer: Self-pay | Admitting: Family Medicine

## 2020-05-20 MED ORDER — LEVOTHYROXINE SODIUM 137 MCG PO TABS
ORAL_TABLET | ORAL | 1 refills | Status: DC
Start: 2020-05-20 — End: 2020-09-06

## 2020-05-21 DIAGNOSIS — C61 Malignant neoplasm of prostate: Principal | ICD-10-CM

## 2020-05-21 DIAGNOSIS — C775 Secondary and unspecified malignant neoplasm of intrapelvic lymph nodes: Principal | ICD-10-CM

## 2020-05-22 ENCOUNTER — Ambulatory Visit: Admit: 2020-05-22 | Discharge: 2020-05-23 | Payer: MEDICARE

## 2020-05-22 ENCOUNTER — Encounter: Admit: 2020-05-22 | Discharge: 2020-06-04 | Payer: MEDICARE | Attending: Medical Oncology | Primary: Medical Oncology

## 2020-05-22 ENCOUNTER — Ambulatory Visit: Admit: 2020-05-22 | Discharge: 2020-06-04 | Payer: MEDICARE

## 2020-05-22 ENCOUNTER — Ambulatory Visit: Admit: 2020-05-22 | Discharge: 2020-06-20 | Payer: MEDICARE

## 2020-05-24 ENCOUNTER — Ambulatory Visit
Admit: 2020-05-24 | Discharge: 2020-05-24 | Payer: MEDICARE | Attending: Radiation Oncology | Primary: Radiation Oncology

## 2020-05-24 ENCOUNTER — Ambulatory Visit: Admit: 2020-05-24 | Discharge: 2020-05-24 | Payer: MEDICARE | Attending: Medical Oncology | Primary: Medical Oncology

## 2020-05-24 DIAGNOSIS — C61 Malignant neoplasm of prostate: Principal | ICD-10-CM

## 2020-05-24 DIAGNOSIS — C775 Secondary and unspecified malignant neoplasm of intrapelvic lymph nodes: Secondary | ICD-10-CM

## 2020-06-13 ENCOUNTER — Other Ambulatory Visit: Payer: Self-pay | Admitting: Family Medicine

## 2020-06-13 DIAGNOSIS — F32A Depression, unspecified: Secondary | ICD-10-CM

## 2020-06-25 ENCOUNTER — Ambulatory Visit: Admit: 2020-06-25 | Payer: MEDICARE

## 2020-06-25 MED ORDER — XTANDI 40 MG TABLET
0 refills | 0 days
Start: 2020-06-25 — End: ?

## 2020-06-27 DIAGNOSIS — C61 Malignant neoplasm of prostate: Principal | ICD-10-CM

## 2020-06-27 DIAGNOSIS — C775 Secondary and unspecified malignant neoplasm of intrapelvic lymph nodes: Principal | ICD-10-CM

## 2020-06-27 MED ORDER — ENZALUTAMIDE 40 MG TABLET
ORAL_TABLET | Freq: Every day | ORAL | 0 refills | 0.00000 days | Status: CP
Start: 2020-06-27 — End: 2020-07-27

## 2020-06-28 ENCOUNTER — Ambulatory Visit: Admit: 2020-06-28 | Discharge: 2020-06-29 | Payer: MEDICARE | Attending: Medical Oncology | Primary: Medical Oncology

## 2020-06-28 DIAGNOSIS — C61 Malignant neoplasm of prostate: Principal | ICD-10-CM

## 2020-06-28 DIAGNOSIS — C775 Secondary and unspecified malignant neoplasm of intrapelvic lymph nodes: Secondary | ICD-10-CM

## 2020-07-10 DIAGNOSIS — R351 Nocturia: Principal | ICD-10-CM

## 2020-07-10 MED ORDER — TAMSULOSIN 0.4 MG CAPSULE
ORAL_CAPSULE | 3 refills | 0 days
Start: 2020-07-10 — End: ?

## 2020-07-11 MED ORDER — TAMSULOSIN 0.4 MG CAPSULE
ORAL_CAPSULE | 3 refills | 0 days | Status: CP
Start: 2020-07-11 — End: ?

## 2020-08-02 DIAGNOSIS — C61 Malignant neoplasm of prostate: Principal | ICD-10-CM

## 2020-08-02 DIAGNOSIS — C775 Secondary and unspecified malignant neoplasm of intrapelvic lymph nodes: Principal | ICD-10-CM

## 2020-08-21 ENCOUNTER — Encounter: Payer: Self-pay | Admitting: Family Medicine

## 2020-08-21 ENCOUNTER — Ambulatory Visit (INDEPENDENT_AMBULATORY_CARE_PROVIDER_SITE_OTHER): Payer: Medicare HMO | Admitting: Family Medicine

## 2020-08-21 ENCOUNTER — Other Ambulatory Visit: Payer: Self-pay

## 2020-08-21 VITALS — Ht 70.0 in | Wt 183.0 lb

## 2020-08-21 DIAGNOSIS — J0110 Acute frontal sinusitis, unspecified: Secondary | ICD-10-CM | POA: Diagnosis not present

## 2020-08-21 MED ORDER — AMOXICILLIN-POT CLAVULANATE 875-125 MG PO TABS: 1.0000 | ORAL_TABLET | Freq: Two times a day (BID) | ORAL | 0 refills | Status: DC

## 2020-08-21 NOTE — Progress Notes (Signed)
Name: Kevin Donovan   MRN: [id number]    DOB: 08/01/1936   Date:08/21/2020       Progress Note  Subjective  Chief Complaint  Chief Complaint  Patient presents with  . Cough    x3 weeks  . Headache  . Nasal Congestion    I connected with  Hewitt Blade Fredlund on 08/21/20 at  2:20 PM EST by telephone and verified that I am speaking with the correct person using two identifiers.  I discussed the limitations, risks, security and privacy concerns of performing an evaluation and management service by telephone and the availability of in person appointments. Staff also discussed with the patient that there may be a patient responsible charge related to this service. Patient agreed on having a virtual visit  Patient Location: at home  Provider Location: at [facility] - work  Additional Individuals present: wife in the background, history given only by patient   HPI  Sinus pressure: he states symptoms started 4-6  weeks ago , he states started with rhinorrhea, nasal congestion, sinus pressure, frontal headache, he also has post-nasal drainage that causes him to cough. No wheezing or sob. He has been using nasal saline , Tylenol prn . He denies chills or fever, no change in appetite, no lack of smells or taste He has ben vaccinated for COVID-19 and flu vaccine, no exposure to COVID-19 positive .  He states he decided to call because symptoms are not improving.    Patient Active Problem List   Diagnosis Date Noted  . Abnormal coagulation profile 01/28/2019  . Rectal bleeding 12/17/2018  . Hypothyroidism 12/09/2018  . Essential hypertension 10/22/2018  . Senile purpura ([place]) 01/28/2018  . Hot flashes 05/28/2017  . Fatigue 05/28/2017  . First degree AV block 11/28/2016  . Bradycardia on ECG 11/14/2016  . Situational depression 10/31/2016  . Calcification of abdominal aorta (HCC) 10/31/2016  . Degenerative disc disease, lumbar 10/31/2016  . Prostate cancer metastatic to intrapelvic lymph  node ([place]) 10/23/2016  . Malignant neoplasm of prostate ([place]) 08/27/2012  . Enlarged prostate with lower urinary tract symptoms (LUTS) 08/27/2012    Past Surgical History:  Procedure Laterality Date  . APPENDECTOMY  2011  . COLONOSCOPY  2005    Family History  Problem Relation Age of Onset  . Lupus Mother   . Heart disease Mother        CHF  . Heart disease Father     Social History   Tobacco Use  . Smoking status: Current Every Day Smoker    Packs/day: 0.75    Years: 72.00    Pack years: 54.00    Types: Cigarettes    Start date: 01/29/1944  . Smokeless tobacco: Never Used  Substance Use Topics  . Alcohol use: No     Current Outpatient Medications:  .  alendronate (FOSAMAX) 70 MG tablet, Take 70 mg by mouth once a week., Disp: , Rfl:  .  benazepril (LOTENSIN) 5 MG tablet, Take 1 tablet (5 mg total) by mouth daily., Disp: 90 tablet, Rfl: 1 .  escitalopram (LEXAPRO) 10 MG tablet, TAKE 1 TABLET BY MOUTH ONCE DAILY, Disp: 90 tablet, Rfl: 0 .  leuprolide (LUPRON) 22.5 MG injection, Inject 22.5 mg into the muscle every 6 (six) months. , Disp: , Rfl:  .  levothyroxine (SYNTHROID) 137 MCG tablet, TAKE 1 TABLET BY MOUTH ONCE DAILY ON AN EMPTY STOMACH. WAIT 30 MINUTES BEFORE TAKING OTHER MEDS., Disp: 90 tablet, Rfl: 1 .  Multiple Vitamin (  MULTI-VITAMINS) TABS, Take by mouth., Disp: , Rfl:  .  tamsulosin (FLOMAX) 0.4 MG CAPS capsule, Take 1 capsule by mouth daily. 30 minutes after largest meal, Disp: , Rfl:  .  vitamin C (ASCORBIC ACID) 250 MG tablet, Take 250 mg by mouth daily., Disp: , Rfl:  .  XTANDI 40 MG capsule, Take 160 mg by mouth daily. , Disp: , Rfl:   Allergies  Allergen Reactions  . Diazepam Other (See Comments)    Hallucinations, altered mental status    I personally reviewed active problem list, medication list, allergies, family history, social history, health maintenance with the patient/caregiver today.   ROS  Ten systems reviewed and is negative except as  mentioned in HPI   Objective  Vitals:   08/21/20 1008  Weight: 183 lb (83 kg)  Height: 5\' 10"  (1.778 m)   Virtual visit   Body mass index is 26.26 kg/m.  Physical Exam   Awake, alert and oriented   PHQ2/9: Depression screen [facility] 2/9 08/21/2020 05/18/2020 04/05/2020 02/15/2020 01/05/2020  Decreased Interest 0 0 0 0 0  Down, Depressed, Hopeless 3 0 0 0 0  PHQ - 2 Score 3 0 0 0 0  Altered sleeping 0 0 0 0 -  Tired, decreased energy 1 2 0 1 -  Change in appetite 0 0 0 0 -  Feeling bad or failure about yourself  0 0 0 0 -  Trouble concentrating 0 0 0 1 -  Moving slowly or fidgety/restless 0 0 0 0 -  Suicidal thoughts 0 0 0 0 -  PHQ-9 Score 4 2 0 2 -  Difficult doing work/chores - Somewhat difficult Not difficult at all Not difficult at all -  Some recent data might be hidden    phq 9 is positive   Fall Risk: Fall Risk  08/21/2020 05/18/2020 05/18/2020 04/05/2020 02/15/2020  Falls in the past year? 0 0 0 1 0  Number falls in past yr: 0 0 0 1 0  Injury with Fall? 0 0 0 0 0  Risk for fall due to : - - - History of fall(s) -  Follow up - - - Falls evaluation completed Falls evaluation completed     Assessment & Plan   1. Subacute frontal sinusitis  - amoxicillin-clavulanate (AUGMENTIN) 875-125 MG tablet; Take 1 tablet by mouth 2 (two) times daily.  Dispense: 20 tablet; Refill: 0  Fluids and rest  I discussed the assessment and treatment plan with the patient. The patient was provided an opportunity to ask questions and all were answered. The patient agreed with the plan and demonstrated an understanding of the instructions.   The patient was advised to call back or seek an in-person evaluation if the symptoms worsen or if the condition fails to improve as anticipated.  I provided 15 minutes of non-face-to-face time during this encounter.  Loistine Chance, MD

## 2020-08-28 ENCOUNTER — Ambulatory Visit: Admit: 2020-08-28 | Discharge: 2020-08-29 | Payer: MEDICARE

## 2020-08-28 DIAGNOSIS — C775 Secondary and unspecified malignant neoplasm of intrapelvic lymph nodes: Principal | ICD-10-CM

## 2020-08-28 DIAGNOSIS — C61 Malignant neoplasm of prostate: Principal | ICD-10-CM

## 2020-08-30 ENCOUNTER — Ambulatory Visit: Admit: 2020-08-30 | Discharge: 2020-08-31 | Payer: MEDICARE | Attending: Medical Oncology | Primary: Medical Oncology

## 2020-08-30 ENCOUNTER — Ambulatory Visit: Admit: 2020-08-30 | Discharge: 2020-08-31 | Payer: MEDICARE

## 2020-08-30 DIAGNOSIS — C775 Secondary and unspecified malignant neoplasm of intrapelvic lymph nodes: Principal | ICD-10-CM

## 2020-08-30 DIAGNOSIS — C61 Malignant neoplasm of prostate: Principal | ICD-10-CM

## 2020-09-05 DIAGNOSIS — M8580 Other specified disorders of bone density and structure, unspecified site: Principal | ICD-10-CM

## 2020-09-06 ENCOUNTER — Other Ambulatory Visit: Payer: Self-pay | Admitting: Family Medicine

## 2020-09-06 DIAGNOSIS — M8580 Other specified disorders of bone density and structure, unspecified site: Principal | ICD-10-CM

## 2020-09-06 DIAGNOSIS — F32A Depression, unspecified: Secondary | ICD-10-CM

## 2020-09-10 MED ORDER — ALENDRONATE 70 MG TABLET
ORAL_TABLET | ORAL | 0 refills | 357.00000 days | Status: CP
Start: 2020-09-10 — End: 2021-09-10

## 2020-09-25 ENCOUNTER — Ambulatory Visit
Admission: RE | Admit: 2020-09-25 | Discharge: 2020-09-25 | Disposition: A | Discharge status: Home / Self Care | Payer: Medicare HMO | Attending: Family Medicine | Admitting: Family Medicine

## 2020-09-25 ENCOUNTER — Ambulatory Visit (INDEPENDENT_AMBULATORY_CARE_PROVIDER_SITE_OTHER): Payer: Self-pay | Admitting: Family Medicine

## 2020-09-25 ENCOUNTER — Encounter: Payer: Self-pay | Admitting: Family Medicine

## 2020-09-25 ENCOUNTER — Ambulatory Visit
Admission: RE | Admit: 2020-09-25 | Discharge: 2020-09-25 | Disposition: A | Discharge status: Home / Self Care | Payer: Medicare HMO | Source: Ambulatory Visit | Attending: Family Medicine | Admitting: Family Medicine

## 2020-09-25 ENCOUNTER — Other Ambulatory Visit: Payer: Self-pay

## 2020-09-25 VITALS — BP 136/72 | HR 74 | Temp 97.7°F | Resp 20 | Ht 70.0 in | Wt 178.7 lb

## 2020-09-25 DIAGNOSIS — R0789 Other chest pain: Secondary | ICD-10-CM | POA: Insufficient documentation

## 2020-09-25 MED ORDER — MELOXICAM 7.5 MG PO TABS: 7.5000 mg | ORAL_TABLET | Freq: Every day | ORAL | 0 refills | Status: DC
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
## 2020-09-27 ENCOUNTER — Ambulatory Visit: Payer: Medicare HMO | Admitting: Family Medicine

## 2020-10-22 DIAGNOSIS — C775 Secondary and unspecified malignant neoplasm of intrapelvic lymph nodes: Principal | ICD-10-CM

## 2020-10-22 DIAGNOSIS — C61 Malignant neoplasm of prostate: Principal | ICD-10-CM

## 2020-10-23 ENCOUNTER — Ambulatory Visit: Admit: 2020-10-23 | Discharge: 2020-11-05 | Payer: MEDICARE

## 2020-10-23 ENCOUNTER — Ambulatory Visit: Admit: 2020-10-23 | Discharge: 2020-11-21 | Payer: MEDICARE

## 2020-10-23 DIAGNOSIS — F17200 Nicotine dependence, unspecified, uncomplicated: Principal | ICD-10-CM

## 2020-10-23 DIAGNOSIS — I10 Essential (primary) hypertension: Principal | ICD-10-CM

## 2020-10-23 DIAGNOSIS — Z192 Hormone resistant malignancy status: Principal | ICD-10-CM

## 2020-10-23 DIAGNOSIS — C61 Malignant neoplasm of prostate: Principal | ICD-10-CM

## 2020-10-23 DIAGNOSIS — Z923 Personal history of irradiation: Principal | ICD-10-CM

## 2020-10-23 DIAGNOSIS — Z66 Do not resuscitate: Principal | ICD-10-CM

## 2020-10-23 DIAGNOSIS — I2699 Other pulmonary embolism without acute cor pulmonale: Principal | ICD-10-CM

## 2020-10-23 DIAGNOSIS — J90 Pleural effusion, not elsewhere classified: Principal | ICD-10-CM

## 2020-10-23 DIAGNOSIS — Z9221 Personal history of antineoplastic chemotherapy: Principal | ICD-10-CM

## 2020-10-23 DIAGNOSIS — J9811 Atelectasis: Principal | ICD-10-CM

## 2020-10-23 DIAGNOSIS — C778 Secondary and unspecified malignant neoplasm of lymph nodes of multiple regions: Principal | ICD-10-CM

## 2020-10-23 DIAGNOSIS — I444 Left anterior fascicular block: Principal | ICD-10-CM

## 2020-10-23 DIAGNOSIS — R9431 Abnormal electrocardiogram [ECG] [EKG]: Principal | ICD-10-CM

## 2020-10-23 DIAGNOSIS — I440 Atrioventricular block, first degree: Principal | ICD-10-CM

## 2020-10-23 DIAGNOSIS — Z862 Personal history of diseases of the blood and blood-forming organs and certain disorders involving the immune mechanism: Principal | ICD-10-CM

## 2020-10-23 DIAGNOSIS — C775 Secondary and unspecified malignant neoplasm of intrapelvic lymph nodes: Principal | ICD-10-CM

## 2020-10-23 MED ORDER — ELIQUIS DVT-PE TREATMENT 30-DAY STARTER 5 MG (74 TABLETS) IN DOSE PACK
ORAL_TABLET | 0 refills | 0 days | Status: CP
Start: 2020-10-23 — End: ?

## 2020-10-25 ENCOUNTER — Ambulatory Visit: Admit: 2020-10-25 | Discharge: 2020-10-26 | Payer: MEDICARE | Attending: Medical Oncology | Primary: Medical Oncology

## 2020-10-25 DIAGNOSIS — N1330 Unspecified hydronephrosis: Principal | ICD-10-CM

## 2020-10-25 DIAGNOSIS — Z7901 Long term (current) use of anticoagulants: Principal | ICD-10-CM

## 2020-10-25 DIAGNOSIS — Z79818 Long term (current) use of other agents affecting estrogen receptors and estrogen levels: Principal | ICD-10-CM

## 2020-10-25 DIAGNOSIS — S2222XA Fracture of body of sternum, initial encounter for closed fracture: Principal | ICD-10-CM

## 2020-10-25 DIAGNOSIS — C775 Secondary and unspecified malignant neoplasm of intrapelvic lymph nodes: Principal | ICD-10-CM

## 2020-10-25 DIAGNOSIS — R599 Enlarged lymph nodes, unspecified: Principal | ICD-10-CM

## 2020-10-25 DIAGNOSIS — K769 Liver disease, unspecified: Principal | ICD-10-CM

## 2020-10-25 DIAGNOSIS — Z7983 Long term (current) use of bisphosphonates: Principal | ICD-10-CM

## 2020-10-25 DIAGNOSIS — C61 Malignant neoplasm of prostate: Principal | ICD-10-CM

## 2020-10-25 MED ORDER — APIXABAN 5 MG TABLET
ORAL_TABLET | Freq: Two times a day (BID) | ORAL | 11 refills | 30 days | Status: CP
Start: 2020-10-25 — End: ?

## 2020-11-01 ENCOUNTER — Other Ambulatory Visit: Payer: Self-pay | Admitting: Family Medicine

## 2020-11-01 DIAGNOSIS — R0789 Other chest pain: Secondary | ICD-10-CM
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
## 2020-11-02 ENCOUNTER — Other Ambulatory Visit: Payer: Self-pay

## 2020-11-12 MED ORDER — MELOXICAM 7.5 MG TABLET
0 days
Start: 2020-11-12 — End: ?

## 2020-11-14 ENCOUNTER — Telehealth: Payer: Self-pay

## 2020-11-14 NOTE — Telephone Encounter (Signed)
Spoke to patient and advise her to take patient to ER. Wife stated at this time patient is refusing to go. She stated she will try to get him there later this afternoon

## 2020-11-14 NOTE — Telephone Encounter (Signed)
Pts wife called stating pt has a swollen leg, black toes and a black heel. Pts wife was advised to go to the ER. Pts wife stated she will try and get him to go this afternoon.

## 2020-11-15 NOTE — Progress Notes (Signed)
Name: Kevin Donovan   MRN: [id number]    DOB: 03-21-1936   Date:11/19/2020       Progress Note  Subjective  Chief Complaint  Follow up   HPI    Hypothyroidism: he is now taking 137 mcg daily TSH was at goal last, losing weight, discussed rechecking it today, but since he will go back to [facility] we will hold off   PAD: he noticed mottled skin , pain and purple foot 8 day ago, started suddenly. He went to [facility] and was advised to stay for further evaluation, but he states he had to go home to organize his life. He plans on going back to [facility] this Wednesday. Pain is described as aching at times other areas he is not sure how to describe it " just aggravating" He is not sure what makes it better or worse. He was started on Eliquis   HTN: he is taking medications daily, no chest pain or palpitation. BP is at goal today , but he is only taking half dose of benazepril since July, at the time he was having dizziness and bp was dropping to 100's   Prostate Cancer: he was diagnosed in 2008 with recurrence  2018diagnosed with metastatic cancer, and resumedtherapy May 2018,he did not respond to [place] he was   Xtandi and Lupron, however during his last visit with oncologist, he was found to have more metastasis to lymphonodi and bone ad Xtandi stopped, he is on terminal stages - given 6-12 months to live   Senile purpura: stable, reassurance given again. Unchanged   Calcification abdominal aorta: found on CT abdomen, he has metastatic prostate cancer,he was bleeding andaspirinwas stopped because of rectal bleed.Not interested in taking statin therapy. He is on palliative care of cancer   Depression: he states that since he started on medication ( Lexapro ) he has not noticed any difference in his behavior, however his wife has noticed a great improvement in his mood.Patient and wife are pleased with medication , he is aware of terminal diagnosis but states not bothering him, he has  been preparing self for it   Dyslipidemia:Last LDL 87, he is on palliative care for cancer now   Tobacco: he is not ready to quit smoking yet, he isstillsmokingbetween half to 3/4 pack daliy He has an occasional productive cough, no sob or wheezing   Hyperglycemia: he denies polyphagia, polydipsia or polyuria , last A1C was in the pre-diabetes range   Paresthesia on hands: doing better, mild on right hand only   Malnutrition: terminal cancer, lack of appetite, rapid weight loss   Patient Active Problem List   Diagnosis Date Noted  . Abnormal coagulation profile 01/28/2019  . Rectal bleeding 12/17/2018  . Hypothyroidism 12/09/2018  . Essential hypertension 10/22/2018  . Senile purpura ([place]) 01/28/2018  . Hot flashes 05/28/2017  . Fatigue 05/28/2017  . First degree AV block 11/28/2016  . Bradycardia on ECG 11/14/2016  . Situational depression 10/31/2016  . Calcification of abdominal aorta (HCC) 10/31/2016  . Degenerative disc disease, lumbar 10/31/2016  . Prostate cancer metastatic to intrapelvic lymph node ([place]) 10/23/2016  . Malignant neoplasm of prostate ([place]) 08/27/2012  . Enlarged prostate with lower urinary tract symptoms (LUTS) 08/27/2012    Past Surgical History:  Procedure Laterality Date  . APPENDECTOMY  2011  . COLONOSCOPY  2005    Family History  Problem Relation Age of Onset  . Lupus Mother   . Heart disease Mother  CHF  . Heart disease Father     Social History   Tobacco Use  . Smoking status: Current Every Day Smoker    Packs/day: 0.75    Years: 72.00    Pack years: 54.00    Types: Cigarettes    Start date: 01/29/1944  . Smokeless tobacco: Never Used  . Tobacco comment: cutting down   Substance Use Topics  . Alcohol use: No     Current Outpatient Medications:  .  alendronate (FOSAMAX) 70 MG tablet, Take 70 mg by mouth once a week., Disp: , Rfl:  .  apixaban (ELIQUIS) 5 MG TABS tablet, Take by mouth., Disp: , Rfl:  .  leuprolide  (LUPRON) 22.5 MG injection, Inject 22.5 mg into the muscle every 6 (six) months. , Disp: , Rfl:  .  Multiple Vitamin (MULTI-VITAMINS) TABS, Take by mouth., Disp: , Rfl:  .  tamsulosin (FLOMAX) 0.4 MG CAPS capsule, Take 0.4 mg by mouth daily., Disp: , Rfl:  .  vitamin C (ASCORBIC ACID) 250 MG tablet, Take 250 mg by mouth daily., Disp: , Rfl:  .  benazepril (LOTENSIN) 5 MG tablet, Take 1 tablet (5 mg total) by mouth daily., Disp: 90 tablet, Rfl: 1 .  escitalopram (LEXAPRO) 10 MG tablet, Take 1 tablet (10 mg total) by mouth daily., Disp: 90 tablet, Rfl: 0 .  levothyroxine (SYNTHROID) 137 MCG tablet, Take 1 tablet (137 mcg total) by mouth daily before breakfast., Disp: 90 tablet, Rfl: 0  Allergies  Allergen Reactions  . Diazepam Other (See Comments)    Hallucinations, altered mental status    I personally reviewed active problem list, medication list, allergies, family history, social history, health maintenance with the patient/caregiver today.   ROS  Constitutional: Negative for fever , positive for  weight change.  Respiratory: Negative for cough and shortness of breath.   Cardiovascular: Negative for chest pain or palpitations.  Gastrointestinal: Negative for abdominal pain, no bowel changes.  Musculoskeletal: positive for gait problem but no joint swelling.  Skin: left foot looks purple  Neurological: Negative for dizziness or headache.  No other specific complaints in a complete review of systems (except as listed in HPI above).  Objective  Vitals:   11/19/20 0953  BP: (!) 142/82  Pulse: 100  Resp: 16  Temp: 98 F (36.7 C)  TempSrc: Oral  SpO2: 98%  Weight: 167 lb 14.4 oz (76.2 kg)  Height: 5\' 10"  (1.778 m)    Body mass index is 24.09 kg/m.  Physical Exam  Constitutional: Patient appears well-developed, he has temporal waisting . No distress.  HEENT: head atraumatic, normocephalic, pupils equal and reactive to light,neck supple Cardiovascular: Normal rate, regular  rhythm and normal heart sounds.  No murmur heard. No BLE edema. Pulmonary/Chest: Effort normal and breath sounds normal. No respiratory distress. Abdominal: Soft.  There is no tenderness. Skin: decreased in distal pulse, left foot is purple on soles, mottle on top of foot Psychiatric: Patient has a normal mood and affect. behavior is normal. Judgment and thought content normal.  PHQ2/9: Depression screen [facility] 2/9 11/19/2020 09/25/2020 08/21/2020 05/18/2020 04/05/2020  Decreased Interest 0 0 0 0 0  Down, Depressed, Hopeless 0 0 3 0 0  PHQ - 2 Score 0 0 3 0 0  Altered sleeping - - 0 0 0  Tired, decreased energy - - 1 2 0  Change in appetite - - 0 0 0  Feeling bad or failure about yourself  - - 0 0 0  Trouble concentrating - -  0 0 0  Moving slowly or fidgety/restless - - 0 0 0  Suicidal thoughts - - 0 0 0  PHQ-9 Score - - 4 2 0  Difficult doing work/chores - - - Somewhat difficult Not difficult at all  Some recent data might be hidden    phq 9 is negative   Fall Risk: Fall Risk  11/19/2020 09/25/2020 08/21/2020 05/18/2020 05/18/2020  Falls in the past year? 0 0 0 0 0  Number falls in past yr: 0 0 0 0 0  Injury with Fall? 0 0 0 0 0  Risk for fall due to : - - - - -  Follow up - - - - -     Functional Status Survey: Is the patient deaf or have difficulty hearing?: Yes Does the patient have difficulty seeing, even when wearing glasses/contacts?: No Does the patient have difficulty concentrating, remembering, or making decisions?: No Does the patient have difficulty walking or climbing stairs?: Yes Does the patient have difficulty dressing or bathing?: No Does the patient have difficulty doing errands alone such as visiting a doctor's office or shopping?: No    Assessment & Plan  1. Benign hypertension with chronic kidney disease, stage III ([place])  Reviewed and discussed last labs done at [facility]  2. Stage 3a chronic kidney disease ([place])  Reminded him to avoid NSAID"s   3.  Claudication [facility])  With signs of vascular compromise on left foot, explained importance of going back to [facility] for admission today, not to wait until Wednesday   4. Senile purpura (HCC)  Stable   5. Malignant neoplasm of prostate ([place])  Terminal   6. Essential hypertension  - benazepril (LOTENSIN) 5 MG tablet; Take 1 tablet (5 mg total) by mouth daily.  Dispense: 90 tablet; Refill: 1  7. Hypothyroidism, adult  Lost a lot of weight, last TSH normal, discussed rechecking but since he is going back to [facility] we will hold off for now   8. Calcification of abdominal aorta (HCC)   9. Age-related osteoporosis without current pathological fracture   10. Tobacco abuse   11. Mild depression (HCC)  - escitalopram (LEXAPRO) 10 MG tablet; Take 1 tablet (10 mg total) by mouth daily.  Dispense: 90 tablet; Refill: 0  12. Malignant neoplasm metastatic to intrapelvic lymph node ([place])  And per patient also bone   13. Mild protein-calorie malnutrition ([place])  He is pale, lost over 10 lbs and has temporal waisting, he states feels full quickly and lack of appetite, likely from cancer

## 2020-11-16 ENCOUNTER — Emergency Department: Admit: 2020-11-16 | Discharge: 2020-11-17 | Discharge status: Against Medical Advice | Payer: MEDICARE

## 2020-11-16 ENCOUNTER — Ambulatory Visit: Admit: 2020-11-16 | Discharge: 2020-11-17 | Discharge status: Against Medical Advice | Payer: MEDICARE

## 2020-11-16 DIAGNOSIS — R231 Pallor: Principal | ICD-10-CM

## 2020-11-16 DIAGNOSIS — I7090 Unspecified atherosclerosis: Principal | ICD-10-CM

## 2020-11-19 ENCOUNTER — Ambulatory Visit: Admit: 2020-11-19 | Discharge: 2020-11-20 | Payer: MEDICARE

## 2020-11-19 ENCOUNTER — Other Ambulatory Visit: Payer: Self-pay

## 2020-11-19 ENCOUNTER — Encounter: Payer: Self-pay | Admitting: Family Medicine

## 2020-11-19 ENCOUNTER — Ambulatory Visit (INDEPENDENT_AMBULATORY_CARE_PROVIDER_SITE_OTHER): Payer: Medicare HMO | Admitting: Family Medicine

## 2020-11-19 VITALS — BP 142/82 | HR 100 | Temp 98.0°F | Resp 16 | Ht 70.0 in | Wt 167.9 lb

## 2020-11-19 DIAGNOSIS — D692 Other nonthrombocytopenic purpura: Secondary | ICD-10-CM

## 2020-11-19 DIAGNOSIS — I739 Peripheral vascular disease, unspecified: Secondary | ICD-10-CM

## 2020-11-19 DIAGNOSIS — N183 Chronic kidney disease, stage 3 unspecified: Secondary | ICD-10-CM

## 2020-11-19 DIAGNOSIS — M810 Age-related osteoporosis without current pathological fracture: Secondary | ICD-10-CM

## 2020-11-19 DIAGNOSIS — I129 Hypertensive chronic kidney disease with stage 1 through stage 4 chronic kidney disease, or unspecified chronic kidney disease: Secondary | ICD-10-CM

## 2020-11-19 DIAGNOSIS — E039 Hypothyroidism, unspecified: Secondary | ICD-10-CM

## 2020-11-19 DIAGNOSIS — F32A Depression, unspecified: Secondary | ICD-10-CM

## 2020-11-19 DIAGNOSIS — I10 Essential (primary) hypertension: Secondary | ICD-10-CM

## 2020-11-19 DIAGNOSIS — Z720 Tobacco use: Secondary | ICD-10-CM

## 2020-11-19 DIAGNOSIS — I700 Atherosclerosis of aorta: Secondary | ICD-10-CM

## 2020-11-19 DIAGNOSIS — C61 Malignant neoplasm of prostate: Secondary | ICD-10-CM

## 2020-11-19 DIAGNOSIS — F320 Major depressive disorder, single episode, mild: Secondary | ICD-10-CM

## 2020-11-19 DIAGNOSIS — N1831 Chronic kidney disease, stage 3a: Secondary | ICD-10-CM | POA: Diagnosis not present

## 2020-11-19 DIAGNOSIS — E441 Mild protein-calorie malnutrition: Secondary | ICD-10-CM

## 2020-11-19 DIAGNOSIS — C775 Secondary and unspecified malignant neoplasm of intrapelvic lymph nodes: Secondary | ICD-10-CM

## 2020-11-19 MED ORDER — LEVOTHYROXINE 137 MCG TABLET
0 days
Start: 2020-11-19 — End: ?

## 2020-11-19 MED ORDER — ESCITALOPRAM OXALATE 10 MG PO TABS: 10.0000 mg | ORAL_TABLET | Freq: Every day | ORAL | 0 refills | Status: AC

## 2020-11-19 MED ORDER — BENAZEPRIL HCL 5 MG PO TABS
5.0000 mg | ORAL_TABLET | Freq: Every day | ORAL | 1 refills | Status: AC
Start: 2020-11-19 — End: ?

## 2020-11-19 MED ORDER — LEVOTHYROXINE SODIUM 137 MCG PO TABS: 137.0000 ug | ORAL_TABLET | Freq: Every day | ORAL | 0 refills | Status: AC

## 2020-11-19 NOTE — Patient Instructions (Signed)
Kevin Donovan

## 2020-11-20 DIAGNOSIS — M79672 Pain in left foot: Secondary | ICD-10-CM | POA: Insufficient documentation

## 2020-11-20 DIAGNOSIS — L819 Disorder of pigmentation, unspecified: Secondary | ICD-10-CM | POA: Insufficient documentation

## 2020-11-20 DIAGNOSIS — R7989 Other specified abnormal findings of blood chemistry: Secondary | ICD-10-CM | POA: Insufficient documentation

## 2020-11-20 MED ORDER — ACETAMINOPHEN 325 MG TABLET
Freq: Four times a day (QID) | ORAL | 0 refills | 0 days | PRN
Start: 2020-11-20 — End: ?

## 2020-11-20 MED ORDER — NITROGLYCERIN 2 % TRANSDERMAL OINTMENT
Freq: Three times a day (TID) | TRANSDERMAL | 0 refills | 8 days | Status: CP
Start: 2020-11-20 — End: 2020-11-27

## 2020-11-21 ENCOUNTER — Telehealth: Payer: Self-pay | Admitting: Emergency Medicine

## 2020-11-21 NOTE — Telephone Encounter (Signed)
Patient was seen at [facility] ER only for 2 days. Was given nitro patch on leg and that was it. Patient wife stated he is in a lot of pain. Not sure if cancer has spread. Very unsteady on feet.  Er at [facility] stated that it was not a blockage or PE. Not sure what it was. Appointment at end of March for follow up and xrays. He only have meloxicam for pain. Is there anything else he can take.

## 2020-11-22 ENCOUNTER — Other Ambulatory Visit: Payer: Self-pay | Admitting: Family Medicine

## 2020-11-22 DIAGNOSIS — G893 Neoplasm related pain (acute) (chronic): Secondary | ICD-10-CM

## 2020-11-22 MED ORDER — TRAMADOL 50 MG TABLET
Freq: Three times a day (TID) | ORAL | 0 days | PRN
Start: 2020-11-22 — End: ?

## 2020-11-22 MED ORDER — TRAMADOL HCL 50 MG PO TABS: 50.0000 mg | ORAL_TABLET | Freq: Three times a day (TID) | ORAL | 0 refills | Status: DC | PRN

## 2020-11-22 NOTE — Progress Notes (Signed)
Name: Kevin Donovan   MRN: [id number]    DOB: 10-Oct-1935   Date:11/26/2020       Progress Note  Subjective  Chief Complaint  Discuss Medication  HPI  Hospital discharge follow up  Admission date: 11/19/2020 at [facility] Discharge date 11/21/2020 Transition of care call done 02/28 by [facility]  Medication reconciliation done during his office visit today  Discharge diagnosis: left foot pain and discoloration  He developed acute onset of left foot pain and discoloration mid Feb, he went to [facility] on 11/16/2020 and was advised to be admitted and placed on heparin drip but he left AMA, he was sent home on Eliquis. He saw me on  11/19/2020 and was advised to return to [facility] since he was developing pain and skin of left foot was showing more parlor and areas of mottled skin with toes being more ecchymotic . During the admission, he was given 1 day of Doxy and Keflex , labs were done to rule out rheumatological disease, also HIV and hepatitis C negative. He was sent home with NTG paste on left lower leg. Studies did not show a clear cause of his skin changes. He called [place] after his discharge due to increase in pain and inability to sleep. We gave him Tramadol, he states it took a few days for medication to start working but last night he slept well and pain is only mild at this time. He has follow up with oncology at the end of this month and prefers waiting to have labs re-checked. ( kidney function was down) , on discharge summary it was mentioned we could try Pletal if needed and we discussed with patient today. We will control his pain with tramadol and pletal.    CT aorta and iliofermoral runoff done at [facility] 11/19/2020   Vascular findings:  -Short segment focal occlusion of the distal left superficial femoral artery.  -Three vessel run off bilatearlly   Patient Active Problem List   Diagnosis Date Noted  . Discoloration of skin of foot 11/20/2020  . Elevated serum creatinine 11/20/2020  . Left foot  pain 11/20/2020  . Abnormal coagulation profile 01/28/2019  . Rectal bleeding 12/17/2018  . Hypothyroidism 12/09/2018  . Essential hypertension 10/22/2018  . Senile purpura ([place]) 01/28/2018  . Hot flashes 05/28/2017  . Fatigue 05/28/2017  . First degree AV block 11/28/2016  . Bradycardia on ECG 11/14/2016  . Situational depression 10/31/2016  . Calcification of abdominal aorta (HCC) 10/31/2016  . Degenerative disc disease, lumbar 10/31/2016  . Prostate cancer metastatic to intrapelvic lymph node ([place]) 10/23/2016  . Malignant neoplasm of prostate ([place]) 08/27/2012  . Enlarged prostate with lower urinary tract symptoms (LUTS) 08/27/2012    Past Surgical History:  Procedure Laterality Date  . APPENDECTOMY  2011  . COLONOSCOPY  2005    Family History  Problem Relation Age of Onset  . Lupus Mother   . Heart disease Mother        CHF  . Heart disease Father     Social History   Tobacco Use  . Smoking status: Current Every Day Smoker    Packs/day: 0.75    Years: 72.00    Pack years: 54.00    Types: Cigarettes    Start date: 01/29/1944  . Smokeless tobacco: Never Used  . Tobacco comment: cutting down   Substance Use Topics  . Alcohol use: No     Current Outpatient Medications:  .  acetaminophen (TYLENOL) 325 MG tablet, Take  by mouth., Disp: , Rfl:  .  alendronate (FOSAMAX) 70 MG tablet, Take 70 mg by mouth once a week., Disp: , Rfl:  .  apixaban (ELIQUIS) 5 MG TABS tablet, Take by mouth., Disp: , Rfl:  .  baclofen (LIORESAL) 10 MG tablet, Take 1 tablet (10 mg total) by mouth at bedtime., Disp: 30 each, Rfl: 0 .  benazepril (LOTENSIN) 5 MG tablet, Take 1 tablet (5 mg total) by mouth daily., Disp: 90 tablet, Rfl: 1 .  escitalopram (LEXAPRO) 10 MG tablet, Take 1 tablet (10 mg total) by mouth daily., Disp: 90 tablet, Rfl: 0 .  leuprolide (LUPRON) 22.5 MG injection, Inject 22.5 mg into the muscle every 6 (six) months. , Disp: , Rfl:  .  levothyroxine (SYNTHROID) 137 MCG  tablet, Take 1 tablet (137 mcg total) by mouth daily before breakfast., Disp: 90 tablet, Rfl: 0 .  meloxicam (MOBIC) 7.5 MG tablet, , Disp: , Rfl:  .  Multiple Vitamin (MULTI-VITAMINS) TABS, Take by mouth., Disp: , Rfl:  .  nitroGLYCERIN (NITROGLYN) 2 % ointment, Place onto the skin., Disp: , Rfl:  .  tamsulosin (FLOMAX) 0.4 MG CAPS capsule, Take 0.4 mg by mouth daily., Disp: , Rfl:  .  traMADol (ULTRAM) 50 MG tablet, Take 1 tablet (50 mg total) by mouth every 8 (eight) hours as needed for up to 5 days., Disp: 30 tablet, Rfl: 0 .  vitamin C (ASCORBIC ACID) 250 MG tablet, Take 250 mg by mouth daily., Disp: , Rfl:   Allergies  Allergen Reactions  . Diazepam Other (See Comments)    Hallucinations, altered mental status    I personally reviewed active problem list, medication list, allergies, family history, social history, health maintenance with the patient/caregiver today.   ROS  Constitutional: Negative for fever or weight change.  Respiratory:positive  for cough and shortness of breath.   Cardiovascular: Negative for chest pain or palpitations.  Gastrointestinal: Negative for abdominal pain, no bowel changes.  Musculoskeletal: positive for gait problem but no  joint swelling.  Skin: positive  for rash.  Neurological: Negative for dizziness or headache.  No other specific complaints in a complete review of systems (except as listed in HPI above).  Objective  Vitals:   11/26/20 1133  BP: 136/78  Pulse: 97  Resp: 16  Temp: 98.1 F (36.7 C)  TempSrc: Oral  SpO2: 98%  Weight: 167 lb (75.8 kg)  Height: 5\' 10"  (1.778 m)    Body mass index is 23.96 kg/m.  Physical Exam  Constitutional: Patient appears well-developed and pale .  No distress.  HEENT: head atraumatic, normocephalic, pupils equal and reactive to light, neck supple Cardiovascular: Normal rate, regular rhythm and normal heart sounds.  No murmur heard. 1 plus left lower extremity edema and trace on right  . Pulmonary/Chest: Effort normal and breath sounds normal. No respiratory distress. Abdominal: Soft.  There is no tenderness. Psychiatric: Patient has a normal mood and affect. behavior is normal. Judgment and thought content normal. Skin: mottled, now with blister on left foot, weak distal PD on left , failed monofilament test on left foot   PHQ2/9: Depression screen [facility] 2/9 11/26/2020 11/19/2020 09/25/2020 08/21/2020 05/18/2020  Decreased Interest 0 0 0 0 0  Down, Depressed, Hopeless 0 1 0 3 0  PHQ - 2 Score 0 1 0 3 0  Altered sleeping 0 0 - 0 0  Tired, decreased energy 1 2 - 1 2  Change in appetite 1 3 - 0 0  Feeling bad  or failure about yourself  0 0 - 0 0  Trouble concentrating 0 0 - 0 0  Moving slowly or fidgety/restless 0 0 - 0 0  Suicidal thoughts 0 0 - 0 0  PHQ-9 Score 2 6 - 4 2  Difficult doing work/chores - - - - Somewhat difficult  Some recent data might be hidden    phq 9 is negative   Fall Risk: Fall Risk  11/26/2020 11/19/2020 09/25/2020 08/21/2020 05/18/2020  Falls in the past year? 0 0 0 0 0  Number falls in past yr: 0 0 0 0 0  Injury with Fall? 0 0 0 0 0  Risk for fall due to : - - - - -  Follow up - - - - -      Functional Status Survey: Is the patient deaf or have difficulty hearing?: Yes Does the patient have difficulty seeing, even when wearing glasses/contacts?: No Does the patient have difficulty concentrating, remembering, or making decisions?: No Does the patient have difficulty walking or climbing stairs?: Yes Does the patient have difficulty dressing or bathing?: No Does the patient have difficulty doing errands alone such as visiting a doctor's office or shopping?: No    Assessment & Plan  1. Hospital discharge follow-up   2. Claudication (HCC)  - traMADol (ULTRAM) 50 MG tablet; Take 1 tablet (50 mg total) by mouth every 8 (eight) hours as needed for up to 5 days.  Dispense: 60 tablet; Refill: 0 - cilostazol (PLETAL) 50 MG tablet; Take 1 tablet  (50 mg total) by mouth 2 (two) times daily.  Dispense: 60 tablet; Refill: 0 - Ambulatory referral to Vascular Surgery  3. Encounter for chronic pain management  - traMADol (ULTRAM) 50 MG tablet; Take 1 tablet (50 mg total) by mouth every 8 (eight) hours as needed for up to 5 days.  Dispense: 60 tablet; Refill: 0  4. Cancer-related pain  - traMADol (ULTRAM) 50 MG tablet; Take 1 tablet (50 mg total) by mouth every 8 (eight) hours as needed for up to 5 days.  Dispense: 60 tablet; Refill: 0  5. Pain and swelling of left lower leg  - cilostazol (PLETAL) 50 MG tablet; Take 1 tablet (50 mg total) by mouth 2 (two) times daily.  Dispense: 60 tablet; Refill: 0 - Ambulatory referral to Vascular Surgery  6. PAD (peripheral artery disease) (HCC)  - cilostazol (PLETAL) 50 MG tablet; Take 1 tablet (50 mg total) by mouth 2 (two) times daily.  Dispense: 60 tablet; Refill: 0

## 2020-11-22 NOTE — Telephone Encounter (Signed)
Would like Tramadol. Do he need to have appointment or just come by to sign paper

## 2020-11-22 NOTE — Telephone Encounter (Signed)
Patient wife Kevin Donovan) stated that they will continue Meloxicam until Monday. If his pain is worst will call at that time for pain med.

## 2020-11-22 NOTE — Telephone Encounter (Signed)
Pts wife called asking to speak directly with Bonnita Nasuti, she stated the pain is not getting better, they would like to start a pain patch.

## 2020-11-23 ENCOUNTER — Other Ambulatory Visit: Payer: Self-pay | Admitting: Family Medicine

## 2020-11-23 ENCOUNTER — Telehealth: Payer: Self-pay

## 2020-11-23 MED ORDER — BACLOFEN 10 MG TABLET
0 days
Start: 2020-11-23 — End: ?

## 2020-11-23 MED ORDER — BACLOFEN 10 MG PO TABS: 10.0000 mg | ORAL_TABLET | Freq: Every evening | ORAL | 0 refills | Status: DC

## 2020-11-23 NOTE — Telephone Encounter (Signed)
Patient. wife notified. She stated that hey put him on a nitropaste patch for 5 days and was wondering if this could be causing his pain. Per Dr. Ancil Boozer patient may continue patch for remainder of 5 days and she can referred to Vascular Surgery if Avyaan would go. He stated he has appointment on 3/3 and will discuss at that time

## 2020-11-23 NOTE — Telephone Encounter (Signed)
Pt is having a lot of cramping in his feet that's going up his leg. Is there anything that can be done. Pt does have an appt for Monday

## 2020-11-26 ENCOUNTER — Ambulatory Visit (INDEPENDENT_AMBULATORY_CARE_PROVIDER_SITE_OTHER): Payer: Medicare HMO | Admitting: Family Medicine

## 2020-11-26 ENCOUNTER — Encounter: Payer: Self-pay | Admitting: Family Medicine

## 2020-11-26 ENCOUNTER — Other Ambulatory Visit: Payer: Self-pay

## 2020-11-26 VITALS — BP 136/78 | HR 97 | Temp 98.1°F | Resp 16 | Ht 70.0 in | Wt 167.0 lb

## 2020-11-26 DIAGNOSIS — G893 Neoplasm related pain (acute) (chronic): Secondary | ICD-10-CM | POA: Diagnosis not present

## 2020-11-26 DIAGNOSIS — I739 Peripheral vascular disease, unspecified: Secondary | ICD-10-CM | POA: Diagnosis not present

## 2020-11-26 DIAGNOSIS — Z09 Encounter for follow-up examination after completed treatment for conditions other than malignant neoplasm: Secondary | ICD-10-CM

## 2020-11-26 DIAGNOSIS — G8929 Other chronic pain: Secondary | ICD-10-CM | POA: Diagnosis not present

## 2020-11-26 DIAGNOSIS — M7989 Other specified soft tissue disorders: Secondary | ICD-10-CM

## 2020-11-26 DIAGNOSIS — M79662 Pain in left lower leg: Secondary | ICD-10-CM

## 2020-11-26 MED ORDER — TRAMADOL HCL 50 MG PO TABS: 50.0000 mg | ORAL_TABLET | Freq: Three times a day (TID) | ORAL | 0 refills | Status: AC | PRN

## 2020-11-26 MED ORDER — CILOSTAZOL 50 MG PO TABS: 50.0000 mg | ORAL_TABLET | Freq: Two times a day (BID) | ORAL | 0 refills | Status: DC

## 2020-11-29 DIAGNOSIS — I739 Peripheral vascular disease, unspecified: Principal | ICD-10-CM

## 2020-12-03 ENCOUNTER — Telehealth: Payer: Self-pay

## 2020-12-03 ENCOUNTER — Telehealth: Payer: Self-pay | Admitting: Emergency Medicine

## 2020-12-03 DIAGNOSIS — G893 Neoplasm related pain (acute) (chronic): Secondary | ICD-10-CM

## 2020-12-03 DIAGNOSIS — L089 Local infection of the skin and subcutaneous tissue, unspecified: Secondary | ICD-10-CM

## 2020-12-03 DIAGNOSIS — I739 Peripheral vascular disease, unspecified: Secondary | ICD-10-CM

## 2020-12-03 NOTE — Telephone Encounter (Signed)
Spoke to wife this morning. Tin foot is worse, she stated it smells like infection in in. Looks like it is filled with pus. Foot really stinks  She stated he need something for nausea. Will take a couple of bites of food then vomit.

## 2020-12-03 NOTE — Telephone Encounter (Signed)
Referral placed.

## 2020-12-04 ENCOUNTER — Telehealth: Payer: Self-pay | Admitting: Emergency Medicine

## 2020-12-04 ENCOUNTER — Telehealth: Payer: Self-pay | Admitting: Family Medicine

## 2020-12-04 ENCOUNTER — Other Ambulatory Visit: Payer: Self-pay | Admitting: Emergency Medicine

## 2020-12-04 MED ORDER — ONDANSETRON 4 MG PO TBDP: 4.0000 mg | ORAL_TABLET | Freq: Three times a day (TID) | ORAL | 0 refills | Status: DC | PRN

## 2020-12-04 NOTE — Telephone Encounter (Signed)
Spoke to patient wife as you suggested regarding a Hospice referral. Madelynn Done (wife) stated that they did not want a Hospice referral at this time. They are raising there grandson who is autistic and with other issues and they would like to talk to his counselor first to see how they should handle this transition.[name] stated she will call back in few days after wound care appointment and after grandson see a counselor

## 2020-12-04 NOTE — Telephone Encounter (Signed)
lvm to return call. Dr Ancil Boozer also spoke with the pharmacist

## 2020-12-04 NOTE — Telephone Encounter (Signed)
Please advise 

## 2020-12-04 NOTE — Telephone Encounter (Signed)
Sam from [place] states patient is currently on apixaban (ELIQUIS) 5 MG TABS tablet and PCP sent over cilostazol (PLETAL) 50 MG tablet therefore patient can not take both medications and would like a follow up call best [phone]

## 2020-12-05 ENCOUNTER — Other Ambulatory Visit: Payer: Self-pay

## 2020-12-05 ENCOUNTER — Encounter: Payer: Medicare HMO | Attending: Internal Medicine | Admitting: Internal Medicine

## 2020-12-05 DIAGNOSIS — C787 Secondary malignant neoplasm of liver and intrahepatic bile duct: Secondary | ICD-10-CM | POA: Insufficient documentation

## 2020-12-05 DIAGNOSIS — N183 Chronic kidney disease, stage 3 unspecified: Secondary | ICD-10-CM | POA: Insufficient documentation

## 2020-12-05 DIAGNOSIS — F17200 Nicotine dependence, unspecified, uncomplicated: Secondary | ICD-10-CM | POA: Diagnosis not present

## 2020-12-05 DIAGNOSIS — L97528 Non-pressure chronic ulcer of other part of left foot with other specified severity: Secondary | ICD-10-CM | POA: Insufficient documentation

## 2020-12-05 DIAGNOSIS — Z86711 Personal history of pulmonary embolism: Secondary | ICD-10-CM | POA: Insufficient documentation

## 2020-12-05 DIAGNOSIS — C61 Malignant neoplasm of prostate: Secondary | ICD-10-CM | POA: Diagnosis not present

## 2020-12-05 DIAGNOSIS — C779 Secondary and unspecified malignant neoplasm of lymph node, unspecified: Secondary | ICD-10-CM | POA: Insufficient documentation

## 2020-12-05 DIAGNOSIS — I96 Gangrene, not elsewhere classified: Secondary | ICD-10-CM | POA: Insufficient documentation

## 2020-12-05 DIAGNOSIS — Z7901 Long term (current) use of anticoagulants: Secondary | ICD-10-CM | POA: Diagnosis not present

## 2020-12-05 NOTE — Telephone Encounter (Signed)
Patient notified

## 2020-12-05 NOTE — Progress Notes (Signed)
Kevin, Donovan ([id number]) Visit Report for 12/05/2020 Abuse/Suicide Risk Screen Details Patient Name: Kevin Donovan, Kevin Donovan. Date of Service: 12/05/2020 2:45 PM Medical Record Number: [id number] Patient Account Number: [id number] Date of Birth/Sex: 1935/11/12 (85 y.o. M) Treating RN: Dolan Amen Primary Care Caedyn Tassinari: Steele Sizer Other Clinician: Jeanine Luz Referring Yeraldin Litzenberger: Steele Sizer Treating Damain Broadus/Extender: Tito Dine in Treatment: 0 Abuse/Suicide Risk Screen Items Answer ABUSE RISK SCREEN: Has anyone close to you tried to hurt or harm you recentlyo No Do you feel uncomfortable with anyone in your familyo No Has anyone forced you do things that you didnot want to doo No Electronic Signature(s) Signed: 12/05/2020 5:04:06 PM By: Georges Mouse, Minus Breeding RN Entered By: Georges Mouse, Kenia on 12/05/2020 14:56:52 Kevin Donovan ([id number]) -------------------------------------------------------------------------------- Activities of Daily Living Details Patient Name: Kevin, MCNIEL B. Date of Service: 12/05/2020 2:45 PM Medical Record Number: [id number] Patient Account Number: [id number] Date of Birth/Sex: [birth year] (85 y.o. M) Treating RN: Dolan Amen Primary Care Braydon Kullman: Steele Sizer Other Clinician: Jeanine Luz Referring Joeli Fenner: Steele Sizer Treating Huber Mathers/Extender: Tito Dine in Treatment: 0 Activities of Daily Living Items Answer Activities of Daily Living (Please select one for each item) Drive Automobile Completely Able Take Medications Completely Able Use Telephone Completely Able Care for Appearance Completely Able Use Toilet Completely Able Bath / Shower Completely Able Dress Self Completely Able Feed Self Completely Able Walk Completely Able Get In / Out Bed Completely Able Housework Completely Able Prepare Meals Completely Lilly for Self Completely  Able Electronic Signature(s) Signed: 12/05/2020 5:04:06 PM By: Georges Mouse, Minus Breeding RN Entered By: Georges Mouse, Minus Breeding on 12/05/2020 14:57:09 Kevin Donovan ([id number]) -------------------------------------------------------------------------------- Education Screening Details Patient Name: Kevin Donovan Date of Service: 12/05/2020 2:45 PM Medical Record Number: [id number] Patient Account Number: [id number] Date of Birth/Sex: Feb 05, 1936 (85 y.o. M) Treating RN: Dolan Amen Primary Care Nevayah Faust: Steele Sizer Other Clinician: Jeanine Luz Referring Zoriah Pulice: Steele Sizer Treating Michalene Debruler/Extender: Tito Dine in Treatment: 0 Primary Learner Assessed: Patient Learning Preferences/Education Level/Primary Language Learning Preference: Explanation, Demonstration Highest Education Level: College or Above Preferred Language: English Cognitive Barrier Language Barrier: No Translator Needed: No Memory Deficit: No Emotional Barrier: No Cultural/Religious Beliefs Affecting Medical Care: No Physical Barrier Impaired Vision: No Impaired Hearing: No Decreased Hand dexterity: No Knowledge/Comprehension Knowledge Level: Medium Comprehension Level: Medium Ability to understand written instructions: Medium Ability to understand verbal instructions: Medium Motivation Anxiety Level: Calm Cooperation: Cooperative Education Importance: Acknowledges Need Interest in Health Problems: Asks Questions Perception: Coherent Willingness to Engage in Self-Management Medium Activities: Readiness to Engage in Self-Management Medium Activities: Electronic Signature(s) Signed: 12/05/2020 5:04:06 PM By: Georges Mouse, Minus Breeding RN Entered By: Georges Mouse, Minus Breeding on 12/05/2020 14:57:35 Kevin Donovan ([id number]) -------------------------------------------------------------------------------- Fall Risk Assessment Details Patient Name: Kevin Donovan Date of Service: 12/05/2020 2:45 PM Medical Record Number: [id number] Patient Account Number: [id number] Date of Birth/Sex: [birth year] (85 y.o. M) Treating RN: Dolan Amen Primary Care Briante Loveall: Steele Sizer Other Clinician: Jeanine Luz Referring Kris No: Steele Sizer Treating Darlette Dubow/Extender: Tito Dine in Treatment: 0 Fall Risk Assessment Items Have you had 2 or more falls in the last 12 monthso 0 Yes Have you had any fall that resulted in injury in the last 12 monthso 0 No FALLS RISK SCREEN History of falling - immediate or within 3 months 0 No Secondary diagnosis (Do you have 2 or more medical diagnoseso) 15 Yes Ambulatory aid None/bed rest/wheelchair/nurse 0 Yes Crutches/cane/walker 0 No Furniture  0 No Intravenous therapy Access/Saline/Heparin Lock 0 No Gait/Transferring Normal/ bed rest/ wheelchair 0 Yes Weak (short steps with or without shuffle, stooped but able to lift head while walking, may 0 No seek support from furniture) Impaired (short steps with shuffle, may have difficulty arising from chair, head down, impaired 0 No balance) Mental Status Oriented to own ability 0 Yes Electronic Signature(s) Signed: 12/05/2020 5:04:06 PM By: Georges Mouse, Minus Breeding RN Entered By: Georges Mouse, Kenia on 12/05/2020 14:58:20 Kevin Donovan ([id number]) -------------------------------------------------------------------------------- Foot Assessment Details Patient Name: Kevin Donovan. Date of Service: 12/05/2020 2:45 PM Medical Record Number: [id number] Patient Account Number: [id number] Date of Birth/Sex: 1936-08-30 (85 y.o. M) Treating RN: Dolan Amen Primary Care Taygen Newsome: Steele Sizer Other Clinician: Jeanine Luz Referring Roshon Duell: Steele Sizer Treating Mario Voong/Extender: Tito Dine in Treatment: 0 Foot Assessment Items Site Locations + = Sensation present, - = Sensation absent, C = Callus, U = Ulcer R =  Redness, W = Warmth, M = Maceration, PU = Pre-ulcerative lesion F = Fissure, S = Swelling, D = Dryness Assessment Right: Left: Other Deformity: No No Prior Foot Ulcer: No No Prior Amputation: No No Charcot Joint: No No Ambulatory Status: Ambulatory Without Help Gait: Steady Electronic Signature(s) Signed: 12/05/2020 5:04:06 PM By: Georges Mouse, Minus Breeding RN Entered By: Georges Mouse, Minus Breeding on 12/05/2020 15:01:31 Kevin Donovan ([id number]) -------------------------------------------------------------------------------- Nutrition Risk Screening Details Patient Name: Kevin Donovan Date of Service: 12/05/2020 2:45 PM Medical Record Number: [id number] Patient Account Number: [id number] Date of Birth/Sex: [birth year] (85 y.o. M) Treating RN: Dolan Amen Primary Care Marie Borowski: Steele Sizer Other Clinician: Jeanine Luz Referring Franki Alcaide: Steele Sizer Treating Aaliayah Miao/Extender: Tito Dine in Treatment: 0 Height (in): 70 Weight (lbs): 165 Body Mass Index (BMI): 23.7 Nutrition Risk Screening Items Score Screening NUTRITION RISK SCREEN: I have an illness or condition that made me change the kind and/or amount of food I eat 0 No I eat fewer than two meals per day 3 Yes I eat few fruits and vegetables, or milk products 2 Yes I have three or more drinks of beer, liquor or wine almost every day 0 No I have tooth or mouth problems that make it hard for me to eat 0 No I don't always have enough money to buy the food I need 0 No I eat alone most of the time 0 No I take three or more different prescribed or over-the-counter drugs a day 1 Yes Without wanting to, I have lost or gained 10 pounds in the last six months 0 No I am not always physically able to shop, cook and/or feed myself 0 No Nutrition Protocols Good Risk Protocol Moderate Risk Protocol High Risk Proctocol 0 Provide education on nutrition Risk Level: High Risk Score: 6 Electronic  Signature(s) Signed: 12/05/2020 5:04:06 PM By: Georges Mouse, Minus Breeding RN Entered By: Georges Mouse, Minus Breeding on 12/05/2020 14:59:35

## 2020-12-06 ENCOUNTER — Other Ambulatory Visit: Payer: Self-pay | Admitting: Family Medicine

## 2020-12-06 ENCOUNTER — Telehealth: Payer: Self-pay | Admitting: Emergency Medicine

## 2020-12-06 DIAGNOSIS — I739 Peripheral vascular disease, unspecified: Secondary | ICD-10-CM

## 2020-12-06 DIAGNOSIS — L97512 Non-pressure chronic ulcer of other part of right foot with fat layer exposed: Secondary | ICD-10-CM

## 2020-12-06 MED ORDER — OXYCODONE 5 MG TABLET
Freq: Four times a day (QID) | ORAL | 0 days | PRN
Start: 2020-12-06 — End: ?

## 2020-12-06 MED ORDER — OXYCODONE HCL 5 MG PO TABS: 5.0000 mg | ORAL_TABLET | Freq: Four times a day (QID) | ORAL | 0 refills | Status: DC | PRN

## 2020-12-06 NOTE — Telephone Encounter (Signed)
[place] wife [facility] called and stated they saw wound care on yesterday and was told that gangrene has set in at bottom of feet and that he may loose part of his foot at some point. Weston has no feelings in his toes and in a lot of pain. Can something else be called in other than using Tramadol. Will go back to wound care on 3/23

## 2020-12-06 NOTE — Progress Notes (Signed)
Kevin Donovan ([id number]) Visit Report for 12/05/2020 Allergy List Details Patient Name: Kevin Donovan, Kevin Donovan. Date of Service: 12/05/2020 2:45 PM Medical Record Number: [id number] Patient Account Number: [id number] Date of Birth/Sex: July 05, 1936 (85 y.o. M) Treating Donovan: Kevin Donovan Primary Care Kevin Donovan: Kevin Donovan Other Clinician: Jeanine Donovan Referring Kevin Donovan: Kevin Donovan Treating Kevin Donovan/Extender: Kevin Donovan Kevin Donovan: 0 Allergies Active Allergies Valium Allergy Notes Electronic Signature(s) Signed: 12/05/2020 5:04:06 PM By: Kevin Donovan Entered By: Kevin Mouse, Minus Breeding on 12/05/2020 14:53:14 Kevin Donovan ([id number]) -------------------------------------------------------------------------------- Arrival Information Details Patient Name: Kevin Donovan Date of Service: 12/05/2020 2:45 PM Medical Record Number: [id number] Patient Account Number: [id number] Date of Birth/Sex: [birth year] (85 y.o. M) Treating Donovan: Kevin Donovan Primary Care Hamlin Devine: Kevin Donovan Other Clinician: Jeanine Donovan Referring Keshauna Degraffenreid: Kevin Donovan Treating Kevin Donovan/Extender: Kevin Donovan in Donovan: 0 Visit Information Patient Arrived: Ambulatory Arrival Time: 14:51 Accompanied By: wife Transfer Assistance: None Patient Identification Verified: Yes Secondary Verification Process Completed: Yes Patient Has Alerts: Yes Patient Alerts: Patient on Blood Thinner Kevin Donovan** Electronic Signature(s) Signed: 12/05/2020 5:04:06 PM By: Kevin Donovan Entered By: Kevin Mouse, Minus Breeding on 12/05/2020 14:51:59 Kevin Donovan ([id number]) -------------------------------------------------------------------------------- Clinic Level of Care Assessment Details Patient Name: Kevin Donovan Date of Service: 12/05/2020 2:45 PM Medical Record Number: [id number] Patient Account Number: [id number] Date of Birth/Sex:  1936-04-14 (85 y.o. M) Treating Donovan: Kevin Donovan Primary Care Kevin Donovan: Kevin Donovan Other Clinician: Jeanine Donovan Referring Kevin Donovan: Kevin Donovan Treating Kevin Donovan/Extender: Kevin Donovan in Donovan: 0 Clinic Level of Care Assessment Items TOOL 2 Quantity Score _0  - Use when only an EandM is performed on the INITIAL visit 0 ASSESSMENTS - Nursing Assessment / Reassessment X - General Physical Exam (combine w/ comprehensive assessment (listed just below) when performed on new 1 20 pt. evals) X- 1 25 Comprehensive Assessment (HX, ROS, Risk Assessments, Wounds Hx, etc.) ASSESSMENTS - Wound and Skin Assessment / Reassessment _1  - Simple Wound Assessment / Reassessment - one wound 0 X- 2 5 Complex Wound Assessment / Reassessment - multiple wounds _2  - 0 Dermatologic / Skin Assessment (not related to wound area) ASSESSMENTS - Ostomy and/or Continence Assessment and Care _3  - Incontinence Assessment and Management 0 _4  - 0 Ostomy Care Assessment and Management (repouching, etc.) PROCESS - Coordination of Care _5  - Simple Patient / Family Education for ongoing care 0 X- 1 20 Complex (extensive) Patient / Family Education for ongoing care X- 1 10 Staff obtains Programmer, systems, Records, Test Results / Process Orders _6  - 0 Staff telephones HHA, Nursing Homes / Clarify orders / etc _7  - 0 Routine Transfer to another Facility (non-emergent condition) _8  - 0 Routine Hospital Admission (non-emergent condition) X- 1 15 New Admissions / Biomedical engineer / Ordering NPWT, Apligraf, etc. _9  - 0 Emergency Hospital Admission (emergent condition) X- 1 10 Simple Discharge Coordination _10  - 0 Complex (extensive) Discharge Coordination PROCESS - Special Needs _11  - Pediatric / Minor Patient Management 0 _12  - 0 Isolation Patient Management _13  - 0 Hearing / Language / Visual special needs _14  - 0 Assessment of Community assistance (transportation, D/C planning, etc.) _15  -  0 Additional assistance / Altered mentation _16  - 0 Support Surface(s) Assessment (bed, cushion, seat, etc.) INTERVENTIONS - Wound Cleansing / Measurement X - Wound Imaging (photographs - any number of wounds) 1 5 _17  - 0 Wound Tracing (instead of photographs) _18  - 0 Simple Wound Measurement - one wound X- 2 5 Complex Wound Measurement - multiple  wounds [name], [name] ([id number]) _0  - 0 Simple Wound Cleansing - one wound X- 2 5 Complex Wound Cleansing - multiple wounds INTERVENTIONS - Wound Dressings _1  - Small Wound Dressing one or multiple wounds 0 X- 1 15 Medium Wound Dressing one or multiple wounds _2  - 0 Large Wound Dressing one or multiple wounds <[id number]>_9<\/[id number]>_4  - 0 Application of Medications - injection INTERVENTIONS - Miscellaneous _4  - External ear exam 0 _5  - 0 Specimen Collection (cultures, biopsies, blood, body fluids, etc.) _6  - 0 Specimen(s) / Culture(s) sent or taken to Lab for analysis _7  - 0 Patient Transfer (multiple staff / Civil Service fast streamer / Similar devices) _8  - 0 Simple Staple / Suture removal (25 or less) _9  - 0 Complex Staple / Suture removal (26 or more) _10  - 0 Hypo / Hyperglycemic Management (close monitor of Blood Glucose) _11  - 0 Ankle / Brachial Index (ABI) - do not check if billed separately Has the patient been seen at the hospital within the last three years: Yes Total Score: 150 Level Of Care: New/Established - Level 4 Electronic Signature(s) Signed: 12/05/2020 6:32:03 PM By: Gretta Cool, BSN, Donovan, CWS, Kim Donovan, BSN Entered By: Gretta Cool, BSN, Donovan, CWS, Kim on 12/05/2020 15:43:25 Kevin Donovan ([id number]) -------------------------------------------------------------------------------- Encounter Discharge Information Details Patient Name: Kevin Donovan. Date of Service: 12/05/2020 2:45 PM Medical Record Number: [id number] Patient Account Number: [id number] Date of Birth/Sex: 1936-07-12 (85 y.o. M) Treating Donovan: Kevin Donovan Primary Care Heatherly Stenner: Kevin Donovan Other Clinician: Jeanine Donovan Referring Sima Lindenberger: Kevin Donovan Treating Barre Aydelott/Extender: Kevin Donovan in Donovan: 0 Encounter Discharge Information Items Discharge Condition: Stable Ambulatory Status: Ambulatory Discharge Destination: Hospital Telephoned: No Orders Sent: No Accompanied By: wife Schedule Follow-up Appointment: Yes Clinical Summary of Care: Electronic Signature(s) Signed: 12/05/2020 6:32:03 PM By: Gretta Cool, BSN, Donovan, CWS, Kim Donovan, BSN Entered By: Gretta Cool, BSN, Donovan, CWS, Kim on 12/05/2020 15:46:49 Kevin Donovan ([id number]) -------------------------------------------------------------------------------- Lower Extremity Assessment Details Patient Name: Kevin Donovan, CALIENDO. Date of Service: 12/05/2020 2:45 PM Medical Record Number: [id number] Patient Account Number: [id number] Date of Birth/Sex: 1936/04/18 (85 y.o. M) Treating Donovan: Kevin Donovan Primary Care Shaneca Orne: Kevin Donovan Other Clinician: Jeanine Donovan Referring Trevyn Lumpkin: Kevin Donovan Treating Ezella Kell/Extender: Kevin Donovan in Donovan: 0 Edema Assessment Assessed: [Left: Yes] [Right: No] Edema: [Left: Ye] [Right: s] Calf Left: Right: Point of Measurement: 31 cm From Medial Instep 32.5 cm Ankle Left: Right: Point of Measurement: 10 cm From Medial Instep 24.5 cm Vascular Assessment Pulses: Dorsalis Pedis Palpable: [Left:Yes] Electronic Signature(s) Signed: 12/05/2020 5:04:06 PM By: Kevin Donovan Entered By: Kevin Mouse, Minus Breeding on 12/05/2020 15:12:31 Kevin Donovan ([id number]) -------------------------------------------------------------------------------- Multi Wound Chart Details Patient Name: Kevin Donovan. Date of Service: 12/05/2020 2:45 PM Medical Record Number: [id number] Patient Account Number: [id number] Date of Birth/Sex: April 28, 1936 (85 y.o. M) Treating Donovan: Kevin Donovan Primary Care Rachel Rison: Kevin Donovan Other  Clinician: Jeanine Donovan Referring Kaneshia Cater: Kevin Donovan Treating Elison Worrel/Extender: Kevin Donovan in Donovan: 0 Vital Signs Height(in): 70 Pulse(bpm): 103 Weight(lbs): 165 Blood Pressure(mmHg): 128/66 Body Mass Index(BMI): 24 Temperature(F): 98.9 Respiratory Rate(breaths/min): 20 Photos: [N/A:N/A] Wound Location: Left Toe Great Left Foot N/A Wounding Event: Gradually Appeared Gradually Appeared N/A Primary Etiology: Arterial Insufficiency Ulcer Arterial Insufficiency Ulcer N/A Comorbid History: Hypertension, Osteoarthritis, Hypertension, Osteoarthritis, N/A Received Radiation Received Radiation Date Acquired: 11/19/2020 11/19/2020 N/A Kevin of Donovan: 0 0 N/A Wound Status: Open Open N/A Measurements L x W x D (cm) 1.5x1.8x0.1 7x2.5x0.1 N/A Area (cm) : 2.121 13.744 N/A Volume (  cm) : 0.212 1.374 N/A % Reduction in Area: 0.00% 0.00% N/A % Reduction in Volume: 0.00% 0.00% N/A Classification: Full Thickness Without Exposed Unclassifiable N/A Support Structures Exudate Amount: Medium Large N/A Exudate Type: Serous Purulent N/A Exudate Color: amber yellow, brown, green N/A Foul Odor After Cleansing: No Yes N/A Odor Anticipated Due to Product N/A No N/A Use: Granulation Amount: Large (67-100%) None Present (0%) N/A Granulation Quality: Red N/A N/A Necrotic Amount: None Present (0%) Large (67-100%) N/A Exposed Structures: Fat Layer (Subcutaneous Tissue): Fat Layer (Subcutaneous Tissue): N/A Yes Yes Fascia: No Fascia: No Tendon: No Tendon: No Muscle: No Muscle: No Joint: No Joint: No Bone: No Bone: No Epithelialization: None None N/A Debridement: Chemical/Enzymatic/Mechanical Chemical/Enzymatic/Mechanical N/A Pre-procedure Verification/Time 16:30 16:30 N/A Out Taken: Instrument: Other(saline and gauze) Other(saline and gauze) N/A Bleeding: Minimum Minimum N/A Hemostasis Achieved: Pressure Pressure N/A Debridement Donovan Procedure was  tolerated well Procedure was tolerated well N/A Response: Post Debridement 1.5x1.8x0.1 7x2.5x0.1 N/A Measurements L x W x D (cm) 0.212 1.374 N/A Kevin Donovan, Kevin Donovan ([id number]) Post Debridement Volume: (cm) Procedures Performed: Debridement Debridement N/A Donovan Notes Wound #1 (Toe Great) Wound Laterality: Left Cleanser Peri-Wound Care Topical Primary Dressing SILVERCEL Antimicrobial Alginate Dressing, 1x12 (in/in) Discharge Instruction: weave in between toes Secondary Dressing [place] Roll-Medium Discharge Instruction: Apply Conforming Stretch Guaze Bandage as directed Gauze Discharge Instruction: As directed: dry, moistened with saline or moistened with Dakins Solution Secured With 74M Medipore H Soft Cloth Surgical Tape, 2x2 (in/yd) Compression Wrap Compression Stockings Add-Ons Wound #2 (Foot) Wound Laterality: Left Cleanser Peri-Wound Care Topical Primary Dressing SILVERCEL Antimicrobial Alginate Dressing, 1x12 (in/in) Secondary Dressing Secured With Compression Wrap Compression Stockings Add-Ons Electronic Signature(s) Signed: 12/06/2020 2:54:56 PM By: Linton Ham MD Entered By: Linton Ham on 12/05/2020 17:43:06 Kevin Donovan ([id number]) -------------------------------------------------------------------------------- [place] Plan Details Patient Name: Kevin Donovan, Kevin B. Date of Service: 12/05/2020 2:45 PM Medical Record Number: [id number] Patient Account Number: [id number] Date of Birth/Sex: 1936-01-03 (85 y.o. M) Treating Donovan: Kevin Donovan Primary Care Rease Wence: Kevin Donovan Other Clinician: Jeanine Donovan Referring Kymberlee Viger: Kevin Donovan Treating Milliana Reddoch/Extender: Kevin Donovan in Donovan: 0 Active Inactive Necrotic Tissue Nursing Diagnoses: Impaired tissue integrity related to necrotic/devitalized tissue Knowledge deficit related to management of necrotic/devitalized  tissue Goals: Necrotic/devitalized tissue will be minimized in the wound bed Date Initiated: 12/05/2020 Target Resolution Date: 12/19/2020 Goal Status: Active Interventions: Assess patient pain level pre-, during and post procedure and prior to discharge Notes: Orientation to the Wound Care Program Nursing Diagnoses: Knowledge deficit related to the wound healing center program Goals: Patient/caregiver will verbalize understanding of the [place] Date Initiated: 12/05/2020 Target Resolution Date: 02/13/2021 Goal Status: Active Interventions: Provide education on orientation to the wound center Notes: Pain, Acute or Chronic Nursing Diagnoses: Pain Management - Non-cyclic Chronic Pain Goals: Patient will verbalize adequate pain control and receive pain control interventions during procedures as needed Date Initiated: 12/05/2020 Target Resolution Date: 12/19/2020 Goal Status: Active Patient/caregiver will verbalize adequate pain control between visits Date Initiated: 12/05/2020 Target Resolution Date: 12/19/2020 Goal Status: Active Patient/caregiver will verbalize comfort level met Date Initiated: 12/05/2020 Target Resolution Date: 12/19/2020 Goal Status: Active Interventions: Complete pain assessment as per visit requirements Provide education on pain management Donovan Activities: Administer pain control measures as ordered : 12/05/2020 Kevin Donovan ([id number]) Notes: Wound/Skin Impairment Nursing Diagnoses: Impaired tissue integrity Goals: Patient/caregiver will verbalize understanding of skin care regimen Date Initiated: 12/05/2020 Target Resolution Date: 12/19/2020 Goal Status: Active Ulcer/skin breakdown will have a  volume reduction of 30% by week 4 Date Initiated: 12/05/2020 Target Resolution Date: 12/19/2020 Goal Status: Active Interventions: Assess ulceration(s) every visit Donovan Activities: Topical wound management initiated :  12/05/2020 Notes: Electronic Signature(s) Signed: 12/05/2020 6:32:03 PM By: Gretta Cool, BSN, Donovan, CWS, Kim Donovan, BSN Entered By: Gretta Cool, BSN, Donovan, CWS, Kim on 12/05/2020 15:36:34 Kevin Donovan ([id number]) -------------------------------------------------------------------------------- Pain Assessment Details Patient Name: Kevin Donovan Date of Service: 12/05/2020 2:45 PM Medical Record Number: [id number] Patient Account Number: [id number] Date of Birth/Sex: [birth year] (85 y.o. M) Treating Donovan: Kevin Donovan Primary Care Jesica Goheen: Kevin Donovan Other Clinician: Jeanine Donovan Referring Yolandra Habig: Kevin Donovan Treating Marivel Mcclarty/Extender: Kevin Donovan in Donovan: 0 Active Problems Location of Pain Severity and Description of Pain Patient Has Paino Yes Site Locations Pain Location: Pain in Ulcers Rate the pain. Current Pain Level: 1 Pain Management and Medication Current Pain Management: Electronic Signature(s) Signed: 12/05/2020 5:04:06 PM By: Kevin Donovan Entered By: Kevin Mouse, Kenia on 12/05/2020 14:52:16 Kevin Donovan ([id number]) -------------------------------------------------------------------------------- Patient/Caregiver Education Details Patient Name: Kevin Donovan Date of Service: 12/05/2020 2:45 PM Medical Record Number: [id number] Patient Account Number: [id number] Date of Birth/Gender: 08-22-1936 (85 y.o. M) Treating Donovan: Kevin Donovan Primary Care Physician: Kevin Donovan Other Clinician: Jeanine Donovan Referring Physician: Steele Donovan Treating Physician/Extender: Kevin Donovan in Donovan: 0 Education Assessment Education Provided To: Patient Education Topics Provided Pain: Handouts: A Guide to Pain Control Methods: Demonstration, Explain/Verbal Responses: State content correctly Welcome To [facility]: Handouts: Welcome To [facility] [place] Methods: Demonstration,  Explain/Verbal Responses: State content correctly Electronic Signature(s) Signed: 12/05/2020 6:32:03 PM By: Gretta Cool, BSN, Donovan, CWS, Kim Donovan, BSN Entered By: Gretta Cool, BSN, Donovan, CWS, Kim on 12/05/2020 15:44:17 Kevin Donovan ([id number]) -------------------------------------------------------------------------------- Wound Assessment Details Patient Name: Kevin Donovan Date of Service: 12/05/2020 2:45 PM Medical Record Number: [id number] Patient Account Number: [id number] Date of Birth/Sex: Aug 04, 1936 (85 y.o. M) Treating Donovan: Kevin Donovan Primary Care Jehan Bonano: Kevin Donovan Other Clinician: Jeanine Donovan Referring Zylan Almquist: Kevin Donovan Treating Preslea Rhodus/Extender: Kevin Donovan in Donovan: 0 Wound Status Wound Number: 1 Primary Etiology: Arterial Insufficiency Ulcer Wound Location: Left Toe Great Wound Status: Open Wounding Event: Gradually Appeared Comorbid History: Hypertension, Osteoarthritis, Received Radiation Date Acquired: 11/19/2020 Kevin Of Donovan: 0 Clustered Wound: No Photos Wound Measurements Length: (cm) 1.5 Width: (cm) 1.8 Depth: (cm) 0.1 Area: (cm) 2.121 Volume: (cm) 0.212 % Reduction in Area: 0% % Reduction in Volume: 0% Epithelialization: None Tunneling: No Undermining: No Wound Description Classification: Full Thickness Without Exposed Support Structu Exudate Amount: Medium Exudate Type: Serous Exudate Color: amber res Foul Odor After Cleansing: No Slough/Fibrino No Wound Bed Granulation Amount: Large (67-100%) Exposed Structure Granulation Quality: Red Fascia Exposed: No Necrotic Amount: None Present (0%) Fat Layer (Subcutaneous Tissue) Exposed: Yes Tendon Exposed: No Muscle Exposed: No Joint Exposed: No Bone Exposed: No Donovan Notes Wound #1 (Toe Great) Wound Laterality: Left Cleanser Peri-Wound Care Topical Primary Dressing Kevin Donovan, Kevin Donovan ([id number]) SILVERCEL Antimicrobial Alginate Dressing, 1x12  (in/in) Discharge Instruction: weave in between toes Secondary Dressing [place] Roll-Medium Discharge Instruction: [place] as directed Gauze Discharge Instruction: As directed: dry, moistened with saline or moistened with Dakins Solution Secured With 89M Medipore H Soft Cloth Surgical Tape, 2x2 (in/yd) Compression Wrap Compression Stockings Add-Ons Electronic Signature(s) Signed: 12/05/2020 5:04:06 PM By: Kevin Donovan Entered By: Kevin Mouse, Minus Breeding on 12/05/2020 15:06:56 Kevin Donovan ([id number]) -------------------------------------------------------------------------------- Wound Assessment Details Patient Name: Kevin Matar  B. Date of Service: 12/05/2020 2:45 PM Medical Record Number: [id number] Patient Account Number: [id number] Date of Birth/Sex: 10-Sep-1936 (85 y.o. M) Treating Donovan: Kevin Donovan Primary Care Khamani Fairley: Kevin Donovan Other Clinician: Jeanine Donovan Referring Akyra Bouchie: Kevin Donovan Treating Gwendelyn Lanting/Extender: Kevin Donovan in Donovan: 0 Wound Status Wound Number: 2 Primary Etiology: Arterial Insufficiency Ulcer Wound Location: Left Foot Wound Status: Open Wounding Event: Gradually Appeared Comorbid History: Hypertension, Osteoarthritis, Received Radiation Date Acquired: 11/19/2020 Kevin Of Donovan: 0 Clustered Wound: No Photos Wound Measurements Length: (cm) 7 % Reduct Width: (cm) 2.5 % Reduct Depth: (cm) 0.1 Epitheli Area: (cm) 13.744 Tunneli Volume: (cm) 1.374 Undermi ion in Area: 0% ion in Volume: 0% alization: None ng: No ning: No Wound Description Classification: Unclassifiable Foul Odo Exudate Amount: Large Due to P Exudate Type: Purulent Slough/F Exudate Color: yellow, brown, green r After Cleansing: Yes roduct Use: No ibrino Yes Wound Bed Granulation Amount: None Present (0%) Exposed Structure Necrotic Amount: Large (67-100%) Fascia Exposed:  No Necrotic Quality: Adherent Slough Fat Layer (Subcutaneous Tissue) Exposed: Yes Tendon Exposed: No Muscle Exposed: No Joint Exposed: No Bone Exposed: No Donovan Notes Wound #2 (Foot) Wound Laterality: Left Cleanser Peri-Wound Care Topical Primary Dressing Kevin Donovan, Kevin Donovan ([id number]) SILVERCEL Antimicrobial Alginate Dressing, 1x12 (in/in) Secondary Dressing Secured With Compression Wrap Compression Stockings Add-Ons Electronic Signature(s) Signed: 12/05/2020 5:24:08 PM By: Gretta Cool, BSN, Donovan, CWS, Kim Donovan, BSN Signed: 12/06/2020 4:04:06 PM By: Charlett Nose Donovan Previous Signature: 12/05/2020 5:04:06 PM Version By: Kevin Donovan Entered By: Gretta Cool, BSN, Donovan, CWS, Kim on 12/05/2020 17:24:08 Kevin Donovan ([id number]) -------------------------------------------------------------------------------- Vitals Details Patient Name: Kevin Matar B. Date of Service: 12/05/2020 2:45 PM Medical Record Number: [id number] Patient Account Number: [id number] Date of Birth/Sex: 07-Oct-1935 (85 y.o. M) Treating Donovan: Kevin Donovan Primary Care Eldene Plocher: Kevin Donovan Other Clinician: Jeanine Donovan Referring Deetta Siegmann: Kevin Donovan Treating Colman Birdwell/Extender: Kevin Donovan in Donovan: 0 Vital Signs Time Taken: 02:52 Temperature (F): 98.9 Height (in): 70 Pulse (bpm): 103 Source: Stated Respiratory Rate (breaths/min): 20 Weight (lbs): 165 Blood Pressure (mmHg): 128/66 Source: Measured Reference Range: 80 - 120 mg / dl Body Mass Index (BMI): 23.7 Electronic Signature(s) Signed: 12/05/2020 5:04:06 PM By: Kevin Donovan Entered By: Kevin Mouse, Minus Breeding on 12/05/2020 14:52:50

## 2020-12-06 NOTE — Progress Notes (Signed)
Kevin Donovan, Kevin Donovan ([id number]) Visit Report for 12/05/2020 Chief Complaint Document Details Patient Name: Kevin Donovan, Kevin Donovan. Date of Service: 12/05/2020 2:45 PM Medical Record Number: [id number] Patient Account Number: [id number] Date of Birth/Sex: 1936-03-27 (85 y.o. M) Treating RN: Cornell Barman Primary Care Provider: Steele Sizer Other Clinician: Jeanine Luz Referring Provider: Steele Sizer Treating Provider/Extender: Tito Dine in Treatment: 0 Information Obtained from: Patient Chief Complaint 12/06/2019; patient is here for review of wounds on his left foot specifically his left forefoot predominantly toes Electronic Signature(s) Signed: 12/06/2020 2:54:56 PM By: Linton Ham Kevin Donovan Entered By: Linton Ham on 12/05/2020 17:43:33 Kevin Donovan ([id number]) -------------------------------------------------------------------------------- Debridement Details Patient Name: Kevin Donovan Date of Service: 12/05/2020 2:45 PM Medical Record Number: [id number] Patient Account Number: [id number] Date of Birth/Sex: Jul 02, 1936 (85 y.o. M) Treating RN: Cornell Barman Primary Care Provider: Steele Sizer Other Clinician: Jeanine Luz Referring Provider: Steele Sizer Treating Provider/Extender: Tito Dine in Treatment: 0 Debridement Performed for Wound #1 Left Toe Great Assessment: Performed By: Physician Ricard Dillon, Kevin Donovan Debridement Type: Chemical/Enzymatic/Mechanical Donovan Used: saline and gauze Severity of Tissue Pre Debridement: Fat layer exposed Level of Consciousness (Pre- Awake and Alert procedure): Pre-procedure Verification/Time Out Yes - 16:30 Taken: Instrument: Other : saline and gauze Bleeding: Minimum Hemostasis Achieved: Pressure Response to Treatment: Procedure was tolerated well Level of Consciousness (Post- Awake and Alert procedure): Post Debridement Measurements of Total Wound Length: (cm) 1.5 Width: (cm)  1.8 Depth: (cm) 0.1 Volume: (cm) 0.212 Character of Wound/Ulcer Post Debridement: Stable Severity of Tissue Post Debridement: Fat layer exposed Post Procedure Diagnosis Same as Pre-procedure Electronic Signature(s) Signed: 12/05/2020 5:22:41 PM By: Gretta Cool, BSN, RN, CWS, Kim RN, BSN Signed: 12/06/2020 2:54:56 PM By: Linton Ham Kevin Donovan Entered By: Gretta Cool, BSN, RN, CWS, Kim on 12/05/2020 17:22:41 Kevin Donovan ([id number]) -------------------------------------------------------------------------------- Debridement Details Patient Name: Kevin Donovan. Date of Service: 12/05/2020 2:45 PM Medical Record Number: [id number] Patient Account Number: [id number] Date of Birth/Sex: [birth year] (85 y.o. M) Treating RN: Cornell Barman Primary Care Provider: Steele Sizer Other Clinician: Jeanine Luz Referring Provider: Steele Sizer Treating Provider/Extender: Tito Dine in Treatment: 0 Debridement Performed for Wound #2 Left Foot Assessment: Performed By: Physician Ricard Dillon, Kevin Donovan Debridement Type: Chemical/Enzymatic/Mechanical Donovan Used: saline and gauze Severity of Tissue Pre Debridement: Fat layer exposed Level of Consciousness (Pre- Awake and Alert procedure): Pre-procedure Verification/Time Out Yes - 16:30 Taken: Instrument: Other : saline and gauze Bleeding: Minimum Hemostasis Achieved: Pressure Response to Treatment: Procedure was tolerated well Level of Consciousness (Post- Awake and Alert procedure): Post Debridement Measurements of Total Wound Length: (cm) 7 Width: (cm) 2.5 Depth: (cm) 0.1 Volume: (cm) 1.374 Character of Wound/Ulcer Post Debridement: Stable Severity of Tissue Post Debridement: Fat layer exposed Post Procedure Diagnosis Same as Pre-procedure Electronic Signature(s) Signed: 12/05/2020 5:23:07 PM By: Gretta Cool, BSN, RN, CWS, Kim RN, BSN Signed: 12/06/2020 2:54:56 PM By: Linton Ham Kevin Donovan Entered By: Gretta Cool, BSN, RN, CWS, Kim on  12/05/2020 17:23:07 Kevin Donovan ([id number]) -------------------------------------------------------------------------------- HPI Details Patient Name: Kevin Donovan Date of Service: 12/05/2020 2:45 PM Medical Record Number: [id number] Patient Account Number: [id number] Date of Birth/Sex: Feb 06, 1936 (85 y.o. M) Treating RN: Cornell Barman Primary Care Provider: Steele Sizer Other Clinician: Jeanine Luz Referring Provider: Steele Sizer Treating Provider/Extender: Tito Dine in Treatment: 0 History of Present Illness HPI Description: ADMISSION 12/05/2020. This is a medically complex 85 year old man who arrives in clinic today for review of wounds in his left forefoot predominantly involving his  toes. He is not a diabetic. His problem began late last month he said he was picking some skin off the dorsal aspect of his left first toe and then he developed rapidly progressive ischemic changes in the toes with pain. He was seen in the ER on 11/17/2019 with acute pain in the left foot. He was felt to have acute embolism or thrombosis he was heparinized spent several days in the ER but was ultimately discharged. On 2/28 8 his primary physician sent him back to the ER. He underwent a CTA which is recorded below from his kidneys through his toes. He was not found to have significant macrovascular disease that could be identified. He was not admitted to hospital. He underwent a slew of lab work that showed lung other things a positive ANA at 1-80, also a positive ANCA. HIV was negative. C3 and C4 were normal his PT was 17.9 although he is on Eliquis PTT was normal CRP was 32. He has a follow-up with vascular surgery at the beginning of April. His primary physician sent him over here urgently. According to the patient she was wondering about adding additional medications presumably antiplatelet medications. -Short segment focal occlusion of the distal left superficial femoral  artery. -Three vessel run off bilatearlly Nonvascular findings: -No significant change in hepatic and nodal metastases as described above. -Redemonstration ofomoderate left hydroureteronephrosis with delayed nephrogram to the level of the mid ureter where there is evidence of luminal enhancement. Narrative Performed by [facility] RAD EXAM: CTA aorta and iliofemoral runoff DATE: 11/19/2020 9:27 PM ACCESSION: [id number] UN DICTATED: 11/19/2020 10:18 PM INTERPRETATION LOCATION: [place] CLINICAL INDICATION: 85 years old Male with ABI w/ concern for arterial obstruction ; Claudication or leg ischemia o COMPARISON: CT abdomen and pelvis on 10/23/2020. TECHNIQUE: A spiral CTA scan was obtained with IV contrast from the top of the kidneys through the toes. oImages were reconstructed in the axial plane. oMultiplanar reformatted and MIP images were provided for further evaluation of the vessels. oFor selected cases, 3D volume rendered images are also provided. Findings: VASCULAR: Aorta: Moderate scattered atherosclerotic plaques throughout the abdominal aorta. Patent Celiac artery: Patent Superior mesenteric artery: Patent Inferior mesenteric artery: Ostial calcifications. Patent Renal arteries: Single bilateral renal arteries with ostial calcifications. Patent Right lower extremity: Common iliac artery: Mild scattered atherosclerotic plaques. Patent External iliac artery: Mild scattered atherosclerotic plaques. Patent Internal iliac artery: Moderate scattered atherosclerotic plaques. Patent Common femoral artery: Patent Profunda femoral artery: Patent Superficial femoral artery: Patent Popliteal artery: Few scattered atherosclerotic plaques. Patent Anterior tibial artery: Patent Peroneal tibial trunk: Patent Peroneal artery: Patent Posterior tibial artery: Patent Dorsalis pedis artery: Patent Kevin Donovan, Kevin B. ([id number]) Left lower extremity: Common iliac artery: Moderate scattered  atherosclerotic plaques. Patent External iliac artery: Mild scattered atherosclerotic plaques. Patent Internal iliac artery: Moderate scattered atherosclerotic plaques with near complete occlusion of a distal short segment (4:125). Common femoral artery: Patent Profunda femoral artery: Patent Superficial femoral artery: Focal occlusion distally (4:226) with prompt reconstitution. Popliteal artery: Patent Anterior tibial artery: Patent Peroneal tibial trunk: Patent Peroneal artery: Patent Posterior tibial artery: Patent Dorsalis pedis artery: Patent NONVASCULAR FINDINGS: LOWER THORAX: Partially imaged nodular groundglass opacities in the left upper and lower lobe. Bibasilar atelectasis. HEPATOBILIARY: Redemonstration of rim-enhancing lesions scattered throughout the liver. For reference: -Unchanged 3 cm hepatic segment IVb lesion (4:58). -Unchanged 2.6 cm hepatic segment V lesion (4:72). -Unchanged 2.1 cm hepatic segment VI/VII lesion (4:65). The gallbladder is present and otherwise unremarkable. No biliary dilatation. o SPLEEN: Unremarkable. PANCREAS:  Unremarkable. ADRENALS: Unchanged diffuse thickening of bilateral adrenal glands. KIDNEYS/URETERS:oUnchanged delayed left nephrogram with moderate left hydroureteronephrosis to the level of the mid ureter. Decreased enhancement is again noted at this location, similar to prior. Unchanged left periureteral and perinephric stranding. Subcentimeter hypoattenuating lesions scattered throughout bilateral kidneys, too small to characterize. Unchanged right upper pole hypoattenuating renal lesion. Unchanged punctate bilateral renal calculi. BLADDER: Unchanged calculus within the right posterior aspect of the bladder. Diffuse bladder wall thickening, nonspecific. PELVIC/REPRODUCTIVE ORGANS: Prostatomegaly with nodular abutment of the bladder. Bilateral hydroceles. GI TRACT: No dilated or thick walled loops of bowel. Colonic diverticulosis. Normal  appendix. PERITONEUM/RETROPERITONEUM AND MESENTERY: No free air or fluid. LYMPH NODES: Multiple lymph nodes are again seen. For reference: -Stable to slightly decreased size of left para-aortic lymph node now measuring 1.7 cm (4:93) previously 1.9 cm. -Unchanged 1.7 cm precaval node (4:97). -Unchanged 1.1 cm right common iliac chain node (4:107). -Unchanged 1 x 3.3 cm left external iliac nodal mass. -Unchanged 1.4 cm left inguinal node (4:168). BONES AND SOFT TISSUES: No acute osseous abnormality. No lytic or blastic osseous lesions. Degenerative changes of the spine. Subcutaneous nodules involving the anterior abdominal wall likely representing injection granulomas. Procedure Note Kevin Donovan, Kevin Donovan Formatting of this note might be different from the original. EXAM: CTA aorta and iliofemoral runoff DATE: 11/19/2020 9:27 PM ACCESSION: [id number] UN DICTATED: 11/19/2020 10:18 PM INTERPRETATION LOCATION: [place] CLINICAL INDICATION: 85 years old Male with ABI w/ concern for arterial obstruction ; Claudication or leg ischemia o COMPARISON: CT abdomen and pelvis on 10/23/2020. TECHNIQUE: A spiral CTA scan was obtained with IV contrast from the top of the kidneys through the toes. oImages were reconstructed in the axial plane. oMultiplanar reformatted and MIP images were provided for further evaluation of the vessels. oFor selected cases, 3D volume rendered images are also provided. Findings: VASCULAR: Aorta: Moderate scattered atherosclerotic plaques throughout the abdominal aorta. Patent Celiac artery: Patent Superior mesenteric artery: Patent Inferior mesenteric artery: Ostial calcifications. Patent Renal arteries: Single bilateral renal arteries with ostial calcifications. Patent Kevin Donovan, Kevin Donovan ([id number]) Right lower extremity: Common iliac artery: Mild scattered atherosclerotic plaques. Patent External iliac artery: Mild scattered atherosclerotic plaques.  Patent Internal iliac artery: Moderate scattered atherosclerotic plaques. Patent Common femoral artery: Patent Profunda femoral artery: Patent Superficial femoral artery: Patent Popliteal artery: Few scattered atherosclerotic plaques. Patent Anterior tibial artery: Patent Peroneal tibialotrunk: Patent Peroneal artery: Patent Posterior tibial artery: Patent Dorsalis pedis artery: Patent Left lower extremity: Common iliac artery: Moderate scattered atherosclerotic plaques. Patent External iliac artery: Mild scattered atherosclerotic plaques. Patent Internal iliac artery: Moderate scattered atherosclerotic plaques with near complete occlusion of a distal short segment (4:125). Common femoral artery: Patent Profunda femoral artery: Patent Superficial femoral artery: Focal occlusion distally (4:226) with prompt reconstitution. Popliteal artery: Patent Anterior tibial artery: Patent Peroneal tibial trunk: Patent Peroneal artery: Patent Posterior tibial artery: Patent Dorsalis pedis artery: Patent NONVASCULAR FINDINGS: LOWER THORAX: Partially imaged nodular groundglass opacities in the left upper and lower lobe. Bibasilar atelectasis. HEPATOBILIARY: Redemonstration of rim-enhancing lesions scattered throughout the liver. For reference: -Unchanged 3 cm hepaticosegment IVb lesion (4:58). -Unchanged 2.6 cm hepatic segment V lesion (4:72). -Unchanged 2.1 cm hepatic segment VI/VII lesion (4:65). The gallbladder is present and otherwise unremarkable. No biliary dilatation. o SPLEEN: Unremarkable. PANCREAS: Unremarkable. ADRENALS: Unchanged diffuse thickening of bilateral adrenal glands. KIDNEYS/URETERS: Unchanged delayed left nephrogram with moderate left hydroureteronephrosis to the level of the mid ureter. Decreased enhancement is again noted at thisolocation, similar to prior. Unchanged left  periureteral and perinephric stranding. Subcentimeter hypoattenuating lesions scattered throughout  bilateral kidneys, too small to characterize. Unchanged right upper pole hypoattenuating renal lesion. Unchanged punctate bilateral renal calculi. BLADDER: Unchanged calculus within the right posterior aspect of the bladder. Diffuse bladder wall thickening, nonspecific. PELVIC/REPRODUCTIVE ORGANS: Prostatomegaly with nodular abutment of the bladder. Bilateralohydroceles. GI TRACT: No dilated or thick walled loops of bowel. Colonic diverticulosis. Normal appendix. PERITONEUM/RETROPERITONEUM AND MESENTERY: No free air or fluid. LYMPH NODES: Multiple lymph nodes are again seen. For reference: -Stable tooslightly decreased size of left para-aortic lymph node now measuring 1.7 cm (4:93) previously 1.9 cm. -Unchanged 1.7 cm precaval node (4:97). -Unchanged 1.1 cm right common iliac chain node (4:107). -Unchanged 1 x 3.3 cm left external iliac nodal mass. -Unchanged 1.4 cm left inguinal node (4:168). BONES AND SOFT TISSUES: No acute osseous abnormality. No lytic or blastic osseous lesions. Degenerative changes of the spine. Subcutaneous nodules involving the anterior abdominal wall likely representing injection granulomas. IMPRESSION: Vascular findings: -Short segment focal occlusion of the distal left superficial femoral artery. -Three vessel run off bilatearlly Nonvascular findings: -No significant change in hepatic and nodal metastases as described above. -Redemonstration of moderate left hydroureteronephrosis with delayed nephrogram to the level of the mid ureter where there is evidence of luminal enhancement. Exam End: 11/19/20 o9:27 PM Kevin Donovan ([id number]) Past medical history includes stage III chronic renal failure, he had a pulmonary embolism in February, he has metastatic prostate cancer. He is not a diabetic but he is a smoker Engineer, maintenance) Signed: 12/06/2020 2:54:56 PM By: Linton Ham Kevin Donovan Entered By: Linton Ham on 12/05/2020 17:52:27 Kevin Donovan  ([id number]) -------------------------------------------------------------------------------- Physical Exam Details Patient Name: Kevin Donovan Date of Service: 12/05/2020 2:45 PM Medical Record Number: [id number] Patient Account Number: [id number] Date of Birth/Sex: Jun 06, 1936 (85 y.o. M) Treating RN: Cornell Barman Primary Care Provider: Steele Sizer Other Clinician: Jeanine Luz Referring Provider: Steele Sizer Treating Provider/Extender: Tito Dine in Treatment: 0 Constitutional Sitting or standing Blood Pressure is within target range for patient.. Pulse regular and within target range for patient.Marland Kitchen Respirations regular, non- labored and within target range.. Temperature is normal and within the target range for the patient.Marland Kitchen appears in no distress. Cardiovascular Heart rhythm and rate regular, without murmur or gallop.. Femoral arteries without bruits and pulses strong.. Pedal pulses palpable and strong bilaterally.. Gastrointestinal (GI) No palpable mass. No liver or spleen enlargement or tenderness.. Notes Wound exam; the patient has dry gangrene on the tips of several toes. He has wounds in the webspaces in several locations. It is very difficult to open these because of pain. On the bottom of his foot there are skin changes that look like a reniform or angulated purpura. He has areas on the dorsal right first toe which is the largest additional wound Electronic Signature(s) Signed: 12/06/2020 2:54:56 PM By: Linton Ham Kevin Donovan Entered By: Linton Ham on 12/05/2020 17:54:07 Kevin Donovan ([id number]) -------------------------------------------------------------------------------- Physician Orders Details Patient Name: Kevin Donovan Date of Service: 12/05/2020 2:45 PM Medical Record Number: [id number] Patient Account Number: [id number] Date of Birth/Sex: [birth year] (85 y.o. M) Treating RN: Cornell Barman Primary Care Provider: Steele Sizer Other Clinician: Jeanine Luz Referring Provider: Steele Sizer Treating Provider/Extender: Tito Dine in Treatment: 0 Verbal / Phone Orders: No Diagnosis Coding Follow-up Appointments o Return Appointment in 1 week. Bathing/ Shower/ Hygiene o May shower; gently cleanse wound with antibacterial soap, rinse and pat dry prior to dressing wounds Wound Treatment Wound #1 - Toe  Great Wound Laterality: Left Primary Dressing: SILVERCEL Antimicrobial Alginate Dressing, 1x12 (in/in) (DME) (Dispense As Written) 1 x Per Day/30 Days Discharge Instructions: weave in between toes Secondary Dressing: Conforming Guaze Roll-Medium (DME) (Generic) 1 x Per Day/30 Days Discharge Instructions: Apply Conforming Stretch Moca as directed Secondary Dressing: Gauze (DME) (Generic) 1 x Per Day/30 Days Discharge Instructions: As directed: dry, moistened with saline or moistened with Dakins Solution Secured With: 20M Medipore H Soft Cloth Surgical Tape, 2x2 (in/yd) (DME) (Generic) 1 x Per Day/30 Days Wound #2 - Foot Wound Laterality: Left Primary Dressing: SILVERCEL Antimicrobial Alginate Dressing, 1x12 (in/in) (DME) (Dispense As Written) 1 x Per Day/30 Days Discharge Instructions: weave in between toes Secondary Dressing: Conforming Guaze Roll-Medium (DME) (Generic) 1 x Per Day/30 Days Discharge Instructions: Apply Conforming Stretch Guaze Bandage as directed Secondary Dressing: Gauze (DME) (Generic) 1 x Per Day/30 Days Discharge Instructions: As directed: dry, moistened with saline or moistened with Dakins Solution Secured With: 20M Medipore H Soft Cloth Surgical Tape, 2x2 (in/yd) (DME) (Generic) 1 x Per Day/30 Days Electronic Signature(s) Signed: 12/05/2020 5:26:36 PM By: Gretta Cool, BSN, RN, CWS, Kim RN, BSN Signed: 12/06/2020 2:54:56 PM By: Linton Ham Kevin Donovan Previous Signature: 12/05/2020 5:25:12 PM Version By: Gretta Cool, BSN, RN, CWS, Kim RN, BSN Previous Signature: 12/05/2020  5:20:47 PM Version By: Gretta Cool, BSN, RN, CWS, Kim RN, BSN Entered By: Gretta Cool, BSN, RN, CWS, Kim on 12/05/2020 17:26:35 Kevin Donovan ([id number]) -------------------------------------------------------------------------------- Problem List Details Patient Name: Kevin Donovan, Kevin Donovan. Date of Service: 12/05/2020 2:45 PM Medical Record Number: [id number] Patient Account Number: [id number] Date of Birth/Sex: Mar 22, 1936 (85 y.o. M) Treating RN: Cornell Barman Primary Care Provider: Steele Sizer Other Clinician: Jeanine Luz Referring Provider: Steele Sizer Treating Provider/Extender: Tito Dine in Treatment: 0 Active Problems ICD-10 Encounter Code Description Active Date MDM Diagnosis L97.528 Non-pressure chronic ulcer of other part of left foot with other specified 12/05/2020 No Yes severity I96 Gangrene, not elsewhere classified 12/05/2020 No Yes Inactive Problems Resolved Problems Electronic Signature(s) Signed: 12/06/2020 2:54:56 PM By: Linton Ham Kevin Donovan Entered By: Linton Ham on 12/05/2020 17:42:47 Kevin Donovan ([id number]) -------------------------------------------------------------------------------- Progress Note Details Patient Name: Kevin Donovan Date of Service: 12/05/2020 2:45 PM Medical Record Number: [id number] Patient Account Number: [id number] Date of Birth/Sex: 1935/11/16 (85 y.o. M) Treating RN: Cornell Barman Primary Care Provider: Steele Sizer Other Clinician: Jeanine Luz Referring Provider: Steele Sizer Treating Provider/Extender: Tito Dine in Treatment: 0 Subjective Chief Complaint Information obtained from Patient 12/06/2019; patient is here for review of wounds on his left foot specifically his left forefoot predominantly toes History of Present Illness (HPI) ADMISSION 12/05/2020. This is a medically complex 85 year old man who arrives in clinic today for review of wounds in his left forefoot  predominantly involving his toes. He is not a diabetic. His problem began late last month he said he was picking some skin off the dorsal aspect of his left first toe and then he developed rapidly progressive ischemic changes in the toes with pain. He was seen in the ER on 11/17/2019 with acute pain in the left foot. He was felt to have acute embolism or thrombosis he was heparinized spent several days in the ER but was ultimately discharged. On 2/28 8 his primary physician sent him back to the ER. He underwent a CTA which is recorded below from his kidneys through his toes. He was not found to have significant macrovascular disease that could be identified. He was not admitted to hospital. He underwent a  slew of lab work that showed lung other things a positive ANA at 1-80, also a positive ANCA. HIV was negative. C3 and C4 were normal his PT was 17.9 although he is on Eliquis PTT was normal CRP was 32. He has a follow-up with vascular surgery at the beginning of April. His primary physician sent him over here urgently. According to the patient she was wondering about adding additional medications presumably antiplatelet medications. -Short segment focal occlusion of the distal left superficial femoral artery. -Three vessel run off bilatearlly Nonvascular findings: -No significant change in hepatic and nodal metastases as described above. -Redemonstration of moderate left hydroureteronephrosis with delayed nephrogram to the level of the mid ureter where there is evidence of luminal enhancement. Narrative Performed by [facility] RAD EXAM: CTA aorta and iliofemoral runoff DATE: 11/19/2020 9:27 PM ACCESSION: [id number] UN DICTATED: 11/19/2020 10:18 PM INTERPRETATION LOCATION: [place] CLINICAL INDICATION: 85 years old Male with ABI w/ concern for arterial obstruction ; Claudication or leg ischemia COMPARISON: CT abdomen and pelvis on 10/23/2020. TECHNIQUE: A spiral CTA scan was obtained with IV contrast  from the top of the kidneys through the toes. Images were reconstructed in the axial plane. Multiplanar reformatted and MIP images were provided for further evaluation of the vessels. For selected cases, 3D volume rendered images are also provided. Findings: VASCULAR: Aorta: Moderate scattered atherosclerotic plaques throughout the abdominal aorta. Patent Celiac artery: Patent Superior mesenteric artery: Patent Inferior mesenteric artery: Ostial calcifications. Patent Renal arteries: Single bilateral renal arteries with ostial calcifications. Patent Right lower extremity: Common iliac artery: Mild scattered atherosclerotic plaques. Patent External iliac artery: Mild scattered atherosclerotic plaques. Patent Internal iliac artery: Moderate scattered atherosclerotic plaques. Patent Common femoral artery: Patent Profunda femoral artery: Patent Superficial femoral artery: Patent Popliteal artery: Few scattered atherosclerotic plaques. Patent Anterior tibial artery: Patent Kevin Donovan, Kevin Donovan ([id number]) Peroneal tibial trunk: Patent Peroneal artery: Patent Posterior tibial artery: Patent Dorsalis pedis artery: Patent Left lower extremity: Common iliac artery: Moderate scattered atherosclerotic plaques. Patent External iliac artery: Mild scattered atherosclerotic plaques. Patent Internal iliac artery: Moderate scattered atherosclerotic plaques with near complete occlusion of a distal short segment (4:125). Common femoral artery: Patent Profunda femoral artery: Patent Superficial femoral artery: Focal occlusion distally (4:226) with prompt reconstitution. Popliteal artery: Patent Anterior tibial artery: Patent Peroneal tibial trunk: Patent Peroneal artery: Patent Posterior tibial artery: Patent Dorsalis pedis artery: Patent NONVASCULAR FINDINGS: LOWER THORAX: Partially imaged nodular groundglass opacities in the left upper and lower lobe. Bibasilar atelectasis. HEPATOBILIARY:  Redemonstration of rim-enhancing lesions scattered throughout the liver. For reference: -Unchanged 3 cm hepatic segment IVb lesion (4:58). -Unchanged 2.6 cm hepatic segment V lesion (4:72). -Unchanged 2.1 cm hepatic segment VI/VII lesion (4:65). The gallbladder is present and otherwise unremarkable. No biliary dilatation. SPLEEN: Unremarkable. PANCREAS: Unremarkable. ADRENALS: Unchanged diffuse thickening of bilateral adrenal glands. KIDNEYS/URETERS: Unchanged delayed left nephrogram with moderate left hydroureteronephrosis to the level of the mid ureter. Decreased enhancement is again noted at this location, similar to prior. Unchanged left periureteral and perinephric stranding. Subcentimeter hypoattenuating lesions scattered throughout bilateral kidneys, too small to characterize. Unchanged right upper pole hypoattenuating renal lesion. Unchanged punctate bilateral renal calculi. BLADDER: Unchanged calculus within the right posterior aspect of the bladder. Diffuse bladder wall thickening, nonspecific. PELVIC/REPRODUCTIVE ORGANS: Prostatomegaly with nodular abutment of the bladder. Bilateral hydroceles. GI TRACT: No dilated or thick walled loops of bowel. Colonic diverticulosis. Normal appendix. PERITONEUM/RETROPERITONEUM AND MESENTERY: No free air or fluid. LYMPH NODES: Multiple lymph nodes are again seen. For reference: -Stable to slightly  decreased size of left para-aortic lymph node now measuring 1.7 cm (4:93) previously 1.9 cm. -Unchanged 1.7 cm precaval node (4:97). -Unchanged 1.1 cm right common iliac chain node (4:107). -Unchanged 1 x 3.3 cm left external iliac nodal mass. -Unchanged 1.4 cm left inguinal node (4:168). BONES AND SOFT TISSUES: No acute osseous abnormality. No lytic or blastic osseous lesions. Degenerative changes of the spine. Subcutaneous nodules involving the anterior abdominal wall likely representing injection granulomas. Procedure Note Kevin Agent, Kevin Donovan -  Donovan Formatting of this note might be different from the original. EXAM: CTA aorta and iliofemoral runoff DATE: 11/19/2020 9:27 PM ACCESSION: [id number] UN DICTATED: 11/19/2020 10:18 PM INTERPRETATION LOCATION: [place] CLINICAL INDICATION: 85 years old Male with ABI w/ concern for arterial obstruction ; Claudication or leg ischemia COMPARISON: CT abdomen and pelvis on 10/23/2020. TECHNIQUE: A spiral CTA scan was obtained with IV contrast from the top of the kidneys through the toes. Images were reconstructed in the axial plane. Multiplanar reformatted and MIP images were provided for further evaluation of the vessels. For selected cases, 3D volume rendered images are also provided. Findings: VASCULAR: Aorta: Moderate scattered atherosclerotic plaques throughout the abdominal aorta. Patent Kevin Donovan, Kevin Donovan ([id number]) Celiac artery: Patent Superior mesenteric artery: Patent Inferior mesenteric artery: Ostial calcifications. Patent Renal arteries: Single bilateral renal arteries with ostial calcifications. Patent Right lower extremity: Common iliac artery: Mild scattered atherosclerotic plaques. Patent External iliac artery: Mild scattered atherosclerotic plaques. Patent Internal iliac artery: Moderate scattered atherosclerotic plaques. Patent Common femoral artery: Patent Profunda femoral artery: Patent Superficial femoral artery: Patent Popliteal artery: Few scattered atherosclerotic plaques. Patent Anterior tibial artery: Patent Peroneal tibial trunk: Patent Peroneal artery: Patent Posterior tibial artery: Patent Dorsalis pedis artery: Patent Left lower extremity: Common iliac artery: Moderate scattered atherosclerotic plaques. Patent External iliac artery: Mild scattered atherosclerotic plaques. Patent Internal iliac artery: Moderate scattered atherosclerotic plaques with near complete occlusion of a distal short segment (4:125). Common femoral artery: Patent Profunda  femoral artery: Patent Superficial femoral artery: Focal occlusion distally (4:226) with prompt reconstitution. Popliteal artery: Patent Anterior tibial artery: Patent Peroneal tibial trunk: Patent Peroneal artery: Patent Posterior tibial artery: Patent Dorsalis pedis artery: Patent NONVASCULAR FINDINGS: LOWER THORAX: Partially imaged nodular groundglass opacities in the left upper and lower lobe. Bibasilar atelectasis. HEPATOBILIARY: Redemonstration of rim-enhancing lesions scattered throughout the liver. For reference: -Unchanged 3 cm hepatic segment IVb lesion (4:58). -Unchanged 2.6 cm hepatic segment V lesion (4:72). -Unchanged 2.1 cm hepatic segment VI/VII lesion (4:65). The gallbladder is present and otherwise unremarkable. No biliary dilatation. SPLEEN: Unremarkable. PANCREAS: Unremarkable. ADRENALS: Unchanged diffuse thickening of bilateral adrenal glands. KIDNEYS/URETERS: Unchanged delayed left nephrogram with moderate left hydroureteronephrosis to the level of the mid ureter. Decreased enhancement is again noted at this location, similar to prior. Unchanged left periureteral and perinephric stranding. Subcentimeter hypoattenuating lesions scattered throughout bilateral kidneys, too small to characterize. Unchanged right upper pole hypoattenuating renal lesion. Unchanged punctate bilateral renal calculi. BLADDER: Unchanged calculus within the right posterior aspect of the bladder. Diffuse bladder wall thickening, nonspecific. PELVIC/REPRODUCTIVE ORGANS: Prostatomegaly with nodular abutment of the bladder. Bilateral hydroceles. GI TRACT: No dilated or thick walled loops of bowel. Colonic diverticulosis. Normal appendix. PERITONEUM/RETROPERITONEUM AND MESENTERY: No free air or fluid. LYMPH NODES: Multiple lymph nodes are again seen. For reference: -Stable to slightly decreased size of left para-aortic lymph node now measuring 1.7 cm (4:93) previously 1.9 cm. -Unchanged 1.7 cm  precaval node (4:97). -Unchanged 1.1 cm right common iliac chain node (4:107). -Unchanged 1 x 3.3 cm  left external iliac nodal mass. -Unchanged 1.4 cm left inguinal node (4:168). BONES AND SOFT TISSUES: No acute osseous abnormality. No lytic or blastic osseous lesions. Degenerative changes of the spine. Subcutaneous nodules involving the anterior abdominal wall likely representing injection granulomas. IMPRESSION: Vascular findings: -Short segment focal occlusion of the distal left superficial femoral artery. -Three vessel run off bilatearlly Nonvascular findings: -No significant change in hepatic and nodal metastases as described above. -Redemonstration of moderate left hydroureteronephrosis with delayed nephrogram to the level of the mid ureter where there is evidence of SEBASTIANO, LUECKE B. ([id number]) luminal enhancement. Exam End: 11/19/20 9:27 PM Past medical history includes stage III chronic renal failure, he had a pulmonary embolism in February, he has metastatic prostate cancer. He is not a diabetic but he is a smoker Patient History Information obtained from Patient. Allergies Valium Social History Current every day smoker, Marital Status - Married, Alcohol Use - Never, Drug Use - No History, Caffeine Use - Moderate. Medical History Eyes Denies history of Cataracts, Glaucoma, Optic Neuritis Ear/Nose/Mouth/Throat Denies history of Chronic sinus problems/congestion, Middle ear problems Respiratory Denies history of Aspiration, Asthma, Chronic Obstructive Pulmonary Disease (COPD), Pneumothorax, Sleep Apnea, Tuberculosis Cardiovascular Patient has history of Hypertension Denies history of Angina, Arrhythmia, Congestive Heart Failure, Coronary Artery Disease, Deep Vein Thrombosis, Hypotension, Myocardial Infarction, Peripheral Arterial Disease, Peripheral Venous Disease, Phlebitis, Vasculitis Gastrointestinal Denies history of Cirrhosis , Colitis, Crohn s, Hepatitis A,  Hepatitis B, Hepatitis C Endocrine Denies history of Type I Diabetes, Type II Diabetes Genitourinary Denies history of End Stage Renal Disease Immunological Denies history of Lupus Erythematosus, Raynaud s, Scleroderma Integumentary (Skin) Denies history of History of Burn, History of pressure wounds Musculoskeletal Patient has history of Osteoarthritis Denies history of Gout, Rheumatoid Arthritis, Osteomyelitis Neurologic Denies history of Dementia, Neuropathy, Quadriplegia, Paraplegia, Seizure Disorder Oncologic Patient has history of Received Radiation Denies history of Received Chemotherapy Psychiatric Denies history of Anorexia/bulimia, Confinement Anxiety Medical And Surgical History Notes Musculoskeletal osteoporosis Oncologic metastatic prostate cancer Review of Systems (ROS) Constitutional Symptoms (General Health) Denies complaints or symptoms of Fatigue, Fever, Chills, Marked Weight Change. Eyes Denies complaints or symptoms of Dry Eyes, Vision Changes, Glasses / Contacts. Ear/Nose/Mouth/Throat Denies complaints or symptoms of Difficult clearing ears, Sinusitis. Hematologic/Lymphatic Denies complaints or symptoms of Bleeding / Clotting Disorders, Human Immunodeficiency Virus. Respiratory Denies complaints or symptoms of Chronic or frequent coughs, Shortness of Breath. Cardiovascular Complains or has symptoms of LE edema. Denies complaints or symptoms of Chest pain. Gastrointestinal Denies complaints or symptoms of Frequent diarrhea, Nausea, Vomiting. Endocrine Complains or has symptoms of Thyroid disease. Denies complaints or symptoms of Hepatitis, Polydypsia (Excessive Thirst). Genitourinary Denies complaints or symptoms of Kidney failure/ Dialysis, Incontinence/dribbling. Immunological Denies complaints or symptoms of Hives, Itching. Integumentary (Skin) AERION, BAGDASARIAN B. ([id number]) Complains or has symptoms of Wounds, Swelling. Denies complaints or  symptoms of Bleeding or bruising tendency, Breakdown. Musculoskeletal Denies complaints or symptoms of Muscle Pain, Muscle Weakness. Neurologic Denies complaints or symptoms of Numbness/parasthesias, Focal/Weakness. Psychiatric Denies complaints or symptoms of Anxiety, Claustrophobia. Objective Constitutional Sitting or standing Blood Pressure is within target range for patient.. Pulse regular and within target range for patient.Marland Kitchen Respirations regular, non- labored and within target range.. Temperature is normal and within the target range for the patient.Marland Kitchen appears in no distress. Vitals Time Taken: 2:52 AM, Height: 70 in, Source: Stated, Weight: 165 lbs, Source: Measured, BMI: 23.7, Temperature: 98.9 F, Pulse: 103 bpm, Respiratory Rate: 20 breaths/min, Blood Pressure: 128/66 mmHg. Cardiovascular Heart rhythm and rate regular,  without murmur or gallop.. Femoral arteries without bruits and pulses strong.. Pedal pulses palpable and strong bilaterally.. Gastrointestinal (GI) No palpable mass. No liver or spleen enlargement or tenderness.. General Notes: Wound exam; the patient has dry gangrene on the tips of several toes. He has wounds in the webspaces in several locations. It is very difficult to open these because of pain. On the bottom of his foot there are skin changes that look like a reniform or angulated purpura. He has areas on the dorsal right first toe which is the largest additional wound Integumentary (Hair, Skin) Wound #1 status is Open. Original cause of wound was Gradually Appeared. The date acquired was: 11/19/2020. The wound is located on the Left Toe Great. The wound measures 1.5cm length x 1.8cm width x 0.1cm depth; 2.121cm^2 area and 0.212cm^3 volume. There is Fat Layer (Subcutaneous Tissue) exposed. There is no tunneling or undermining noted. There is a medium amount of serous drainage noted. There is large (67-100%) red granulation within the wound bed. There is no  necrotic tissue within the wound bed. Wound #2 status is Open. Original cause of wound was Gradually Appeared. The date acquired was: 11/19/2020. The wound is located on the Left Foot. The wound measures 7cm length x 2.5cm width x 0.1cm depth; 13.744cm^2 area and 1.374cm^3 volume. There is Fat Layer (Subcutaneous Tissue) exposed. There is no tunneling or undermining noted. There is a large amount of purulent drainage noted. Foul odor after cleansing was noted. There is no granulation within the wound bed. There is a large (67-100%) amount of necrotic tissue within the wound bed including Adherent Slough. 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 There was a Chemical/Enzymatic/Mechanical debridement performed by Ricard Dillon, Kevin Donovan. With the following instrument (s): saline and gauze. Other Donovan used was saline and gauze. A time out was conducted at 16:30, prior to the start of the procedure. A Minimum amount of bleeding was controlled with Pressure. The procedure was tolerated well. Post Debridement Measurements: 1.5cm length x 1.8cm width x 0.1cm depth; 0.212cm^3 volume. Kevin Donovan, Kevin Donovan ([id number]) Character of Wound/Ulcer Post Debridement is stable. Severity of Tissue Post Debridement is: Fat layer exposed. 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 There was a Chemical/Enzymatic/Mechanical debridement performed by Ricard Dillon, Kevin Donovan. With the following instrument(s): saline and gauze. Other Donovan used was saline and  gauze. A time out was conducted at 16:30, prior to the start of the procedure. A Minimum amount of bleeding was controlled with Pressure. The procedure was tolerated well. Post Debridement Measurements: 7cm length x 2.5cm width x 0.1cm depth; 1.374cm^3 volume. Character of Wound/Ulcer Post Debridement is stable. Severity of Tissue Post Debridement is: Fat layer exposed. Post procedure Diagnosis Wound #2: Same as Pre-Procedure Plan Follow-up Appointments: Return Appointment in 1 week. Bathing/ Shower/ Hygiene: May shower; gently cleanse wound with antibacterial soap, rinse and pat dry prior to dressing wounds WOUND #1: - Toe Great Wound Laterality: Left Primary Dressing: SILVERCEL Antimicrobial Alginate Dressing, 1x12 (in/in) (DME) (Dispense As Written) 1 x Per Day/30 Days Discharge Instructions: weave in between toes Secondary Dressing: Conforming  Guaze Roll-Medium (DME) (Generic) 1 x Per Day/30 Days Discharge Instructions: Apply Conforming Stretch Guaze Bandage as directed Secondary Dressing: Gauze (DME) (Generic) 1 x Per Day/30 Days Discharge Instructions: As directed: dry, moistened with saline or moistened with Dakins Solution Secured With: 68M Medipore H Soft Cloth Surgical Tape, 2x2 (in/yd) (DME) (Generic) 1 x Per Day/30 Days WOUND #2: - Foot Wound Laterality: Left Primary Dressing: SILVERCEL Antimicrobial Alginate Dressing, 1x12 (in/in) (DME) (Dispense As Written) 1 x Per Day/30 Days Discharge Instructions: weave in between toes Secondary Dressing: Conforming Guaze Roll-Medium (DME) (Generic) 1 x Per Day/30 Days Discharge Instructions: Apply Conforming Stretch Guaze Bandage as directed Secondary Dressing: Gauze (DME) (Generic) 1 x Per Day/30 Days Discharge Instructions: As directed: dry, moistened with saline or moistened with Dakins Solution Secured With: 68M Medipore H Soft Cloth Surgical Tape, 2x2 (in/yd) (DME) (Generic) 1 x Per Day/30 Days 1. This patient has forefoot ischemia  presumably microvascular at some level. The exact cause of this is unclear. His CT scan did not show macrovascular disease nor did it show at least to the level of the renal arteries a source of microvascular emboli. However this is still possible as it could have come above this level. I did not see a previous echocardiogram. Nor did I see any other evidence of emboli anywhere else however. 2. The other thoughts I had would include something related to lupus possibly antiphospholipid syndrome he does have a recent PE as well. 3. I did not see any other abnormalities at any other level of his skin including the right foot 4. The patient has metastatic prostate cancer I am not sure that I have ever heard prostate cancer being responsible for a hypercoagulable state or presenting like this. 5. We are going to treat this with silver alginate between the toes gauze being held in place by net. His wife is to change this every second day. 6. The patient is not in a lot of pain unless the areas are manipulated. I wondered whether this is going to come to progressive tissue loss that might end up in an amputation however optimistically he says that the ischemia had spread into his heel when this first happened and it is certainly resolved now. This is completely limited to the forefoot 7. I am not sure how to respond to the potential addition of antiplatelet drugs. Adding antiplatelet drugs on top of Eliquis would certainly add to a bleeding risk. I also had some thoughts about pentoxifylline 8. The patient is only on Trental which he took takes twice a day for pain and he seems to be managing on it. However if this deteriorates and the pain increases I certainly would have no qualms with addition of as needed narcotics. This would have to be very painful I spent 50 minutes in review of this patient's past medical history, face-to-face evaluation and preparation of this record Electronic  Signature(s) Signed: 12/06/2020 2:54:56 PM By: Linton Ham Kevin Donovan Entered By: Linton Ham on 12/05/2020 18:01:28 Kevin Donovan ([id number]) -------------------------------------------------------------------------------- ROS/PFSH Details Patient Name: Kevin Donovan Date of Service: 12/05/2020 2:45 PM Medical Record Number: [id number] Patient Account Number: [id number] Date of Birth/Sex: 11-24-1935 (85 y.o. M) Treating RN: Dolan Amen Primary Care Provider: Steele Sizer Other Clinician: Jeanine Luz Referring Provider: Steele Sizer Treating Provider/Extender: Tito Dine in Treatment: 0 Information Obtained From Patient Constitutional Symptoms (General Health) Complaints and Symptoms: Negative for: Fatigue; Fever; Chills; Marked Weight Change Eyes Complaints and Symptoms: Negative for: Dry Eyes;  Vision Changes; Glasses / Contacts Medical History: Negative for: Cataracts; Glaucoma; Optic Neuritis Ear/Nose/Mouth/Throat Complaints and Symptoms: Negative for: Difficult clearing ears; Sinusitis Medical History: Negative for: Chronic sinus problems/congestion; Middle ear problems Hematologic/Lymphatic Complaints and Symptoms: Negative for: Bleeding / Clotting Disorders; Human Immunodeficiency Virus Respiratory Complaints and Symptoms: Negative for: Chronic or frequent coughs; Shortness of Breath Medical History: Negative for: Aspiration; Asthma; Chronic Obstructive Pulmonary Disease (COPD); Pneumothorax; Sleep Apnea; Tuberculosis Cardiovascular Complaints and Symptoms: Positive for: LE edema Negative for: Chest pain Medical History: Positive for: Hypertension Negative for: Angina; Arrhythmia; Congestive Heart Failure; Coronary Artery Disease; Deep Vein Thrombosis; Hypotension; Myocardial Infarction; Peripheral Arterial Disease; Peripheral Venous Disease; Phlebitis; Vasculitis Gastrointestinal Complaints and Symptoms: Negative for: Frequent  diarrhea; Nausea; Vomiting Medical History: Negative for: Cirrhosis ; Colitis; Crohnos; Hepatitis A; Hepatitis B; Hepatitis C Endocrine Complaints and Symptoms: Positive for: Thyroid disease Negative for: Hepatitis; Polydypsia (Excessive Thirst) LEYTON, MAGOON ([id number]) Medical History: Negative for: Type I Diabetes; Type II Diabetes Genitourinary Complaints and Symptoms: Negative for: Kidney failure/ Dialysis; Incontinence/dribbling Medical History: Negative for: End Stage Renal Disease Immunological Complaints and Symptoms: Negative for: Hives; Itching Medical History: Negative for: Lupus Erythematosus; Raynaudos; Scleroderma Integumentary (Skin) Complaints and Symptoms: Positive for: Wounds; Swelling Negative for: Bleeding or bruising tendency; Breakdown Medical History: Negative for: History of Burn; History of pressure wounds Musculoskeletal Complaints and Symptoms: Negative for: Muscle Pain; Muscle Weakness Medical History: Positive for: Osteoarthritis Negative for: Gout; Rheumatoid Arthritis; Osteomyelitis Past Medical History Notes: osteoporosis Neurologic Complaints and Symptoms: Negative for: Numbness/parasthesias; Focal/Weakness Medical History: Negative for: Dementia; Neuropathy; Quadriplegia; Paraplegia; Seizure Disorder Psychiatric Complaints and Symptoms: Negative for: Anxiety; Claustrophobia Medical History: Negative for: Anorexia/bulimia; Confinement Anxiety Oncologic Medical History: Positive for: Received Radiation Negative for: Received Chemotherapy Past Medical History Notes: metastatic prostate cancer Immunizations Pneumococcal Vaccine: Received Pneumococcal Vaccination: Yes Implantable Devices None KADEEN, SROKA ([id number]) Family and Social History Current every day smoker; Marital Status - Married; Alcohol Use: Never; Drug Use: No History; Caffeine Use: Moderate Electronic Signature(s) Signed: 12/05/2020 5:04:06 PM By:  Georges Mouse, Minus Breeding RN Signed: 12/06/2020 2:54:56 PM By: Linton Ham Kevin Donovan Entered By: Georges Mouse, Minus Breeding on 12/05/2020 14:56:35 Kevin Donovan ([id number]) -------------------------------------------------------------------------------- SuperBill Details Patient Name: Kevin Donovan Date of Service: 12/05/2020 Medical Record Number: [id number] Patient Account Number: [id number] Date of Birth/Sex: 05-15-1936 (85 y.o. M) Treating RN: Cornell Barman Primary Care Provider: Steele Sizer Other Clinician: Jeanine Luz Referring Provider: Steele Sizer Treating Provider/Extender: Tito Dine in Treatment: 0 Diagnosis Coding ICD-10 Codes Code Description [phone] Non-pressure chronic ulcer of other part of left foot with other specified severity I96 Gangrene, not elsewhere classified Facility Procedures CPT4 Code: 87564332 Description: [place] VISIT-LEV 4 EST PT Modifier: Quantity: 1 CPT4 Code: [id number] Description: 60630 - [place]BR[place] W/O ANES NON SELECT Modifier: Quantity: 1 CPT4 Code: [id number] Description: 35573 - [place] W/O ANES NON SELECT Modifier: Quantity: 1 Physician Procedures CPT4 Code: 2202542 Description: 70623 - WC PHYS LEVEL 4 - NEW PT Modifier: Quantity: 1 CPT4 Code: Description: ICD-10 Diagnosis Description L97.528 Non-pressure chronic ulcer of other part of left foot with other specifie I96 Gangrene, not elsewhere classified Modifier: d severity Quantity: Electronic Signature(s) Signed: 12/06/2020 2:54:56 PM By: Linton Ham Kevin Donovan Entered By: Linton Ham on 12/05/2020 18:01:55

## 2020-12-11 ENCOUNTER — Telehealth: Payer: Self-pay | Admitting: Emergency Medicine

## 2020-12-11 NOTE — Telephone Encounter (Signed)
Patient notified

## 2020-12-11 NOTE — Telephone Encounter (Signed)
Patient wife called and stated patient foot is not getting any better. They will go back to wound care on 12/12/20. She has been trying to keep it clean until there follow up. It still smells bad. Would like a referral to have a consult with surgery.  Patient was offered to again discuss [place] with her husband to come out and she refused services. Vascular is scheduled for April at [facility]

## 2020-12-12 ENCOUNTER — Encounter: Payer: Medicare HMO | Admitting: Internal Medicine

## 2020-12-12 ENCOUNTER — Other Ambulatory Visit: Payer: Self-pay

## 2020-12-12 DIAGNOSIS — L97528 Non-pressure chronic ulcer of other part of left foot with other specified severity: Secondary | ICD-10-CM | POA: Diagnosis not present

## 2020-12-13 ENCOUNTER — Ambulatory Visit: Admit: 2020-12-13 | Discharge: 2020-12-14 | Payer: MEDICARE

## 2020-12-13 ENCOUNTER — Ambulatory Visit: Admit: 2020-12-13 | Discharge: 2020-12-14 | Payer: MEDICARE | Attending: Specialist | Primary: Specialist

## 2020-12-13 DIAGNOSIS — I2782 Chronic pulmonary embolism: Principal | ICD-10-CM

## 2020-12-13 DIAGNOSIS — I2699 Other pulmonary embolism without acute cor pulmonale: Principal | ICD-10-CM

## 2020-12-13 DIAGNOSIS — I739 Peripheral vascular disease, unspecified: Principal | ICD-10-CM

## 2020-12-13 DIAGNOSIS — I96 Gangrene, not elsewhere classified: Principal | ICD-10-CM

## 2020-12-13 MED ORDER — DOXYCYCLINE HYCLATE 100 MG CAPSULE
ORAL_CAPSULE | Freq: Two times a day (BID) | ORAL | 0 refills | 10 days | Status: CP
Start: 2020-12-13 — End: 2020-12-23

## 2020-12-13 NOTE — Progress Notes (Signed)
ELIGHA, KMETZ ([id number]) Visit Report for 12/12/2020 HPI Details Patient Name: Kevin Donovan, Kevin Donovan. Date of Service: 12/12/2020 11:15 AM Medical Record Number: [id number] Patient Account Number: [id number] Date of Birth/Sex: [birth year] (85 y.o. M) Treating RN: Cornell Barman Primary Care Provider: Steele Sizer Other Clinician: Jeanine Luz Referring Provider: Steele Sizer Treating Provider/Extender: Tito Dine in Treatment: 1 History of Present Illness HPI Description: ADMISSION 12/05/2020. This is a medically complex 85 year old man who arrives in clinic today for review of wounds in his left forefoot predominantly involving his toes. He is not a diabetic. His problem began late last month he said he was picking some skin off the dorsal aspect of his left first toe and then he developed rapidly progressive ischemic changes in the toes with pain. He was seen in the ER on 11/17/2019 with acute pain in the left foot. He was felt to have acute embolism or thrombosis he was heparinized spent several days in the ER but was ultimately discharged. On 2/28 8 his primary physician sent him back to the ER. He underwent a CTA which is recorded below from his kidneys through his toes. He was not found to have significant macrovascular disease that could be identified. He was not admitted to hospital. He underwent a slew of lab work that showed lung other things a positive ANA at 1-80, also a positive ANCA. HIV was negative. C3 and C4 were normal his PT was 17.9 although he is on Eliquis PTT was normal CRP was 32. He has a follow-up with vascular surgery at the beginning of April. His primary physician sent him over here urgently. According to the patient she was wondering about adding additional medications presumably antiplatelet medications. -Short segment focal occlusion of the distal left superficial femoral artery. -Three vessel run off bilatearlly Nonvascular findings: -No  significant change in hepatic and nodal metastases as described above. -Redemonstration ofomoderate left hydroureteronephrosis with delayed nephrogram to the level of the mid ureter where there is evidence of luminal enhancement. Narrative Performed by [facility] RAD EXAM: CTA aorta and iliofemoral runoff DATE: 11/19/2020 9:27 PM ACCESSION: [id number] UN DICTATED: 11/19/2020 10:18 PM INTERPRETATION LOCATION: [place] CLINICAL INDICATION: 85 years old Male with ABI w/ concern for arterial obstruction ; Claudication or leg ischemia o COMPARISON: CT abdomen and pelvis on 10/23/2020. TECHNIQUE: A spiral CTA scan was obtained with IV contrast from the top of the kidneys through the toes. oImages were reconstructed in the axial plane. oMultiplanar reformatted and MIP images were provided for further evaluation of the vessels. oFor selected cases, 3D volume rendered images are also provided. Findings: VASCULAR: Aorta: Moderate scattered atherosclerotic plaques throughout the abdominal aorta. Patent Celiac artery: Patent Superior mesenteric artery: Patent Inferior mesenteric artery: Ostial calcifications. Patent Renal arteries: Single bilateral renal arteries with ostial calcifications. Patent Right lower extremity: Common iliac artery: Mild scattered atherosclerotic plaques. Patent External iliac artery: Mild scattered atherosclerotic plaques. Patent Internal iliac artery: Moderate scattered atherosclerotic plaques. Patent Common femoral artery: Patent Profunda femoral artery: Patent Superficial femoral artery: Patent Popliteal artery: Few scattered atherosclerotic plaques. Patent Anterior tibial artery: Patent Peroneal tibial trunk: Patent Kevin Donovan, Kevin Donovan ([id number]) Peroneal artery: Patent Posterior tibial artery: Patent Dorsalis pedis artery: Patent Left lower extremity: Common iliac artery: Moderate scattered atherosclerotic plaques. Patent External iliac artery: Mild scattered  atherosclerotic plaques. Patent Internal iliac artery: Moderate scattered atherosclerotic plaques with near complete occlusion of a distal short segment (4:125). Common femoral artery: Patent Profunda femoral artery: Patent Superficial femoral artery: Focal occlusion distally (  4:226) with prompt reconstitution. Popliteal artery: Patent Anterior tibial artery: Patent Peroneal tibial trunk: Patent Peroneal artery: Patent Posterior tibial artery: Patent Dorsalis pedis artery: Patent NONVASCULAR FINDINGS: LOWER THORAX: Partially imaged nodular groundglass opacities in the left upper and lower lobe. Bibasilar atelectasis. HEPATOBILIARY: Redemonstration of rim-enhancing lesions scattered throughout the liver. For reference: -Unchanged 3 cm hepatic segment IVb lesion (4:58). -Unchanged 2.6 cm hepatic segment V lesion (4:72). -Unchanged 2.1 cm hepatic segment VI/VII lesion (4:65). The gallbladder is present and otherwise unremarkable. No biliary dilatation. o SPLEEN: Unremarkable. PANCREAS: Unremarkable. ADRENALS: Unchanged diffuse thickening of bilateral adrenal glands. KIDNEYS/URETERS:oUnchanged delayed left nephrogram with moderate left hydroureteronephrosis to the level of the mid ureter. Decreased enhancement is again noted at this location, similar to prior. Unchanged left periureteral and perinephric stranding. Subcentimeter hypoattenuating lesions scattered throughout bilateral kidneys, too small to characterize. Unchanged right upper pole hypoattenuating renal lesion. Unchanged punctate bilateral renal calculi. BLADDER: Unchanged calculus within the right posterior aspect of the bladder. Diffuse bladder wall thickening, nonspecific. PELVIC/REPRODUCTIVE ORGANS: Prostatomegaly with nodular abutment of the bladder. Bilateral hydroceles. GI TRACT: No dilated or thick walled loops of bowel. Colonic diverticulosis. Normal appendix. PERITONEUM/RETROPERITONEUM AND MESENTERY: No free air or  fluid. LYMPH NODES: Multiple lymph nodes are again seen. For reference: -Stable to slightly decreased size of left para-aortic lymph node now measuring 1.7 cm (4:93) previously 1.9 cm. -Unchanged 1.7 cm precaval node (4:97). -Unchanged 1.1 cm right common iliac chain node (4:107). -Unchanged 1 x 3.3 cm left external iliac nodal mass. -Unchanged 1.4 cm left inguinal node (4:168). BONES AND SOFT TISSUES: No acute osseous abnormality. No lytic or blastic osseous lesions. Degenerative changes of the spine. Subcutaneous nodules involving the anterior abdominal wall likely representing injection granulomas. Procedure Note Reginold Agent, MD - 11/20/2020 Formatting of this note might be different from the original. EXAM: CTA aorta and iliofemoral runoff DATE: 11/19/2020 9:27 PM ACCESSION: [id number] UN DICTATED: 11/19/2020 10:18 PM INTERPRETATION LOCATION: [place] CLINICAL INDICATION: 85 years old Male with ABI w/ concern for arterial obstruction ; Claudication or leg ischemia o COMPARISON: CT abdomen and pelvis on 10/23/2020. TECHNIQUE: A spiral CTA scan was obtained with IV contrast from the top of the kidneys through the toes. oImages were reconstructed in the axial plane. oMultiplanar reformatted and MIP images were provided for further evaluation of the vessels. oFor selected cases, 3D volume rendered images are also provided. Findings: VASCULAR: Aorta: Moderate scattered atherosclerotic plaques throughout the abdominal aorta. Patent Celiac artery: Patent Kevin Donovan, Kevin Donovan ([id number]) Superior mesenteric artery: Patent Inferior mesenteric artery: Ostial calcifications. Patent Renal arteries: Single bilateral renal arteries with ostial calcifications. Patent Right lower extremity: Common iliac artery: Mild scattered atherosclerotic plaques. Patent External iliac artery: Mild scattered atherosclerotic plaques. Patent Internal iliac artery: Moderate scattered atherosclerotic  plaques. Patent Common femoral artery: Patent Profunda femoral artery: Patent Superficial femoral artery: Patent Popliteal artery: Few scattered atherosclerotic plaques. Patent Anterior tibial artery: Patent Peroneal tibialotrunk: Patent Peroneal artery: Patent Posterior tibial artery: Patent Dorsalis pedis artery: Patent Left lower extremity: Common iliac artery: Moderate scattered atherosclerotic plaques. Patent External iliac artery: Mild scattered atherosclerotic plaques. Patent Internal iliac artery: Moderate scattered atherosclerotic plaques with near complete occlusion of a distal short segment (4:125). Common femoral artery: Patent Profunda femoral artery: Patent Superficial femoral artery: Focal occlusion distally (4:226) with prompt reconstitution. Popliteal artery: Patent Anterior tibial artery: Patent Peroneal tibial trunk: Patent Peroneal artery: Patent Posterior tibial artery: Patent Dorsalis pedis artery: Patent NONVASCULAR FINDINGS: LOWER THORAX: Partially imaged nodular groundglass opacities in  the left upper and lower lobe. Bibasilar atelectasis. HEPATOBILIARY: Redemonstration of rim-enhancing lesions scattered throughout the liver. For reference: -Unchanged 3 cm hepaticosegment IVb lesion (4:58). -Unchanged 2.6 cm hepatic segment V lesion (4:72). -Unchanged 2.1 cm hepatic segment VI/VII lesion (4:65). The gallbladder is present and otherwise unremarkable. No biliary dilatation. o SPLEEN: Unremarkable. PANCREAS: Unremarkable. ADRENALS: Unchanged diffuse thickening of bilateral adrenal glands. KIDNEYS/URETERS: Unchanged delayed left nephrogram with moderate left hydroureteronephrosis to the level of the mid ureter. Decreased enhancement is again noted at thisolocation, similar to prior. Unchanged left periureteral and perinephric stranding. Subcentimeter hypoattenuating lesions scattered throughout bilateral kidneys, too small to characterize. Unchanged right upper  pole hypoattenuating renal lesion. Unchanged punctate bilateral renal calculi. BLADDER: Unchanged calculus within the right posterior aspect of the bladder. Diffuse bladder wall thickening, nonspecific. PELVIC/REPRODUCTIVE ORGANS: Prostatomegaly with nodular abutment of the bladder. Bilateralohydroceles. GI TRACT: No dilated or thick walled loops of bowel. Colonic diverticulosis. Normal appendix. PERITONEUM/RETROPERITONEUM AND MESENTERY: No free air or fluid. LYMPH NODES: Multiple lymph nodes are again seen. For reference: -Stable tooslightly decreased size of left para-aortic lymph node now measuring 1.7 cm (4:93) previously 1.9 cm. -Unchanged 1.7 cm precaval node (4:97). -Unchanged 1.1 cm right common iliac chain node (4:107). -Unchanged 1 x 3.3 cm left external iliac nodal mass. -Unchanged 1.4 cm left inguinal node (4:168). BONES AND SOFT TISSUES: No acute osseous abnormality. No lytic or blastic osseous lesions. Degenerative changes of the spine. Subcutaneous nodules involving the anterior abdominal wall likely representing injection granulomas. IMPRESSION: Vascular findings: -Short segment focal occlusion of the distal left superficial femoral artery. -Three vessel run off bilatearlly Nonvascular findings: -No significant change in hepatic and nodal metastases as described above. -Redemonstration of moderate left hydroureteronephrosis with delayed nephrogram to the level of the mid ureter where there is evidence of luminal enhancement. Kevin Donovan, Kevin Donovan ([id number]) Exam End: 11/19/20 o9:27 PM Past medical history includes stage III chronic renal failure, he had a pulmonary embolism in February, he has metastatic prostate cancer. He is not a diabetic but he is a smoker 12/12/2020; Patient arrives back in clinic with really no complaints. All 5 toes on the left foot have become gangrenous and are on their way to mummification. They do not look infected. Thankfully the reniform purpura  pattern on the plantar foot on the left has resolved and this looks a lot better this week. To have a follow-up with [facility] on April 1. Referencing my note above I am unsure about the diagnosis but I think this probably represents microvascular emboli/cholesterol emboli. The exact source of this is not clear. The pattern of revascularization and hence improvement in the left heel before he came here and more recently the left forefoot he was difficult to explain any other way. The patient did not have anything on his CTA that he had through the emergency room Coloring this patient's approach is the metastatic prostate cancer with some suggestion he should be on hospice. Electronic Signature(s) Signed: 12/12/2020 5:15:48 PM By: Linton Ham MD Entered By: Linton Ham on 12/12/2020 12:18:23 Kevin Donovan ([id number]) -------------------------------------------------------------------------------- Physical Exam Details Patient Name: JACAI, KIPP Date of Service: 12/12/2020 11:15 AM Medical Record Number: [id number] Patient Account Number: [id number] Date of Birth/Sex: 1936-08-09 (85 y.o. M) Treating RN: Cornell Barman Primary Care Provider: Steele Sizer Other Clinician: Jeanine Luz Referring Provider: Steele Sizer Treating Provider/Extender: Tito Dine in Treatment: 1 Constitutional Sitting or standing Blood Pressure is within target range for patient.. Pulse regular and within target range  for patient.Marland Kitchen Respirations regular, non- labored and within target range.. Temperature is normal and within the target range for the patient.Marland Kitchen appears in no distress. Cardiovascular Left popliteal and left femoral pulses are palpable. Pedal pulses are absent in the left. Notes Wound exam; the patient now has extensive dry gangrene on all 5 toes. Fortunately unless these are manipulated they do not appear to be painful and there is no evidence of spreading infection. The  purpuric pattern on the left forefoot on the plantar aspect [retiform purpura] from last week has resolved and the ischemia now seems fairly localized to the digital artery Electronic Signature(s) Signed: 12/12/2020 5:15:48 PM By: Linton Ham MD Entered By: Linton Ham on 12/12/2020 12:27:48 Kevin Donovan ([id number]) -------------------------------------------------------------------------------- Physician Orders Details Patient Name: Kevin Donovan Date of Service: 12/12/2020 11:15 AM Medical Record Number: [id number] Patient Account Number: [id number] Date of Birth/Sex: 1935-10-14 (85 y.o. M) Treating RN: Cornell Barman Primary Care Provider: Steele Sizer Other Clinician: Jeanine Luz Referring Provider: Steele Sizer Treating Provider/Extender: Tito Dine in Treatment: 1 Verbal / Phone Orders: No Diagnosis Coding Follow-up Appointments o Return Appointment in 1 week. Bathing/ Shower/ Hygiene o May shower; gently cleanse wound with antibacterial soap, rinse and pat dry prior to dressing wounds Wound Treatment Wound #1 - Toe Great Wound Laterality: Left Primary Dressing: SILVERCEL Antimicrobial Alginate Dressing, 1x12 (in/in) (Dispense As Written) 1 x Per Day/30 Days Discharge Instructions: weave in between toes Secondary Dressing: Conforming Guaze Roll-Medium (Generic) 1 x Per Day/30 Days Discharge Instructions: Apply Conforming Stretch Guaze Bandage as directed Secondary Dressing: Gauze (Generic) 1 x Per Day/30 Days Discharge Instructions: As directed: dry, moistened with saline or moistened with Dakins Solution Secured With: 75M Medipore H Soft Cloth Surgical Tape, 2x2 (in/yd) (Generic) 1 x Per Day/30 Days Wound #2 - Foot Wound Laterality: Left Primary Dressing: SILVERCEL Antimicrobial Alginate Dressing, 1x12 (in/in) (Dispense As Written) 1 x Per Day/30 Days Discharge Instructions: weave in between toes Secondary Dressing: Conforming Guaze  Roll-Medium (Generic) 1 x Per Day/30 Days Discharge Instructions: Apply Conforming Stretch Guaze Bandage as directed Secondary Dressing: Gauze (Generic) 1 x Per Day/30 Days Discharge Instructions: As directed: dry, moistened with saline or moistened with Dakins Solution Secured With: 75M Medipore H Soft Cloth Surgical Tape, 2x2 (in/yd) (Generic) 1 x Per Day/30 Days Electronic Signature(s) Signed: 12/12/2020 5:15:48 PM By: Linton Ham MD Signed: 12/12/2020 6:23:16 PM By: Gretta Cool, BSN, RN, CWS, Kim RN, BSN Entered By: Gretta Cool, BSN, RN, CWS, Kim on 12/12/2020 11:55:35 Kevin Donovan ([id number]) -------------------------------------------------------------------------------- Problem List Details Patient Name: Kevin Donovan, Kevin Donovan. Date of Service: 12/12/2020 11:15 AM Medical Record Number: [id number] Patient Account Number: [id number] Date of Birth/Sex: 08-14-1936 (85 y.o. M) Treating RN: Cornell Barman Primary Care Provider: Steele Sizer Other Clinician: Jeanine Luz Referring Provider: Steele Sizer Treating Provider/Extender: Tito Dine in Treatment: 1 Active Problems ICD-10 Encounter Code Description Active Date MDM Diagnosis L97.528 Non-pressure chronic ulcer of other part of left foot with other specified 12/05/2020 No Yes severity I96 Gangrene, not elsewhere classified 12/05/2020 No Yes Inactive Problems Resolved Problems Electronic Signature(s) Signed: 12/12/2020 5:15:48 PM By: Linton Ham MD Entered By: Linton Ham on 12/12/2020 12:12:16 Kevin Donovan ([id number]) -------------------------------------------------------------------------------- Progress Note Details Patient Name: Kevin Donovan Date of Service: 12/12/2020 11:15 AM Medical Record Number: [id number] Patient Account Number: [id number] Date of Birth/Sex: 18-Apr-1936 (85 y.o. M) Treating RN: Cornell Barman Primary Care Provider: Steele Sizer Other Clinician: Jeanine Luz Referring Provider: Steele Sizer Treating Provider/Extender: Dellia Nims  MICHAEL G Weeks in Treatment: 1 Subjective History of Present Illness (HPI) ADMISSION 12/05/2020. This is a medically complex 85 year old man who arrives in clinic today for review of wounds in his left forefoot predominantly involving his toes. He is not a diabetic. His problem began late last month he said he was picking some skin off the dorsal aspect of his left first toe and then he developed rapidly progressive ischemic changes in the toes with pain. He was seen in the ER on 11/17/2019 with acute pain in the left foot. He was felt to have acute embolism or thrombosis he was heparinized spent several days in the ER but was ultimately discharged. On 2/28 8 his primary physician sent him back to the ER. He underwent a CTA which is recorded below from his kidneys through his toes. He was not found to have significant macrovascular disease that could be identified. He was not admitted to hospital. He underwent a slew of lab work that showed lung other things a positive ANA at 1-80, also a positive ANCA. HIV was negative. C3 and C4 were normal his PT was 17.9 although he is on Eliquis PTT was normal CRP was 32. He has a follow-up with vascular surgery at the beginning of April. His primary physician sent him over here urgently. According to the patient she was wondering about adding additional medications presumably antiplatelet medications. -Short segment focal occlusion of the distal left superficial femoral artery. -Three vessel run off bilatearlly Nonvascular findings: -No significant change in hepatic and nodal metastases as described above. -Redemonstration of moderate left hydroureteronephrosis with delayed nephrogram to the level of the mid ureter where there is evidence of luminal enhancement. Narrative Performed by [facility] RAD EXAM: CTA aorta and iliofemoral runoff DATE: 11/19/2020 9:27 PM ACCESSION:  [id number] UN DICTATED: 11/19/2020 10:18 PM INTERPRETATION LOCATION: [place] CLINICAL INDICATION: 85 years old Male with ABI w/ concern for arterial obstruction ; Claudication or leg ischemia COMPARISON: CT abdomen and pelvis on 10/23/2020. TECHNIQUE: A spiral CTA scan was obtained with IV contrast from the top of the kidneys through the toes. Images were reconstructed in the axial plane. Multiplanar reformatted and MIP images were provided for further evaluation of the vessels. For selected cases, 3D volume rendered images are also provided. Findings: VASCULAR: Aorta: Moderate scattered atherosclerotic plaques throughout the abdominal aorta. Patent Celiac artery: Patent Superior mesenteric artery: Patent Inferior mesenteric artery: Ostial calcifications. Patent Renal arteries: Single bilateral renal arteries with ostial calcifications. Patent Right lower extremity: Common iliac artery: Mild scattered atherosclerotic plaques. Patent External iliac artery: Mild scattered atherosclerotic plaques. Patent Internal iliac artery: Moderate scattered atherosclerotic plaques. Patent Common femoral artery: Patent Profunda femoral artery: Patent Superficial femoral artery: Patent Popliteal artery: Few scattered atherosclerotic plaques. Patent Anterior tibial artery: Patent Peroneal tibial trunk: Patent Peroneal artery: Patent Posterior tibial artery: Patent Dorsalis pedis artery: Patent Kevin Donovan, Kevin B. ([id number]) Left lower extremity: Common iliac artery: Moderate scattered atherosclerotic plaques. Patent External iliac artery: Mild scattered atherosclerotic plaques. Patent Internal iliac artery: Moderate scattered atherosclerotic plaques with near complete occlusion of a distal short segment (4:125). Common femoral artery: Patent Profunda femoral artery: Patent Superficial femoral artery: Focal occlusion distally (4:226) with prompt reconstitution. Popliteal artery: Patent Anterior  tibial artery: Patent Peroneal tibial trunk: Patent Peroneal artery: Patent Posterior tibial artery: Patent Dorsalis pedis artery: Patent NONVASCULAR FINDINGS: LOWER THORAX: Partially imaged nodular groundglass opacities in the left upper and lower lobe. Bibasilar atelectasis. HEPATOBILIARY: Redemonstration of rim-enhancing lesions scattered throughout the liver. For reference: -Unchanged  3 cm hepatic segment IVb lesion (4:58). -Unchanged 2.6 cm hepatic segment V lesion (4:72). -Unchanged 2.1 cm hepatic segment VI/VII lesion (4:65). The gallbladder is present and otherwise unremarkable. No biliary dilatation. SPLEEN: Unremarkable. PANCREAS: Unremarkable. ADRENALS: Unchanged diffuse thickening of bilateral adrenal glands. KIDNEYS/URETERS: Unchanged delayed left nephrogram with moderate left hydroureteronephrosis to the level of the mid ureter. Decreased enhancement is again noted at this location, similar to prior. Unchanged left periureteral and perinephric stranding. Subcentimeter hypoattenuating lesions scattered throughout bilateral kidneys, too small to characterize. Unchanged right upper pole hypoattenuating renal lesion. Unchanged punctate bilateral renal calculi. BLADDER: Unchanged calculus within the right posterior aspect of the bladder. Diffuse bladder wall thickening, nonspecific. PELVIC/REPRODUCTIVE ORGANS: Prostatomegaly with nodular abutment of the bladder. Bilateral hydroceles. GI TRACT: No dilated or thick walled loops of bowel. Colonic diverticulosis. Normal appendix. PERITONEUM/RETROPERITONEUM AND MESENTERY: No free air or fluid. LYMPH NODES: Multiple lymph nodes are again seen. For reference: -Stable to slightly decreased size of left para-aortic lymph node now measuring 1.7 cm (4:93) previously 1.9 cm. -Unchanged 1.7 cm precaval node (4:97). -Unchanged 1.1 cm right common iliac chain node (4:107). -Unchanged 1 x 3.3 cm left external iliac nodal mass. -Unchanged 1.4 cm  left inguinal node (4:168). BONES AND SOFT TISSUES: No acute osseous abnormality. No lytic or blastic osseous lesions. Degenerative changes of the spine. Subcutaneous nodules involving the anterior abdominal wall likely representing injection granulomas. Procedure Note Reginold Agent, MD - 11/20/2020 Formatting of this note might be different from the original. EXAM: CTA aorta and iliofemoral runoff DATE: 11/19/2020 9:27 PM ACCESSION: [id number] UN DICTATED: 11/19/2020 10:18 PM INTERPRETATION LOCATION: [place] CLINICAL INDICATION: 85 years old Male with ABI w/ concern for arterial obstruction ; Claudication or leg ischemia COMPARISON: CT abdomen and pelvis on 10/23/2020. TECHNIQUE: A spiral CTA scan was obtained with IV contrast from the top of the kidneys through the toes. Images were reconstructed in the axial plane. Multiplanar reformatted and MIP images were provided for further evaluation of the vessels. For selected cases, 3D volume rendered images are also provided. Findings: VASCULAR: Aorta: Moderate scattered atherosclerotic plaques throughout the abdominal aorta. Patent Celiac artery: Patent Superior mesenteric artery: Patent Inferior mesenteric artery: Ostial calcifications. Patent Renal arteries: Single bilateral renal arteries with ostial calcifications. Patent Kevin Donovan, Kevin Donovan ([id number]) Right lower extremity: Common iliac artery: Mild scattered atherosclerotic plaques. Patent External iliac artery: Mild scattered atherosclerotic plaques. Patent Internal iliac artery: Moderate scattered atherosclerotic plaques. Patent Common femoral artery: Patent Profunda femoral artery: Patent Superficial femoral artery: Patent Popliteal artery: Few scattered atherosclerotic plaques. Patent Anterior tibial artery: Patent Peroneal tibial trunk: Patent Peroneal artery: Patent Posterior tibial artery: Patent Dorsalis pedis artery: Patent Left lower extremity: Common iliac  artery: Moderate scattered atherosclerotic plaques. Patent External iliac artery: Mild scattered atherosclerotic plaques. Patent Internal iliac artery: Moderate scattered atherosclerotic plaques with near complete occlusion of a distal short segment (4:125). Common femoral artery: Patent Profunda femoral artery: Patent Superficial femoral artery: Focal occlusion distally (4:226) with prompt reconstitution. Popliteal artery: Patent Anterior tibial artery: Patent Peroneal tibial trunk: Patent Peroneal artery: Patent Posterior tibial artery: Patent Dorsalis pedis artery: Patent NONVASCULAR FINDINGS: LOWER THORAX: Partially imaged nodular groundglass opacities in the left upper and lower lobe. Bibasilar atelectasis. HEPATOBILIARY: Redemonstration of rim-enhancing lesions scattered throughout the liver. For reference: -Unchanged 3 cm hepatic segment IVb lesion (4:58). -Unchanged 2.6 cm hepatic segment V lesion (4:72). -Unchanged 2.1 cm hepatic segment VI/VII lesion (4:65). The gallbladder is present and otherwise unremarkable. No biliary dilatation. SPLEEN: Unremarkable. PANCREAS:  Unremarkable. ADRENALS: Unchanged diffuse thickening of bilateral adrenal glands. KIDNEYS/URETERS: Unchanged delayed left nephrogram with moderate left hydroureteronephrosis to the level of the mid ureter. Decreased enhancement is again noted at this location, similar to prior. Unchanged left periureteral and perinephric stranding. Subcentimeter hypoattenuating lesions scattered throughout bilateral kidneys, too small to characterize. Unchanged right upper pole hypoattenuating renal lesion. Unchanged punctate bilateral renal calculi. BLADDER: Unchanged calculus within the right posterior aspect of the bladder. Diffuse bladder wall thickening, nonspecific. PELVIC/REPRODUCTIVE ORGANS: Prostatomegaly with nodular abutment of the bladder. Bilateral hydroceles. GI TRACT: No dilated or thick walled loops of bowel. Colonic  diverticulosis. Normal appendix. PERITONEUM/RETROPERITONEUM AND MESENTERY: No free air or fluid. LYMPH NODES: Multiple lymph nodes are again seen. For reference: -Stable to slightly decreased size of left para-aortic lymph node now measuring 1.7 cm (4:93) previously 1.9 cm. -Unchanged 1.7 cm precaval node (4:97). -Unchanged 1.1 cm right common iliac chain node (4:107). -Unchanged 1 x 3.3 cm left external iliac nodal mass. -Unchanged 1.4 cm left inguinal node (4:168). BONES AND SOFT TISSUES: No acute osseous abnormality. No lytic or blastic osseous lesions. Degenerative changes of the spine. Subcutaneous nodules involving the anterior abdominal wall likely representing injection granulomas. IMPRESSION: Vascular findings: -Short segment focal occlusion of the distal left superficial femoral artery. -Three vessel run off bilatearlly Nonvascular findings: -No significant change in hepatic and nodal metastases as described above. -Redemonstration of moderate left hydroureteronephrosis with delayed nephrogram to the level of the mid ureter where there is evidence of luminal enhancement. Exam End: 11/19/20 9:27 PM Past medical history includes stage III chronic renal failure, he had a pulmonary embolism in February, he has metastatic prostate cancer. He is Kevin Donovan, Kevin Donovan ([id number]) not a diabetic but he is a smoker 12/12/2020; Patient arrives back in clinic with really no complaints. All 5 toes on the left foot have become gangrenous and are on their way to mummification. They do not look infected. Thankfully the reniform purpura pattern on the plantar foot on the left has resolved and this looks a lot better this week. To have a follow-up with [facility] on April 1. Referencing my note above I am unsure about the diagnosis but I think this probably represents microvascular emboli/cholesterol emboli. The exact source of this is not clear. The pattern of revascularization and hence improvement in the  left heel before he came here and more recently the left forefoot he was difficult to explain any other way. The patient did not have anything on his CTA that he had through the emergency room Coloring this patient's approach is the metastatic prostate cancer with some suggestion he should be on hospice. Objective Constitutional Sitting or standing Blood Pressure is within target range for patient.. Pulse regular and within target range for patient.Marland Kitchen Respirations regular, non- labored and within target range.. Temperature is normal and within the target range for the patient.Marland Kitchen appears in no distress. Vitals Time Taken: 11:19 AM, Height: 70 in, Weight: 165 lbs, BMI: 23.7, Temperature: 98.4 F, Pulse: 79 bpm, Respiratory Rate: 18 breaths/min, Blood Pressure: 133/73 mmHg. Cardiovascular Left popliteal and left femoral pulses are palpable. Pedal pulses are absent in the left. General Notes: Wound exam; the patient now has extensive dry gangrene on all 5 toes. Fortunately unless these are manipulated they do not appear to be painful and there is no evidence of spreading infection. The purpuric pattern on the left forefoot on the plantar aspect [retiform purpura] from last week has resolved and the ischemia now seems fairly localized to  the digital artery Integumentary (Hair, Skin) Wound #1 status is Open. Original cause of wound was Gradually Appeared. The date acquired was: 11/19/2020. The wound has been in treatment 1 weeks. The wound is located on the Left Toe Great. The wound measures 4.5cm length x 3cm width x 0.1cm depth; 10.603cm^2 area and 1.06cm^3 volume. There is no tunneling or undermining noted. There is a medium amount of serosanguineous drainage noted. There is no granulation within the wound bed. There is a large (67-100%) amount of necrotic tissue within the wound bed including Eschar and Adherent Slough. Wound #2 status is Open. Original cause of wound was Gradually Appeared. The  date acquired was: 11/19/2020. The wound has been in treatment 1 weeks. The wound is located on the Left Foot. The wound measures 3.5cm length x 6cm width x 0.1cm depth; 16.493cm^2 area and 1.649cm^3 volume. There is no tunneling or undermining noted. There is a medium amount of serous drainage noted. There is no granulation within the wound bed. There is a large (67-100%) amount of necrotic tissue within the wound bed including Eschar and Adherent Slough. Assessment Active Problems ICD-10 Non-pressure chronic ulcer of other part of left foot with other specified severity Gangrene, not elsewhere classified Plan Follow-up Appointments: Return Appointment in 1 week. Bathing/ Shower/ Hygiene: May shower; gently cleanse wound with antibacterial soap, rinse and pat dry prior to dressing wounds WOUND #1: - Toe Great Wound Laterality: Left Primary Dressing: SILVERCEL Antimicrobial Alginate Dressing, 1x12 (in/in) (Dispense As Written) 1 x Per Day/30 Days Discharge Instructions: weave in between toes Kevin Donovan, Kevin Donovan ([id number]) Secondary Dressing: Conforming Guaze Roll-Medium (Generic) 1 x Per Day/30 Days Discharge Instructions: Apply Conforming Stretch Clayton as directed Secondary Dressing: Gauze (Generic) 1 x Per Day/30 Days Discharge Instructions: As directed: dry, moistened with saline or moistened with Dakins Solution Secured With: 157M [place] Surgical Tape, 2x2 (in/yd) (Generic) 1 x Per Day/30 Days WOUND #2: - Foot Wound Laterality: Left Primary Dressing: SILVERCEL Antimicrobial Alginate Dressing, 1x12 (in/in) (Dispense As Written) 1 x Per Day/30 Days Discharge Instructions: weave in between toes Secondary Dressing: Conforming Guaze Roll-Medium (Generic) 1 x Per Day/30 Days Discharge Instructions: Apply Conforming Stretch Guaze Bandage as directed Secondary Dressing: Gauze (Generic) 1 x Per Day/30 Days Discharge Instructions: As directed: dry, moistened with saline  or moistened with Dakins Solution Secured With: 157M Medipore H Soft Cloth Surgical Tape, 2x2 (in/yd) (Generic) 1 x Per Day/30 Days 1. I think the pattern of this is probably cholesterol emboli. He has had some revascularization accounting for the improvement in his heel and the plantar forefoot pattern we saw last week. 2. Nevertheless I cannot feel his pedal pulses and I am concerned about just making assumptions that I do not have exact prove. I wonder whether he needs an angiogram. I also wonder whether he needs a evaluation for the source of peripheral emboli 3. I am doubtful that this represents a vasculitis 4. I phoned his primary doctor Dr. Ancil Boozer. We agree that he needs to see vascular surgery a little more urgently. Currently I do not think he needs an amputation but certainly it may come to that and I wonder does he have enough blood flow to support simple amputation of the toes 5. As mentioned he does not have infection or uncontrolled pain 6. We are using silver alginate gauze between the toes. May switch to Betadine Electronic Signature(s) Signed: 12/12/2020 5:15:48 PM By: Linton Ham MD Entered By: Linton Ham on 12/12/2020 12:30:08  Kevin Donovan, Kevin Donovan ([id number]) -------------------------------------------------------------------------------- SuperBill Details Patient Name: Kevin Donovan, Kevin Donovan. Date of Service: 12/12/2020 Medical Record Number: [id number] Patient Account Number: [id number] Date of Birth/Sex: [birth year] (85 y.o. M) Treating RN: Cornell Barman Primary Care Provider: Steele Sizer Other Clinician: Jeanine Luz Referring Provider: Steele Sizer Treating Provider/Extender: Tito Dine in Treatment: 1 Diagnosis Coding ICD-10 Codes Code Description [phone] Non-pressure chronic ulcer of other part of left foot with other specified severity I96 Gangrene, not elsewhere classified Facility Procedures CPT4 Code: [id number] Description: 99213 -  WOUND CARE VISIT-LEV 3 EST PT Modifier: Quantity: 1 Physician Procedures CPT4 Code: 3149702 Description: 63785 - WC PHYS LEVEL 4 - EST PT Modifier: Quantity: 1 CPT4 Code: Description: ICD-10 Diagnosis Description L97.528 Non-pressure chronic ulcer of other part of left foot with other specifie I96 Gangrene, not elsewhere classified Modifier: d severity Quantity: Electronic Signature(s) Signed: 12/12/2020 5:15:48 PM By: Linton Ham MD Entered By: Linton Ham on 12/12/2020 12:30:26

## 2020-12-14 ENCOUNTER — Encounter (INDEPENDENT_AMBULATORY_CARE_PROVIDER_SITE_OTHER): Payer: Self-pay | Admitting: Vascular Surgery

## 2020-12-14 ENCOUNTER — Encounter (INDEPENDENT_AMBULATORY_CARE_PROVIDER_SITE_OTHER): Payer: Self-pay

## 2020-12-17 NOTE — Progress Notes (Addendum)
MACON, SANDIFORD ([id number]) Visit Report for 12/12/2020 Arrival Information Details Patient Name: Kevin Donovan, Kevin Donovan. Date of Service: 12/12/2020 11:15 AM Medical Record Number: [id number] Patient Account Number: [id number] Date of Birth/Sex: 01-29-1936 (85 y.o. Male) Treating RN: Carlene Coria Primary Care Giorgia Wahler: Steele Sizer Other Clinician: Jeanine Luz Referring Leontyne Manville: Steele Sizer Treating Rafi Kenneth/Extender: Tito Dine in Treatment: 1 Visit Information History Since Last Visit All ordered tests and consults were completed: No Patient Arrived: Ambulatory Added or deleted any medications: No Arrival Time: 11:14 Any new allergies or adverse reactions: No Accompanied By: wife Had a fall or experienced change in No Transfer Assistance: None activities of daily living that may affect Patient Identification Verified: Yes risk of falls: Secondary Verification Process Completed: Yes Signs or symptoms of abuse/neglect since last visito No Patient Has Alerts: Yes Hospitalized since last visit: No Patient Alerts: Patient on Blood Thinner Implantable device outside of the clinic excluding No **ELIQUIS** cellular tissue based products placed in the center since last visit: Pain Present Now: Yes Electronic Signature(s) Signed: 12/17/2020 5:08:01 PM By: Carlene Coria RN Entered By: Carlene Coria on 12/12/2020 11:19:31 Kevin Donovan ([id number]) -------------------------------------------------------------------------------- Clinic Level of Care Assessment Details Patient Name: Kevin Donovan Date of Service: 12/12/2020 11:15 AM Medical Record Number: [id number] Patient Account Number: [id number] Date of Birth/Sex: 01/18/1936 (85 y.o. Male) Treating RN: Cornell Barman Primary Care Tatem Fesler: Steele Sizer Other Clinician: Jeanine Luz Referring Calib Wadhwa: Steele Sizer Treating Kaylynn Chamblin/Extender: Tito Dine in Treatment: 1 Clinic  Level of Care Assessment Items TOOL 4 Quantity Score []  - Use when only an EandM is performed on FOLLOW-UP visit 0 ASSESSMENTS - Nursing Assessment / Reassessment X - Reassessment of Co-morbidities (includes updates in patient status) 1 10 X- 1 5 Reassessment of Adherence to Treatment Plan ASSESSMENTS - Wound and Skin Assessment / Reassessment []  - Simple Wound Assessment / Reassessment - one wound 0 X- 2 5 Complex Wound Assessment / Reassessment - multiple wounds []  - 0 Dermatologic / Skin Assessment (not related to wound area) ASSESSMENTS - Focused Assessment []  - Circumferential Edema Measurements - multi extremities 0 []  - 0 Nutritional Assessment / Counseling / Intervention []  - 0 Lower Extremity Assessment (monofilament, tuning fork, pulses) []  - 0 Peripheral Arterial Disease Assessment (using hand held doppler) ASSESSMENTS - Ostomy and/or Continence Assessment and Care []  - Incontinence Assessment and Management 0 []  - 0 Ostomy Care Assessment and Management (repouching, etc.) PROCESS - Coordination of Care []  - Simple Patient / Family Education for ongoing care 0 X- 1 20 Complex (extensive) Patient / Family Education for ongoing care []  - 0 Staff obtains Programmer, systems, Records, Test Results / Process Orders []  - 0 Staff telephones HHA, Nursing Homes / Clarify orders / etc []  - 0 Routine Transfer to another Facility (non-emergent condition) []  - 0 Routine Hospital Admission (non-emergent condition) []  - 0 New Admissions / Biomedical engineer / Ordering NPWT, Apligraf, etc. []  - 0 Emergency Hospital Admission (emergent condition) X- 1 10 Simple Discharge Coordination []  - 0 Complex (extensive) Discharge Coordination PROCESS - Special Needs []  - Pediatric / Minor Patient Management 0 []  - 0 Isolation Patient Management []  - 0 Hearing / Language / Visual special needs []  - 0 Assessment of Community assistance (transportation, D/C planning, etc.) []  -  0 Additional assistance / Altered mentation []  - 0 Support Surface(s) Assessment (bed, cushion, seat, etc.) INTERVENTIONS - Wound Cleansing / Measurement Kevin Donovan, Kevin B. ([id number]) []  - 0 Simple Wound Cleansing - one wound  X- 2 5 Complex Wound Cleansing - multiple wounds X- 1 5 Wound Imaging (photographs - any number of wounds) []  - 0 Wound Tracing (instead of photographs) []  - 0 Simple Wound Measurement - one wound X- 2 5 Complex Wound Measurement - multiple wounds INTERVENTIONS - Wound Dressings []  - Small Wound Dressing one or multiple wounds 0 []  - 0 Medium Wound Dressing one or multiple wounds X- 1 20 Large Wound Dressing one or multiple wounds []  - 0 Application of Medications - topical []  - 0 Application of Medications - injection INTERVENTIONS - Miscellaneous []  - External ear exam 0 []  - 0 Specimen Collection (cultures, biopsies, blood, body fluids, etc.) []  - 0 Specimen(s) / Culture(s) sent or taken to Lab for analysis []  - 0 Patient Transfer (multiple staff / Civil Service fast streamer / Similar devices) []  - 0 Simple Staple / Suture removal (25 or less) []  - 0 Complex Staple / Suture removal (26 or more) []  - 0 Hypo / Hyperglycemic Management (close monitor of Blood Glucose) []  - 0 Ankle / Brachial Index (ABI) - do not check if billed separately X- 1 5 Vital Signs Has the patient been seen at the hospital within the last three years: Yes Total Score: 105 Level Of Care: New/Established - Level 3 Electronic Signature(s) Signed: 12/12/2020 6:23:16 PM By: Gretta Cool, BSN, RN, CWS, Kim RN, BSN Entered By: Gretta Cool, BSN, RN, CWS, Kim on 12/12/2020 11:56:56 Kevin Donovan ([id number]) -------------------------------------------------------------------------------- Encounter Discharge Information Details Patient Name: Kevin Donovan, Kevin B. Date of Service: 12/12/2020 11:15 AM Medical Record Number: [id number] Patient Account Number: [id number] Date of Birth/Sex: 1936-04-24  (85 y.o. Male) Treating RN: Dolan Amen Primary Care Leandro Berkowitz: Steele Sizer Other Clinician: Jeanine Luz Referring Earlie Schank: Steele Sizer Treating Rogerio Boutelle/Extender: Tito Dine in Treatment: 1 Encounter Discharge Information Items Discharge Condition: Stable Ambulatory Status: Ambulatory Discharge Destination: Home Transportation: Private Auto Accompanied By: wife Schedule Follow-up Appointment: Yes Clinical Summary of Care: Electronic Signature(s) Signed: 12/12/2020 4:40:52 PM By: Georges Mouse, Minus Breeding RN Entered By: Georges Mouse, Minus Breeding on 12/12/2020 12:07:21 Kevin Donovan ([id number]) -------------------------------------------------------------------------------- Lower Extremity Assessment Details Patient Name: Kevin Donovan, DUTTER. Date of Service: 12/12/2020 11:15 AM Medical Record Number: [id number] Patient Account Number: [id number] Date of Birth/Sex: 07/18/1936 (85 y.o. Male) Treating RN: Carlene Coria Primary Care Deloise Marchant: Steele Sizer Other Clinician: Jeanine Luz Referring Jermario Kalmar: Steele Sizer Treating Mercury Rock/Extender: Ricard Dillon Weeks in Treatment: 1 Edema Assessment Assessed: [Left: No] [Right: No] [Left: Edema] [Right: :] Calf Left: Right: Point of Measurement: 31 cm From Medial Instep 35 cm Ankle Left: Right: Point of Measurement: 10 cm From Medial Instep 24 cm Vascular Assessment Pulses: Dorsalis Pedis Palpable: [Left:No] Electronic Signature(s) Signed: 12/17/2020 5:08:01 PM By: Carlene Coria RN Entered By: Carlene Coria on 12/12/2020 11:31:27 Kevin Donovan ([id number]) -------------------------------------------------------------------------------- Multi Wound Chart Details Patient Name: Kevin Donovan. Date of Service: 12/12/2020 11:15 AM Medical Record Number: [id number] Patient Account Number: [id number] Date of Birth/Sex: June 25, 1936 (85 y.o. Male) Treating RN: Cornell Barman Primary Care  Saranda Legrande: Steele Sizer Other Clinician: Jeanine Luz Referring Wyn Nettle: Steele Sizer Treating Camdyn Laden/Extender: Tito Dine in Treatment: 1 Vital Signs Height(in): 70 Pulse(bpm): 73 Weight(lbs): 165 Blood Pressure(mmHg): 133/73 Body Mass Index(BMI): 24 Temperature(F): 98.4 Respiratory Rate(breaths/min): 18 Photos: [N/A:N/A] Wound Location: Left Toe Great Left Foot N/A Wounding Event: Gradually Appeared Gradually Appeared N/A Primary Etiology: Arterial Insufficiency Ulcer Arterial Insufficiency Ulcer N/A Comorbid History: Hypertension, Osteoarthritis, Hypertension, Osteoarthritis, N/A Received Radiation Received Radiation Date Acquired: 11/19/2020 11/19/2020 N/A Weeks of  Treatment: 1 1 N/A Wound Status: Open Open N/A Measurements L x W x D (cm) 4.5x3x0.1 3.5x6x0.1 N/A Area (cm) : 10.603 16.493 N/A Volume (cm) : 1.06 1.649 N/A % Reduction in Area: -399.90% -20.00% N/A % Reduction in Volume: -400.00% -20.00% N/A Classification: Full Thickness Without Exposed Unclassifiable N/A Support Structures Exudate Amount: Medium Medium N/A Exudate Type: Serosanguineous Serous N/A Exudate Color: red, brown amber N/A Granulation Amount: None Present (0%) None Present (0%) N/A Necrotic Amount: Large (67-100%) Large (67-100%) N/A Necrotic Tissue: Eschar, Adherent [place] N/A Exposed Structures: Fascia: No Fascia: No N/A Fat Layer (Subcutaneous Tissue): Fat Layer (Subcutaneous Tissue): No No Tendon: No Tendon: No Muscle: No Muscle: No Joint: No Joint: No Bone: No Bone: No Epithelialization: None None N/A Treatment Notes Wound #1 (Toe Great) Wound Laterality: Left Cleanser Peri-Wound Care Topical Primary Dressing SILVERCEL Antimicrobial Alginate Dressing, 1x12 (in/in) HASSANI, SLINEY ([id number]) Quantity: 1 Discharge Instruction: weave in between toes Secondary Dressing Conforming Guaze Roll-Medium Quantity: 1 Discharge  Instruction: Apply Conforming Stretch Guaze Bandage as directed Gauze Discharge Instruction: As directed: dry, moistened with saline or moistened with Dakins Solution Secured With 72M Medipore H Soft Cloth Surgical Tape, 2x2 (in/yd) Compression Wrap Compression Stockings Add-Ons Wound #2 (Foot) Wound Laterality: Left Cleanser Peri-Wound Care Topical Primary Dressing SILVERCEL Antimicrobial Alginate Dressing, 1x12 (in/in) Quantity: 1 Discharge Instruction: weave in between toes Secondary Dressing Conforming Guaze Roll-Medium Quantity: 1 Discharge Instruction: Apply Conforming Stretch Guaze Bandage as directed Gauze Discharge Instruction: As directed: dry, moistened with saline or moistened with Dakins Solution Secured With 72M Medipore H Soft Cloth Surgical Tape, 2x2 (in/yd) Compression Wrap Compression Stockings Add-Ons Electronic Signature(s) Signed: 12/12/2020 5:15:48 PM By: Linton Ham MD Entered By: Linton Ham on 12/12/2020 12:12:30 Kevin Donovan ([id number]) -------------------------------------------------------------------------------- [place] Plan Details Patient Name: Kevin Donovan, Kevin B. Date of Service: 12/12/2020 11:15 AM Medical Record Number: [id number] Patient Account Number: [id number] Date of Birth/Sex: 1936/01/19 (85 y.o. Male) Treating RN: Cornell Barman Primary Care Kymiah Araiza: Steele Sizer Other Clinician: Jeanine Luz Referring Aubrielle Stroud: Steele Sizer Treating Kees Idrovo/Extender: Tito Dine in Treatment: 1 Active Inactive Electronic Signature(s) Signed: 01/02/2021 5:50:36 PM By: Gretta Cool, BSN, RN, CWS, Kim RN, BSN Previous Signature: 12/12/2020 6:23:16 PM Version By: Gretta Cool, BSN, RN, CWS, Kim RN, BSN Entered By: Gretta Cool, BSN, RN, CWS, Kim on 01/02/2021 17:50:36 Kevin Donovan ([id number]) -------------------------------------------------------------------------------- Pain Assessment Details Patient Name:  Kevin Donovan, VOSSLER. Date of Service: 12/12/2020 11:15 AM Medical Record Number: [id number] Patient Account Number: [id number] Date of Birth/Sex: 16-Jan-1936 (85 y.o. Male) Treating RN: Carlene Coria Primary Care Zahra Peffley: Steele Sizer Other Clinician: Jeanine Luz Referring Carlen Rebuck: Steele Sizer Treating Tyran Huser/Extender: Tito Dine in Treatment: 1 Active Problems Location of Pain Severity and Description of Pain Patient Has Paino Yes Site Locations With Dressing Change: Yes Duration of the Pain. Constant / Intermittento Intermittent How Long Does it Lasto Hours: Minutes: 10 Rate the pain. Current Pain Level: 0 Worst Pain Level: 5 Least Pain Level: 0 Tolerable Pain Level: 5 Character of Pain Describe the Pain: Other: grabbing Pain Management and Medication Current Pain Management: Medication: Yes Cold Application: No Rest: Yes Massage: No Activity: No T.E.N.S.: No Heat Application: No Leg drop or elevation: No Is the Current Pain Management Adequate: Inadequate How does your wound impact your activities of daily livingo Sleep: Yes Bathing: No Appetite: Yes Relationship With Others: No Bladder Continence: No Emotions: No Bowel Continence: No Work: No Toileting: No Drive: No Dressing: No Hobbies: No Electronic  Signature(s) Signed: 12/17/2020 5:08:01 PM By: Carlene Coria RN Entered By: Carlene Coria on 12/12/2020 11:21:12 Kevin Donovan ([id number]) -------------------------------------------------------------------------------- Patient/Caregiver Education Details Patient Name: Kevin Donovan Date of Service: 12/12/2020 11:15 AM Medical Record Number: [id number] Patient Account Number: [id number] Date of Birth/Gender: 1935-12-04 (85 y.o. Male) Treating RN: Cornell Barman Primary Care Physician: Steele Sizer Other Clinician: Jeanine Luz Referring Physician: Steele Sizer Treating Physician/Extender: Tito Dine in  Treatment: 1 Education Assessment Education Provided To: Patient Education Topics Provided Wound/Skin Impairment: Handouts: Other: follow-up with vascular Methods: Demonstration, Explain/Verbal Responses: State content correctly Electronic Signature(s) Signed: 12/12/2020 6:23:16 PM By: Gretta Cool, BSN, RN, CWS, Kim RN, BSN Entered By: Gretta Cool, BSN, RN, CWS, Kim on 12/12/2020 11:57:48 Kevin Donovan ([id number]) -------------------------------------------------------------------------------- Wound Assessment Details Patient Name: Kevin Donovan Date of Service: 12/12/2020 11:15 AM Medical Record Number: [id number] Patient Account Number: [id number] Date of Birth/Sex: 03-07-1936 (85 y.o. Male) Treating RN: Carlene Coria Primary Care Romulus Hanrahan: Steele Sizer Other Clinician: Jeanine Luz Referring Bless Belshe: Steele Sizer Treating Cailah Reach/Extender: Tito Dine in Treatment: 1 Wound Status Wound Number: 1 Primary Etiology: Arterial Insufficiency Ulcer Wound Location: Left Toe Great Wound Status: Open Wounding Event: Gradually Appeared Comorbid History: Hypertension, Osteoarthritis, Received Radiation Date Acquired: 11/19/2020 Weeks Of Treatment: 1 Clustered Wound: No Photos Wound Measurements Length: (cm) 4.5 Width: (cm) 3 Depth: (cm) 0.1 Area: (cm) 10.603 Volume: (cm) 1.06 % Reduction in Area: -399.9% % Reduction in Volume: -400% Epithelialization: None Tunneling: No Undermining: No Wound Description Classification: Full Thickness Without Exposed Support Structu Exudate Amount: Medium Exudate Type: Serosanguineous Exudate Color: red, brown res Foul Odor After Cleansing: No Slough/Fibrino Yes Wound Bed Granulation Amount: None Present (0%) Exposed Structure Necrotic Amount: Large (67-100%) Fascia Exposed: No Necrotic Quality: Eschar, Adherent Slough Fat Layer (Subcutaneous Tissue) Exposed: No Tendon Exposed: No Muscle Exposed: No Joint  Exposed: No Bone Exposed: No Electronic Signature(s) Signed: 12/17/2020 5:08:01 PM By: Carlene Coria RN Entered By: Carlene Coria on 12/12/2020 11:28:49 Kevin Donovan ([id number]) -------------------------------------------------------------------------------- Wound Assessment Details Patient Name: Kevin Matar B. Date of Service: 12/12/2020 11:15 AM Medical Record Number: [id number] Patient Account Number: [id number] Date of Birth/Sex: 03-31-1936 (85 y.o. Male) Treating RN: Carlene Coria Primary Care Nera Haworth: Steele Sizer Other Clinician: Jeanine Luz Referring Jemar Paulsen: Steele Sizer Treating Shandell Giovanni/Extender: Tito Dine in Treatment: 1 Wound Status Wound Number: 2 Primary Etiology: Arterial Insufficiency Ulcer Wound Location: Left Foot Wound Status: Open Wounding Event: Gradually Appeared Comorbid History: Hypertension, Osteoarthritis, Received Radiation Date Acquired: 11/19/2020 Weeks Of Treatment: 1 Clustered Wound: No Photos Wound Measurements Length: (cm) 3.5 Width: (cm) 6 Depth: (cm) 0.1 Area: (cm) 16.493 Volume: (cm) 1.649 % Reduction in Area: -20% % Reduction in Volume: -20% Epithelialization: None Tunneling: No Undermining: No Wound Description Classification: Unclassifiable Exudate Amount: Medium Exudate Type: Serous Exudate Color: amber Foul Odor After Cleansing: No Slough/Fibrino Yes Wound Bed Granulation Amount: None Present (0%) Exposed Structure Necrotic Amount: Large (67-100%) Fascia Exposed: No Necrotic Quality: Eschar, Adherent Slough Fat Layer (Subcutaneous Tissue) Exposed: No Tendon Exposed: No Muscle Exposed: No Joint Exposed: No Bone Exposed: No Electronic Signature(s) Signed: 12/17/2020 5:08:01 PM By: Carlene Coria RN Entered By: Carlene Coria on 12/12/2020 11:30:05 Kevin Donovan ([id number]) -------------------------------------------------------------------------------- Vitals Details Patient Name:  Kevin Donovan Date of Service: 12/12/2020 11:15 AM Medical Record Number: [id number] Patient Account Number: [id number] Date of Birth/Sex: 01/14/1936 (85 y.o. Male) Treating RN: Carlene Coria Primary Care Xavien Dauphinais: Steele Sizer Other Clinician: Jeanine Luz Referring Judson Tsan: Steele Sizer Treating Benson Porcaro/Extender:  Kevin Donovan, Kevin Donovan Weeks in Treatment: 1 Vital Signs Time Taken: 11:19 Temperature (F): 98.4 Height (in): 70 Pulse (bpm): 79 Weight (lbs): 165 Respiratory Rate (breaths/min): 18 Body Mass Index (BMI): 23.7 Blood Pressure (mmHg): 133/73 Reference Range: 80 - 120 mg / dl Electronic Signature(s) Signed: 12/17/2020 5:08:01 PM By: Carlene Coria RN Entered By: Carlene Coria on 12/12/2020 11:20:00

## 2020-12-18 ENCOUNTER — Ambulatory Visit: Admit: 2020-12-18 | Discharge: 2020-12-19 | Payer: MEDICARE

## 2020-12-18 DIAGNOSIS — I96 Gangrene, not elsewhere classified: Principal | ICD-10-CM

## 2020-12-18 DIAGNOSIS — Z720 Tobacco use: Principal | ICD-10-CM

## 2020-12-18 DIAGNOSIS — Z01818 Encounter for other preprocedural examination: Principal | ICD-10-CM

## 2020-12-18 DIAGNOSIS — I82502 Chronic embolism and thrombosis of unspecified deep veins of left lower extremity: Principal | ICD-10-CM

## 2020-12-18 DIAGNOSIS — E039 Hypothyroidism, unspecified: Principal | ICD-10-CM

## 2020-12-18 DIAGNOSIS — I10 Essential (primary) hypertension: Principal | ICD-10-CM

## 2020-12-18 DIAGNOSIS — F32A Depression, unspecified depression type: Principal | ICD-10-CM

## 2020-12-19 ENCOUNTER — Ambulatory Visit: Payer: Medicare HMO | Admitting: Internal Medicine

## 2020-12-21 ENCOUNTER — Ambulatory Visit
Admit: 2020-12-21 | Discharge: 2020-12-22 | Payer: MEDICARE | Attending: Physician Assistant | Primary: Physician Assistant

## 2020-12-24 ENCOUNTER — Ambulatory Visit: Admit: 2020-12-24 | Discharge: 2020-12-27 | Payer: MEDICARE

## 2020-12-24 ENCOUNTER — Encounter: Admit: 2020-12-24 | Discharge: 2020-12-27 | Payer: MEDICARE | Attending: Anesthesiology | Primary: Anesthesiology

## 2020-12-24 HISTORY — PX: FOOT AMPUTATION THROUGH METATARSAL: SHX644

## 2020-12-26 DIAGNOSIS — L97409 Non-pressure chronic ulcer of unspecified heel and midfoot with unspecified severity: Principal | ICD-10-CM

## 2020-12-27 ENCOUNTER — Telehealth: Payer: Self-pay

## 2020-12-27 MED ORDER — DOXYCYCLINE HYCLATE 100 MG TABLET
ORAL_TABLET | Freq: Two times a day (BID) | ORAL | 0 refills | 11.00000 days | Status: CP
Start: 2020-12-27 — End: 2021-01-07
  Filled 2020-12-27: qty 22, 11d supply, fill #0

## 2020-12-27 MED ORDER — CIPROFLOXACIN 500 MG TABLET
ORAL_TABLET | Freq: Two times a day (BID) | ORAL | 0 refills | 11.00000 days | Status: CP
Start: 2020-12-27 — End: 2021-01-07

## 2020-12-27 MED ORDER — OXYCODONE 5 MG TABLET
ORAL_TABLET | ORAL | 0 refills | 2.00000 days | Status: CP | PRN
Start: 2020-12-27 — End: 2021-01-01
  Filled 2020-12-27: qty 10, 2d supply, fill #0

## 2020-12-27 NOTE — Telephone Encounter (Signed)
Copied from [place] [id number]. Topic: Quick Communication - Home Health Verbal Orders >> Dec 27, 2020  9:50 AM Loma Boston wrote: [place] form [facility] called requesting Dr Ancil Boozer to be attending to sign orders for [facility] orders. Call pt back/ secure line [phone] office may leave message.

## 2020-12-27 NOTE — Telephone Encounter (Signed)
Completed.

## 2020-12-28 ENCOUNTER — Encounter: Payer: Self-pay | Admitting: Family Medicine

## 2020-12-28 ENCOUNTER — Telehealth: Payer: Self-pay | Admitting: Family Medicine

## 2020-12-28 ENCOUNTER — Encounter: Payer: Self-pay | Admitting: Emergency Medicine

## 2020-12-28 DIAGNOSIS — I96 Gangrene, not elsewhere classified: Secondary | ICD-10-CM | POA: Insufficient documentation

## 2020-12-28 NOTE — Telephone Encounter (Signed)
Home Health Verbal Orders - Caller/Agency: April/ [facility] Callback Number: [phone]/ secure vm can be left  Requesting wound care orders  Frequency: 2x's a week for 2 weeks  and 1x a week for 7 weeks

## 2021-01-02 ENCOUNTER — Telehealth: Payer: Self-pay | Admitting: Family Medicine

## 2021-01-02 NOTE — Telephone Encounter (Signed)
Copied from [place] [phone]. Topic: Medicare AWV >> Jan 02, 2021 10:32 AM Cher Nakai R wrote: Reason for CRM:   Left message for patient to call back and schedule Medicare Annual Wellness Visit (AWV) in office.   If unable to come into the office for AWV,  please offer to do virtually or by telephone.  Last AWV:  01/05/2020  Please schedule at anytime with [place].  40 minute appointment  Any questions, please contact me at [phone]

## 2021-01-08 ENCOUNTER — Ambulatory Visit: Admit: 2021-01-08 | Discharge: 2021-01-09 | Payer: MEDICARE

## 2021-01-08 DIAGNOSIS — Z9889 Other specified postprocedural states: Principal | ICD-10-CM

## 2021-01-24 ENCOUNTER — Ambulatory Visit: Admit: 2021-01-24 | Discharge: 2021-01-25 | Payer: MEDICARE

## 2021-01-25 ENCOUNTER — Ambulatory Visit
Admit: 2021-01-25 | Discharge: 2021-01-26 | Payer: MEDICARE | Attending: Student in an Organized Health Care Education/Training Program | Primary: Student in an Organized Health Care Education/Training Program

## 2021-01-25 ENCOUNTER — Ambulatory Visit: Admit: 2021-01-25 | Discharge: 2021-01-26 | Payer: MEDICARE

## 2021-01-25 DIAGNOSIS — I739 Peripheral vascular disease, unspecified: Principal | ICD-10-CM

## 2021-02-07 ENCOUNTER — Ambulatory Visit: Admit: 2021-02-07 | Discharge: 2021-02-08 | Payer: MEDICARE

## 2021-02-07 DIAGNOSIS — Z9889 Other specified postprocedural states: Principal | ICD-10-CM

## 2021-02-07 DIAGNOSIS — S91105D Unspecified open wound of left lesser toe(s) without damage to nail, subsequent encounter: Principal | ICD-10-CM

## 2021-02-07 MED ORDER — SANTYL 250 UNIT/GRAM TOPICAL OINTMENT
Freq: Every day | TOPICAL | 1 refills | 0.00000 days | Status: CP
Start: 2021-02-07 — End: 2021-03-09

## 2021-02-21 ENCOUNTER — Ambulatory Visit (INDEPENDENT_AMBULATORY_CARE_PROVIDER_SITE_OTHER): Payer: Medicare HMO

## 2021-02-21 DIAGNOSIS — Z0000 Encounter for general adult medical examination without abnormal findings: Secondary | ICD-10-CM

## 2021-02-21 NOTE — Progress Notes (Signed)
Subjective:   Kevin Donovan is a 85 y.o. male who presents for Medicare Annual/Subsequent preventive examination.  Virtual Visit via Telephone Note  I connected with  [name] on 02/21/21 at 10:00 AM EDT by telephone and verified that I am speaking with the correct person using two identifiers.  Location: Patient: home Provider: Otho Persons participating in the virtual visit: Hurt   I discussed the limitations, risks, security and privacy concerns of performing an evaluation and management service by telephone and the availability of in person appointments. The patient expressed understanding and agreed to proceed.  Interactive audio and video telecommunications were attempted between this nurse and patient, however failed, due to patient having technical difficulties OR patient did not have access to video capability.  We continued and completed visit with audio only.  Some vital signs may be absent or patient reported.   Clemetine Marker, LPN    Review of Systems     Cardiac Risk Factors include: advanced age (>37men, >82 women);male gender;hypertension;smoking/ tobacco exposure     Objective:    There were no vitals filed for this visit. There is no height or weight on file to calculate BMI.  Advanced Directives 02/21/2021 01/05/2020 10/31/2016  Does Patient Have a Medical Advance Directive? Yes No No  Type of Paramedic of [place];Living will - -  Copy of [place] in Chart? No - copy requested - -  Would patient like information on creating a medical advance directive? - Yes (MAU/Ambulatory/Procedural Areas - Information given) -    Current Medications (verified) Outpatient Encounter Medications as of 02/21/2021  Medication Sig  . acetaminophen (TYLENOL) 325 MG tablet Take by mouth.  Marland Kitchen alendronate (FOSAMAX) 70 MG tablet Take 70 mg by mouth once a week.  Marland Kitchen apixaban (ELIQUIS) 5 MG TABS tablet Take  5 mg by mouth 2 (two) times daily.  . benazepril (LOTENSIN) 5 MG tablet Take 1 tablet (5 mg total) by mouth daily.  Marland Kitchen escitalopram (LEXAPRO) 10 MG tablet Take 1 tablet (10 mg total) by mouth daily.  Marland Kitchen levothyroxine (SYNTHROID) 137 MCG tablet Take 1 tablet (137 mcg total) by mouth daily before breakfast.  . Multiple Vitamin (MULTI-VITAMINS) TABS Take by mouth.  Annitta Needs ointment Apply topically. Apply topically daily. Small amount of Santyl to left foot wound. Cover with saline soaked gauze, dry gauze, and tape.  . vitamin C (ASCORBIC ACID) 250 MG tablet Take 250 mg by mouth daily.  . [DISCONTINUED] baclofen (LIORESAL) 10 MG tablet Take 1 tablet (10 mg total) by mouth at bedtime.  . [DISCONTINUED] leuprolide (LUPRON) 22.5 MG injection Inject 22.5 mg into the muscle every 6 (six) months.   . [DISCONTINUED] meloxicam (MOBIC) 7.5 MG tablet   . [DISCONTINUED] ondansetron (ZOFRAN ODT) 4 MG disintegrating tablet Take 1 tablet (4 mg total) by mouth every 8 (eight) hours as needed for nausea or vomiting.  . [DISCONTINUED] oxyCODONE (ROXICODONE) 5 MG immediate release tablet Take 1-2 tablets (5-10 mg total) by mouth every 6 (six) hours as needed for severe pain.  . [DISCONTINUED] tamsulosin (FLOMAX) 0.4 MG CAPS capsule Take 0.4 mg by mouth daily.   No facility-administered encounter medications on file as of 02/21/2021.    Allergies (verified) Diazepam   History: Past Medical History:  Diagnosis Date  . Bilateral edema of lower extremity 10/22/2018  . Prostate cancer ([place])   . Thyroid disease    Past Surgical History:  Procedure Laterality Date  .  APPENDECTOMY  2011  . COLONOSCOPY  2005  . FOOT AMPUTATION THROUGH METATARSAL Left 12/24/2020   Amputation left toes of left foot transmetatarsal   Family History  Problem Relation Age of Onset  . Lupus Mother   . Heart disease Mother        CHF  . Heart disease Father    Social History   Socioeconomic History  . Marital status: Married     Spouse name: [name]  . Number of children: 1  . Years of education: Not on file  . Highest education level: Some college, no degree  Occupational History  . Not on file  Tobacco Use  . Smoking status: Current Every Day Smoker    Packs/day: 0.75    Years: 72.00    Pack years: 54.00    Types: Cigarettes    Start date: 01/29/1944  . Smokeless tobacco: Never Used  . Tobacco comment: cutting down   Vaping Use  . Vaping Use: Never used  Substance and Sexual Activity  . Alcohol use: No  . Drug use: No  . Sexual activity: Yes    Partners: Female  Other Topics Concern  . Not on file  Social History Narrative  . Not on file   Social Determinants of Health   Financial Resource Strain: Low Risk   . Difficulty of Paying Living Expenses: Not hard at all  Food Insecurity: No Food Insecurity  . Worried About Charity fundraiser in the Last Year: Never true  . Ran Out of Food in the Last Year: Never true  Transportation Needs: No Transportation Needs  . Lack of Transportation (Medical): No  . Lack of Transportation (Non-Medical): No  Physical Activity: Sufficiently Active  . Days of Exercise per Week: 7 days  . Minutes of Exercise per Session: 90 min  Stress: No Stress Concern Present  . Feeling of Stress : Not at all  Social Connections: Moderately Integrated  . Frequency of Communication with Friends and Family: More than three times a week  . Frequency of Social Gatherings with Friends and Family: Three times a week  . Attends Religious Services: More than 4 times per year  . Active Member of Clubs or Organizations: No  . Attends Archivist Meetings: Never  . Marital Status: Married    Tobacco Counseling Ready to quit: Not Answered Counseling given: Not Answered Comment: cutting down    Clinical Intake:  Pre-visit preparation completed: Yes  Pain : No/denies pain     Nutritional Risks: None Diabetes: No  How often do you need to have someone help you when  you read instructions, pamphlets, or other written materials from your doctor or pharmacy?: 1 - Never    Interpreter Needed?: No  Information entered by :: Clemetine Marker LPN   Activities of Daily Living In your present state of health, do you have any difficulty performing the following activities: 02/21/2021 11/26/2020  Hearing? Y Y  Comment declines hearing aids -  Vision? N N  Difficulty concentrating or making decisions? N N  Walking or climbing stairs? N Y  Dressing or bathing? N N  Doing errands, shopping? N N  Preparing Food and eating ? N -  Using the Toilet? N -  In the past six months, have you accidently leaked urine? Y -  Do you have problems with loss of bowel control? N -  Managing your Medications? N -  Managing your Finances? N -  Housekeeping or managing your Housekeeping?  N -  Some recent data might be hidden    Patient Care Team: Steele Sizer, MD as PCP - General (Family Medicine) Eliezer Mccoy, DPM as Referring Physician (Podiatry)  Indicate any recent Medical Services you may have received from other than [facility] providers in the past year (date may be approximate).     Assessment:   This is a routine wellness examination for Octavio.  Hearing/Vision screen  Hearing Screening   125Hz  250Hz  500Hz  1000Hz  2000Hz  3000Hz  4000Hz  6000Hz  8000Hz   Right ear:           Left ear:           Comments: Pt states mild hearing difficulty  Vision Screening Comments: [facility] screenings done at [facility]  Dietary issues and exercise activities discussed: Current Exercise Habits: Home exercise routine, Type of exercise: Other - see comments (working in yard and garden), Time (Minutes): > 60, Frequency (Times/Week): 7, Weekly Exercise (Minutes/Week): 0, Exercise limited by: orthopedic condition(s)  Goals Addressed            This Visit's Progress   . DIET - INCREASE WATER INTAKE       Recommend drinking 6-8 glasses of water per day        Depression Screen PHQ 2/9 Scores 02/21/2021 11/26/2020 11/19/2020 09/25/2020 08/21/2020 05/18/2020 04/05/2020  PHQ - 2 Score 0 0 1 0 3 0 0  PHQ- 9 Score - 2 6 - 4 2 0    Fall Risk Fall Risk  02/21/2021 11/26/2020 11/19/2020 09/25/2020 08/21/2020  Falls in the past year? 0 0 0 0 0  Number falls in past yr: 0 0 0 0 0  Injury with Fall? 0 0 0 0 0  Risk for fall due to : No Fall Risks - - - -  Follow up Falls prevention discussed - - - -    FALL RISK PREVENTION PERTAINING TO THE HOME:  Any stairs in or around the home? Yes  If so, are there any without handrails? No  Home free of loose throw rugs in walkways, pet beds, electrical cords, etc? Yes  Adequate lighting in your home to reduce risk of falls? Yes   ASSISTIVE DEVICES UTILIZED TO PREVENT FALLS:  Life alert? No  Use of a cane, walker or w/c? No  Grab bars in the bathroom? No  Shower chair or bench in shower? Yes  Elevated toilet seat or a handicapped toilet? Yes   TIMED UP AND GO:  Was the test performed? No . Telephonic visit.   Cognitive Function: Normal cognitive status assessed by direct observation by this Nurse Health Advisor. No abnormalities found.          Immunizations Immunization History  Administered Date(s) Administered  . Fluad Quad(high Dose 65+) 06/17/2019  . Influenza, High Dose Seasonal PF 06/30/2018  . Influenza-Unspecified 06/03/2016, 06/24/2017  . Moderna Sars-Covid-2 Vaccination 10/13/2019, 11/11/2019, 08/09/2020  . Pneumococcal Conjugate-13 10/31/2016  . Pneumococcal Polysaccharide-23 02/25/2013  . Zoster, Live 03/28/2013     TDAP status: Due, Education has been provided regarding the importance of this vaccine. Advised may receive this vaccine at local pharmacy or Health Dept. Aware to provide a copy of the vaccination record if obtained from local pharmacy or Health Dept. Verbalized acceptance and understanding.  Flu Vaccine status: Up to date per patient completed early fall 2021  Pneumococcal  vaccine status: Up to date  Covid-19 vaccine status: Completed vaccines  Qualifies for Shingles Vaccine? Yes   Zostavax completed Yes  Shingrix Completed?: No.    Education has been provided regarding the importance of this vaccine. Patient has been advised to call insurance company to determine out of pocket expense if they have not yet received this vaccine. Advised may also receive vaccine at local pharmacy or Health Dept. Verbalized acceptance and understanding.  Screening Tests Health Maintenance  Topic Date Due  . Zoster Vaccines- Shingrix (1 of 2) Never done  . COVID-19 Vaccine (4 - Booster for Moderna series) 11/09/2020  . INFLUENZA VACCINE  04/22/2021  . TETANUS/TDAP  03/29/2023  . PNA vac Low Risk Adult  Completed  . HPV VACCINES  Aged Out    Health Maintenance  Health Maintenance Due  Topic Date Due  . Zoster Vaccines- Shingrix (1 of 2) Never done  . COVID-19 Vaccine (4 - Booster for Moderna series) 11/09/2020    Colorectal cancer screening: No longer required.   Lung Cancer Screening: (Low Dose CT Chest recommended if Age 10-80 years, 30 pack-year currently smoking OR have quit w/in 15years.) does not qualify.   Additional Screening:  Hepatitis C Screening: does not qualify  Vision Screening: Recommended annual ophthalmology exams for early detection of glaucoma and other disorders of the eye. Is the patient up to date with their annual eye exam?  Yes  Who is the provider or what is the name of the office in which the patient attends annual eye exams? [facility].   Dental Screening: Recommended annual dental exams for proper oral hygiene  Community Resource Referral / Chronic Care Management: CRR required this visit?  No   CCM required this visit?  No      Plan:     I have personally reviewed and noted the following in the patient's chart:   . Medical and social history . Use of alcohol, tobacco or illicit drugs  . Current medications and  supplements including opioid prescriptions. Patient is not currently taking opioid prescriptions. . Functional ability and status . Nutritional status . Physical activity . Advanced directives . List of other physicians . Hospitalizations, surgeries, and ER visits in previous 12 months . Vitals . Screenings to include cognitive, depression, and falls . Referrals and appointments  In addition, I have reviewed and discussed with patient certain preventive protocols, quality metrics, and best practice recommendations. A written personalized care plan for preventive services as well as general preventive health recommendations were provided to patient.     Clemetine Marker, LPN   02/21/5637   Nurse Notes: pt doing well s/p foot surgery; follow up scheduled with podiatry in 2 weeks

## 2021-02-21 NOTE — Patient Instructions (Signed)
Kevin Donovan , Thank you for taking time to come for your Medicare Wellness Visit. I appreciate your ongoing commitment to your health goals. Please review the following plan we discussed and let me know if I can assist you in the future.   Screening recommendations/referrals: Colonoscopy: no longer required Recommended yearly ophthalmology/optometry visit for glaucoma screening and checkup Recommended yearly dental visit for hygiene and checkup  Vaccinations: Influenza vaccine: up to date Pneumococcal vaccine: done 10/31/16 Tdap vaccine: due Shingles vaccine: Shingrix discussed. Please contact your pharmacy for coverage information.  Covid-19: done 10/13/19, 11/11/19 & 08/09/20  Advanced directives: Please bring a copy of your health care power of attorney and living will to the office at your convenience.  Conditions/risks identified: Recommend drinking 6-8 glasses of water per day   Next appointment: Follow up in one year for your annual wellness visit.   Preventive Care 83 Years and Older, Male Preventive care refers to lifestyle choices and visits with your health care provider that can promote health and wellness. What does preventive care include?  A yearly physical exam. This is also called an annual well check.  Dental exams once or twice a year.  Routine eye exams. Ask your health care provider how often you should have your eyes checked.  Personal lifestyle choices, including:  Daily care of your teeth and gums.  Regular physical activity.  Eating a healthy diet.  Avoiding tobacco and drug use.  Limiting alcohol use.  Practicing safe sex.  Taking low doses of aspirin every day.  Taking vitamin and mineral supplements as recommended by your health care provider. What happens during an annual well check? The services and screenings done by your health care provider during your annual well check will depend on your age, overall health, lifestyle risk factors,  and family history of disease. Counseling  Your health care provider may ask you questions about your:  Alcohol use.  Tobacco use.  Drug use.  Emotional well-being.  Home and relationship well-being.  Sexual activity.  Eating habits.  History of falls.  Memory and ability to understand (cognition).  Work and work Statistician. Screening  You may have the following tests or measurements:  Height, weight, and BMI.  Blood pressure.  Lipid and cholesterol levels. These may be checked every 5 years, or more frequently if you are 83 years old.  Skin check.  Lung cancer screening. You may have this screening every year starting at age 16 if you have a 30-pack-year history of smoking and currently smoke or have quit within the past 15 years.  Fecal occult blood test (FOBT) of the stool. You may have this test every year starting at age 66.  Flexible sigmoidoscopy or colonoscopy. You may have a sigmoidoscopy every 5 years or a colonoscopy every 10 years starting at age 59.  Prostate cancer screening. Recommendations will vary depending on your family history and other risks.  Hepatitis C blood test.  Hepatitis B blood test.  Sexually transmitted disease (STD) testing.  Diabetes screening. This is done by checking your blood sugar (glucose) after you have not eaten for a while (fasting). You may have this done every 1-3 years.  Abdominal aortic aneurysm (AAA) screening. You may need this if you are a current or former smoker.  Osteoporosis. You may be screened starting at age 40 if you are at high risk. Talk with your health care provider about your test results, treatment options, and if necessary, the need for more tests. Vaccines  Your health care provider may recommend certain vaccines, such as:  Influenza vaccine. This is recommended every year.  Tetanus, diphtheria, and acellular pertussis (Tdap, Td) vaccine. You may need a Td booster every 10  years.  Zoster vaccine. You may need this after age 65.  Pneumococcal 13-valent conjugate (PCV13) vaccine. One dose is recommended after age 14.  Pneumococcal polysaccharide (PPSV23) vaccine. One dose is recommended after age 45. Talk to your health care provider about which screenings and vaccines you need and how often you need them. This information is not intended to replace advice given to you by your health care provider. Make sure you discuss any questions you have with your health care provider. Document Released: 10/05/2015 Document Revised: 05/28/2016 Document Reviewed: 07/10/2015 Elsevier Interactive Patient Education  2017 [place] Prevention in the Home Falls can cause injuries. They can happen to people of all ages. There are many things you can do to make your home safe and to help prevent falls. What can I do on the outside of my home?  Regularly fix the edges of walkways and driveways and fix any cracks.  Remove anything that might make you trip as you walk through a door, such as a raised step or threshold.  Trim any bushes or trees on the path to your home.  Use bright outdoor lighting.  Clear any walking paths of anything that might make someone trip, such as rocks or tools.  Regularly check to see if handrails are loose or broken. Make sure that both sides of any steps have handrails.  Any raised decks and porches should have guardrails on the edges.  Have any leaves, snow, or ice cleared regularly.  Use sand or salt on walking paths during winter.  Clean up any spills in your garage right away. This includes oil or grease spills. What can I do in the bathroom?  Use night lights.  Install grab bars by the toilet and in the tub and shower. Do not use towel bars as grab bars.  Use non-skid mats or decals in the tub or shower.  If you need to sit down in the shower, use a plastic, non-slip stool.  Keep the floor dry. Clean up any water that  spills on the floor as soon as it happens.  Remove soap buildup in the tub or shower regularly.  Attach bath mats securely with double-sided non-slip rug tape.  Do not have throw rugs and other things on the floor that can make you trip. What can I do in the bedroom?  Use night lights.  Make sure that you have a light by your bed that is easy to reach.  Do not use any sheets or blankets that are too big for your bed. They should not hang down onto the floor.  Have a firm chair that has side arms. You can use this for support while you get dressed.  Do not have throw rugs and other things on the floor that can make you trip. What can I do in the kitchen?  Clean up any spills right away.  Avoid walking on wet floors.  Keep items that you use a lot in easy-to-reach places.  If you need to reach something above you, use a strong step stool that has a grab bar.  Keep electrical cords out of the way.  Do not use floor polish or wax that makes floors slippery. If you must use wax, use non-skid floor wax.  Do  not have throw rugs and other things on the floor that can make you trip. What can I do with my stairs?  Do not leave any items on the stairs.  Make sure that there are handrails on both sides of the stairs and use them. Fix handrails that are broken or loose. Make sure that handrails are as long as the stairways.  Check any carpeting to make sure that it is firmly attached to the stairs. Fix any carpet that is loose or worn.  Avoid having throw rugs at the top or bottom of the stairs. If you do have throw rugs, attach them to the floor with carpet tape.  Make sure that you have a light switch at the top of the stairs and the bottom of the stairs. If you do not have them, ask someone to add them for you. What else can I do to help prevent falls?  Wear shoes that:  Do not have high heels.  Have rubber bottoms.  Are comfortable and fit you well.  Are closed at the  toe. Do not wear sandals.  If you use a stepladder:  Make sure that it is fully opened. Do not climb a closed stepladder.  Make sure that both sides of the stepladder are locked into place.  Ask someone to hold it for you, if possible.  Clearly mark and make sure that you can see:  Any grab bars or handrails.  First and last steps.  Where the edge of each step is.  Use tools that help you move around (mobility aids) if they are needed. These include:  Canes.  Walkers.  Scooters.  Crutches.  Turn on the lights when you go into a dark area. Replace any light bulbs as soon as they burn out.  Set up your furniture so you have a clear path. Avoid moving your furniture around.  If any of your floors are uneven, fix them.  If there are any pets around you, be aware of where they are.  Review your medicines with your doctor. Some medicines can make you feel dizzy. This can increase your chance of falling. Ask your doctor what other things that you can do to help prevent falls. This information is not intended to replace advice given to you by your health care provider. Make sure you discuss any questions you have with your health care provider. Document Released: 07/05/2009 Document Revised: 02/14/2016 Document Reviewed: 10/13/2014 Elsevier Interactive Patient Education  2017 [employer].

## 2021-03-07 ENCOUNTER — Ambulatory Visit: Admit: 2021-03-07 | Discharge: 2021-03-08 | Payer: MEDICARE

## 2021-03-07 DIAGNOSIS — Z9889 Other specified postprocedural states: Principal | ICD-10-CM

## 2021-04-01 ENCOUNTER — Ambulatory Visit
Admission: EM | Admit: 2021-04-01 | Discharge: 2021-04-01 | Disposition: A | Discharge status: Home / Self Care | Payer: Medicare HMO | Attending: Emergency Medicine | Admitting: Emergency Medicine

## 2021-04-01 ENCOUNTER — Other Ambulatory Visit: Payer: Self-pay

## 2021-04-01 ENCOUNTER — Telehealth: Payer: Self-pay | Admitting: Emergency Medicine

## 2021-04-01 ENCOUNTER — Ambulatory Visit (INDEPENDENT_AMBULATORY_CARE_PROVIDER_SITE_OTHER): Payer: Medicare HMO

## 2021-04-01 DIAGNOSIS — R059 Cough, unspecified: Secondary | ICD-10-CM

## 2021-04-01 DIAGNOSIS — J441 Chronic obstructive pulmonary disease with (acute) exacerbation: Secondary | ICD-10-CM | POA: Diagnosis not present

## 2021-04-01 MED ORDER — PREDNISONE 20 MG TABLET
Freq: Every day | 0 days
Start: 2021-04-01 — End: ?

## 2021-04-01 MED ORDER — AMOXICILLIN 875 MG-POTASSIUM CLAVULANATE 125 MG TABLET
Freq: Every day | 0 days
Start: 2021-04-01 — End: ?

## 2021-04-01 MED ORDER — ALBUTEROL SULFATE HFA 90 MCG/ACTUATION AEROSOL INHALER
0 days
Start: 2021-04-01 — End: ?

## 2021-04-01 MED ORDER — ALBUTEROL SULFATE HFA 108 (90 BASE) MCG/ACT IN AERS: 1.0000 | INHALATION_SPRAY | RESPIRATORY_TRACT | 0 refills | Status: AC | PRN

## 2021-04-01 MED ORDER — AMOXICILLIN-POT CLAVULANATE 875-125 MG PO TABS: 1.0000 | ORAL_TABLET | Freq: Two times a day (BID) | ORAL | 0 refills | Status: AC

## 2021-04-01 MED ORDER — PREDNISONE 20 MG PO TABS: 40.0000 mg | ORAL_TABLET | Freq: Every day | ORAL | 0 refills | Status: AC

## 2021-04-01 MED ORDER — AEROCHAMBER PLUS MISC: 2 refills | Status: AC

## 2021-04-01 NOTE — ED Provider Notes (Signed)
HPI  SUBJECTIVE:  Kevin Donovan is a 85 y.o. male who presents with possible "walking pneumonia".  He reports 2 weeks of a cough that is getting worse.  He states that it is productive of tacky, clear sputum.  He reports nausea, no vomiting, diarrhea, abdominal pain.  No fevers, bodies, headaches, nasal congestion, sinus pain or pressure, postnasal drip, loss of sense of smell or taste, wheezing, chest pain, shortness of breath.  He states that these symptoms feel identical to previous episodes of "walking pneumonia" which he has had 4-5 times.  No antibiotics in the past 3 months.  No antipyretic in the past 6 hours.  No known COVID exposure.  He got the COVID booster.  No aggravating or alleviating factors.  Patient has not tried anything for this.  He has a past medical history of prostate cancer, hypertension, smoking half to three quarters of a pack per day since age 41.  No history of pulmonary disease, diabetes.  He is on Eliquis for posttraumatic PE.  UEA:[id number], Drue Stager, MD   Past Medical History:  Diagnosis Date   Bilateral edema of lower extremity 10/22/2018   Prostate cancer [facility])    Thyroid disease     Past Surgical History:  Procedure Laterality Date   APPENDECTOMY  2011   COLONOSCOPY  2005   FOOT AMPUTATION THROUGH METATARSAL Left 12/24/2020   Amputation left toes of left foot transmetatarsal    Family History  Problem Relation Age of Onset   Lupus Mother    Heart disease Mother        CHF   Heart disease Father     Social History   Tobacco Use   Smoking status: Every Day    Packs/day: 0.75    Years: 72.00    Pack years: 54.00    Types: Cigarettes    Start date: 01/29/1944   Smokeless tobacco: Never   Tobacco comments:    cutting down   Vaping Use   Vaping Use: Never used  Substance Use Topics   Alcohol use: No   Drug use: No    No current facility-administered medications for this encounter.  Current Outpatient Medications:    acetaminophen  (TYLENOL) 325 MG tablet, Take by mouth., Disp: , Rfl:    albuterol (VENTOLIN HFA) 108 (90 Base) MCG/ACT inhaler, Inhale 1-2 puffs into the lungs every 4 (four) hours as needed for wheezing or shortness of breath., Disp: 1 each, Rfl: 0   amoxicillin-clavulanate (AUGMENTIN) 875-125 MG tablet, Take 1 tablet by mouth 2 (two) times daily for 7 days., Disp: 14 tablet, Rfl: 0   apixaban (ELIQUIS) 5 MG TABS tablet, Take 5 mg by mouth 2 (two) times daily., Disp: , Rfl:    benazepril (LOTENSIN) 5 MG tablet, Take 1 tablet (5 mg total) by mouth daily., Disp: 90 tablet, Rfl: 1   escitalopram (LEXAPRO) 10 MG tablet, Take 1 tablet (10 mg total) by mouth daily., Disp: 90 tablet, Rfl: 0   levothyroxine (SYNTHROID) 137 MCG tablet, Take 1 tablet (137 mcg total) by mouth daily before breakfast., Disp: 90 tablet, Rfl: 0   Multiple Vitamin (MULTI-VITAMINS) TABS, Take by mouth., Disp: , Rfl:    predniSONE (DELTASONE) 20 MG tablet, Take 2 tablets (40 mg total) by mouth daily with breakfast for 5 days., Disp: 10 tablet, Rfl: 0   SANTYL ointment, Apply topically. Apply topically daily. Small amount of Santyl to left foot wound. Cover with saline soaked gauze, dry gauze, and tape., Disp: , Rfl:  Spacer/Aero-Holding Chambers (AEROCHAMBER PLUS) inhaler, Use with inhaler, Disp: 1 each, Rfl: 2   vitamin C (ASCORBIC ACID) 250 MG tablet, Take 250 mg by mouth daily., Disp: , Rfl:    alendronate (FOSAMAX) 70 MG tablet, Take 70 mg by mouth once a week., Disp: , Rfl:   Allergies  Allergen Reactions   Diazepam Other (See Comments)    Hallucinations, altered mental status     ROS  As noted in HPI.   Physical Exam  BP (!) 143/63 (BP Location: Left Arm)   Pulse 65   Temp 98.4 F (36.9 C) (Oral)   Resp 20   SpO2 99%   Constitutional: Well developed, well nourished, no acute distress Eyes:  EOMI, conjunctiva normal bilaterally HENT: Normocephalic, atraumatic,mucus membranes moist.  No nasal congestion.  No maxillary,  frontal sinus tenderness.  No postnasal drip. Respiratory: Normal inspiratory effort, lungs clear bilaterally, good air movement. Cardiovascular: Normal rate, regular rhythm, no murmurs rubs or gallops GI: nondistended skin: No rash, skin intact Musculoskeletal: no deformities Neurologic: Alert & oriented x 3, no focal neuro deficits Psychiatric: Speech and behavior appropriate   ED Course   Medications - No data to display  Orders Placed This Encounter  Procedures   DG Chest 2 View    Standing Status:   Standing    Number of Occurrences:   1    Order Specific Question:   Reason for Exam (SYMPTOM  OR DIAGNOSIS REQUIRED)    Answer:   cough x 2 weeks r/o PNA bronchitis mets. h/o prostate ca    No results found for this or any previous visit (from the past 24 hour(s)). DG Chest 2 View  Result Date: 04/01/2021 CLINICAL DATA:  2 week history of cough. EXAM: CHEST - 2 VIEW COMPARISON:  09/25/2020 FINDINGS: The lungs are clear without focal pneumonia, edema, pneumothorax or pleural effusion. Interstitial markings are diffusely coarsened with chronic features. The cardiopericardial silhouette is within normal limits for size. The visualized bony structures of the thorax show no acute abnormality. IMPRESSION: Chronic interstitial coarsening. No active cardiopulmonary disease. Electronically Signed   By: Misty Stanley M.D.   On: 04/01/2021 16:59    ED Clinical Impression  1. Cough   2. COPD exacerbation [facility])      ED Assessment/Plan  Did not get flu or COVID testing as it would not change management.  He has been sick for 2 weeks.  Will check chest x-ray.    Reviewed imaging independently.  Chronic interstitial coarsening.  No pneumonia, edema, effusion.  See radiology report for full details.  Suspected COPD exacerbation given long history of smoking.  Will treat with Augmentin, albuterol inhaler with a spacer, prednisone 40 mg for 5 days.  Will defer flu and COVID testing to  PMD.  Discussed  imaging, MDM, treatment plan, and plan for follow-up with patient. Discussed sn/sx that should prompt return to the ED. patient agrees with plan.   Meds ordered this encounter  Medications   amoxicillin-clavulanate (AUGMENTIN) 875-125 MG tablet    Sig: Take 1 tablet by mouth 2 (two) times daily for 7 days.    Dispense:  14 tablet    Refill:  0   albuterol (VENTOLIN HFA) 108 (90 Base) MCG/ACT inhaler    Sig: Inhale 1-2 puffs into the lungs every 4 (four) hours as needed for wheezing or shortness of breath.    Dispense:  1 each    Refill:  0   Spacer/Aero-Holding Chambers (AEROCHAMBER PLUS) inhaler  Sig: Use with inhaler    Dispense:  1 each    Refill:  2    Please educate patient on use   predniSONE (DELTASONE) 20 MG tablet    Sig: Take 2 tablets (40 mg total) by mouth daily with breakfast for 5 days.    Dispense:  10 tablet    Refill:  0       *This clinic note was created using [employer]. Therefore, there may be occasional mistakes despite careful proofreading.  ?    Melynda Ripple, MD 04/01/21 2031

## 2021-04-01 NOTE — Discharge Instructions (Addendum)
Your x-ray was negative for a pneumonia, however it shows that you have some inflammation in your lungs which appears to be chronic.  I suspect that this is from smoking.  I am going to treat this like a COPD exacerbation with Augmentin.  Finish this unless a healthcare provider tells you to stop.  2 puffs from your albuterol inhaler with the spacer every 4-6 hours as needed for coughing, wheezing.  This will open up your lungs and make breathing easier.  Prednisone 40 mg for 5 days, you can also try some Mucinex.  Follow-up with your doctor if you are not getting any better, go to the emergency room if you start to get worse.

## 2021-04-01 NOTE — Telephone Encounter (Signed)
Wife [facility]) called and stated that Kevin Donovan was sick and she felt that he had Pneumonia. She stated that he was coughing up green phlegm, runny nose and low grade fever.   Dr.Sowles notified. Per Dr.Sowles patient is to go to [facility] due to his medical history. His wife was notified and appointment was made online at [facility]

## 2021-04-01 NOTE — ED Triage Notes (Signed)
Pt reports cough x 2 weeks productive for thick, clear mucous.  Wife thinks he had fever but pt does not.  Pt reports posterior R rib pain. Pt states he has had walking pneumonia several times.

## 2021-04-02 DIAGNOSIS — C775 Secondary and unspecified malignant neoplasm of intrapelvic lymph nodes: Principal | ICD-10-CM

## 2021-04-02 DIAGNOSIS — C61 Malignant neoplasm of prostate: Principal | ICD-10-CM

## 2021-04-04 ENCOUNTER — Other Ambulatory Visit: Admit: 2021-04-04 | Discharge: 2021-04-04 | Payer: MEDICARE

## 2021-04-04 ENCOUNTER — Ambulatory Visit: Admit: 2021-04-04 | Discharge: 2021-04-04 | Payer: MEDICARE | Attending: Medical Oncology | Primary: Medical Oncology

## 2021-04-04 ENCOUNTER — Ambulatory Visit: Admit: 2021-04-04 | Discharge: 2021-04-04 | Payer: MEDICARE

## 2021-04-04 DIAGNOSIS — R799 Abnormal finding of blood chemistry, unspecified: Principal | ICD-10-CM

## 2021-04-04 DIAGNOSIS — Z9889 Other specified postprocedural states: Principal | ICD-10-CM

## 2021-04-04 DIAGNOSIS — C775 Secondary and unspecified malignant neoplasm of intrapelvic lymph nodes: Principal | ICD-10-CM

## 2021-04-04 DIAGNOSIS — C61 Malignant neoplasm of prostate: Principal | ICD-10-CM

## 2021-04-04 DIAGNOSIS — Z89432 Acquired absence of left foot: Principal | ICD-10-CM

## 2021-04-04 DIAGNOSIS — Z720 Tobacco use: Principal | ICD-10-CM

## 2021-04-04 DIAGNOSIS — R791 Abnormal coagulation profile: Principal | ICD-10-CM

## 2021-04-04 DIAGNOSIS — R946 Abnormal results of thyroid function studies: Principal | ICD-10-CM

## 2021-04-10 MED ORDER — PROCHLORPERAZINE MALEATE 10 MG TABLET
ORAL_TABLET | Freq: Four times a day (QID) | ORAL | prn refills | 8 days | Status: CP | PRN
Start: 2021-04-10 — End: ?

## 2021-04-10 MED ORDER — DEXAMETHASONE 4 MG TABLET
ORAL_TABLET | ORAL | 6 refills | 42 days | Status: CP
Start: 2021-04-10 — End: ?

## 2021-04-10 MED ORDER — PREDNISONE 5 MG TABLET
ORAL_TABLET | Freq: Two times a day (BID) | ORAL | 6 refills | 30 days | Status: CP
Start: 2021-04-10 — End: ?

## 2021-04-18 ENCOUNTER — Ambulatory Visit: Admit: 2021-04-18 | Discharge: 2021-04-19 | Payer: MEDICARE | Attending: Medical Oncology | Primary: Medical Oncology

## 2021-04-18 ENCOUNTER — Ambulatory Visit: Admit: 2021-04-18 | Discharge: 2021-04-19 | Payer: MEDICARE

## 2021-04-18 ENCOUNTER — Other Ambulatory Visit: Admit: 2021-04-18 | Discharge: 2021-04-19 | Payer: MEDICARE

## 2021-04-18 DIAGNOSIS — C775 Secondary and unspecified malignant neoplasm of intrapelvic lymph nodes: Principal | ICD-10-CM

## 2021-04-18 DIAGNOSIS — C61 Malignant neoplasm of prostate: Principal | ICD-10-CM

## 2021-04-18 DIAGNOSIS — R799 Abnormal finding of blood chemistry, unspecified: Principal | ICD-10-CM

## 2021-04-18 DIAGNOSIS — R946 Abnormal results of thyroid function studies: Principal | ICD-10-CM

## 2021-04-18 DIAGNOSIS — R791 Abnormal coagulation profile: Principal | ICD-10-CM

## 2021-04-19 DIAGNOSIS — R791 Abnormal coagulation profile: Principal | ICD-10-CM

## 2021-04-19 DIAGNOSIS — C61 Malignant neoplasm of prostate: Principal | ICD-10-CM

## 2021-04-19 DIAGNOSIS — C775 Secondary and unspecified malignant neoplasm of intrapelvic lymph nodes: Principal | ICD-10-CM

## 2021-04-19 DIAGNOSIS — R799 Abnormal finding of blood chemistry, unspecified: Principal | ICD-10-CM

## 2021-04-19 DIAGNOSIS — R946 Abnormal results of thyroid function studies: Principal | ICD-10-CM

## 2021-04-25 ENCOUNTER — Ambulatory Visit
Admit: 2021-04-25 | Discharge: 2021-04-29 | Disposition: A | Discharge status: Home / Self Care | Payer: MEDICARE | Admitting: Internal Medicine

## 2021-04-25 MED ORDER — NYSTATIN 100,000 UNIT/ML ORAL SUSPENSION
Freq: Four times a day (QID) | ORAL | 0 refills | 24.00000 days | Status: CP
Start: 2021-04-25 — End: ?

## 2021-04-26 ENCOUNTER — Telehealth: Payer: Self-pay

## 2021-04-26 NOTE — Telephone Encounter (Signed)
Pts wife wanted Dr Ancil Boozer to know that pt is in the hospital

## 2021-04-29 ENCOUNTER — Ambulatory Visit: Payer: Medicare HMO | Admitting: Family Medicine

## 2021-05-07 ENCOUNTER — Ambulatory Visit: Admit: 2021-05-07 | Discharge: 2021-05-08 | Payer: MEDICARE

## 2021-05-07 DIAGNOSIS — Z89432 Acquired absence of left foot: Principal | ICD-10-CM

## 2021-05-07 DIAGNOSIS — Z720 Tobacco use: Principal | ICD-10-CM

## 2021-05-07 DIAGNOSIS — Z9889 Other specified postprocedural states: Principal | ICD-10-CM

## 2021-05-09 ENCOUNTER — Ambulatory Visit: Admit: 2021-05-09 | Discharge: 2021-05-10 | Payer: MEDICARE

## 2021-05-09 ENCOUNTER — Encounter: Admit: 2021-05-09 | Discharge: 2021-05-10 | Payer: MEDICARE

## 2021-05-09 ENCOUNTER — Encounter
Admit: 2021-05-09 | Discharge: 2021-05-10 | Payer: MEDICARE | Attending: Hematology & Oncology | Primary: Hematology & Oncology

## 2021-05-10 MED ORDER — ONDANSETRON 4 MG DISINTEGRATING TABLET
ORAL_TABLET | Freq: Three times a day (TID) | ORAL | 0 refills | 7.00000 days | Status: CP
Start: 2021-05-10 — End: 2021-05-17

## 2021-05-10 MED ORDER — SENNOSIDES 8.6 MG TABLET
ORAL_TABLET | Freq: Every evening | ORAL | 0 refills | 30.00000 days | Status: CP | PRN
Start: 2021-05-10 — End: 2021-06-09

## 2021-05-10 MED ORDER — POLYETHYLENE GLYCOL 3350 17 GRAM ORAL POWDER PACKET
PACK | Freq: Every day | ORAL | 0 refills | 30.00000 days | Status: CP | PRN
Start: 2021-05-10 — End: 2021-06-09

## 2021-05-15 NOTE — Progress Notes (Deleted)
Name: Kevin Donovan   MRN: [id number]    DOB: [birth year]   Date:05/15/2021       Progress Note  Subjective  Chief Complaint  ER Follow Up  HPI  *** Patient Active Problem List   Diagnosis Date Noted   Gangrene of left toes of left foot 12/28/2020   Discoloration of skin of foot 11/20/2020   Elevated serum creatinine 11/20/2020   Left foot pain 11/20/2020   Abnormal coagulation profile 01/28/2019   Rectal bleeding 12/17/2018   Hypothyroidism 12/09/2018   Essential hypertension 10/22/2018   Senile purpura ([place]) 01/28/2018   Hot flashes 05/28/2017   Fatigue 05/28/2017   First degree AV block 11/28/2016   Bradycardia on ECG 11/14/2016   Situational depression 10/31/2016   Calcification of abdominal aorta ([place]) 10/31/2016   Degenerative disc disease, lumbar 10/31/2016   Prostate cancer metastatic to intrapelvic lymph node ([place]) 10/23/2016   Malignant neoplasm of prostate ([place]) 08/27/2012   Enlarged prostate with lower urinary tract symptoms (LUTS) 08/27/2012    Past Surgical History:  Procedure Laterality Date   APPENDECTOMY  2011   COLONOSCOPY  2005   FOOT AMPUTATION THROUGH METATARSAL Left 12/24/2020   Amputation left toes of left foot transmetatarsal    Family History  Problem Relation Age of Onset   Lupus Mother    Heart disease Mother        CHF   Heart disease Father     Social History   Tobacco Use   Smoking status: Every Day    Packs/day: 0.75    Years: 72.00    Pack years: 54.00    Types: Cigarettes    Start date: 01/29/1944   Smokeless tobacco: Never   Tobacco comments:    cutting down   Substance Use Topics   Alcohol use: No     Current Outpatient Medications:    acetaminophen (TYLENOL) 325 MG tablet, Take by mouth., Disp: , Rfl:    albuterol (VENTOLIN HFA) 108 (90 Base) MCG/ACT inhaler, Inhale 1-2 puffs into the lungs every 4 (four) hours as needed for wheezing or shortness of breath., Disp: 1 each, Rfl: 0   alendronate (FOSAMAX) 70 MG  tablet, Take 70 mg by mouth once a week., Disp: , Rfl:    apixaban (ELIQUIS) 5 MG TABS tablet, Take 5 mg by mouth 2 (two) times daily., Disp: , Rfl:    benazepril (LOTENSIN) 5 MG tablet, Take 1 tablet (5 mg total) by mouth daily., Disp: 90 tablet, Rfl: 1   escitalopram (LEXAPRO) 10 MG tablet, Take 1 tablet (10 mg total) by mouth daily., Disp: 90 tablet, Rfl: 0   levothyroxine (SYNTHROID) 137 MCG tablet, Take 1 tablet (137 mcg total) by mouth daily before breakfast., Disp: 90 tablet, Rfl: 0   Multiple Vitamin (MULTI-VITAMINS) TABS, Take by mouth., Disp: , Rfl:    SANTYL ointment, Apply topically. Apply topically daily. Small amount of Santyl to left foot wound. Cover with saline soaked gauze, dry gauze, and tape., Disp: , Rfl:    Spacer/Aero-Holding Chambers (AEROCHAMBER PLUS) inhaler, Use with inhaler, Disp: 1 each, Rfl: 2   vitamin C (ASCORBIC ACID) 250 MG tablet, Take 250 mg by mouth daily., Disp: , Rfl:   Allergies  Allergen Reactions   Diazepam Other (See Comments)    Hallucinations, altered mental status    I personally reviewed {[id number]} with the patient/caregiver today.   ROS  ***  Objective  There were no vitals filed for this visit.  There is no height  or weight on file to calculate BMI.  Physical Exam ***  No results found for this or any previous visit (from the past 2160 hour(s)).  Diabetic Foot Exam: Diabetic Foot Exam - Simple   No data filed    ***  PHQ2/9: Depression screen [facility] 2/9 02/21/2021 11/26/2020 11/19/2020 09/25/2020 08/21/2020  Decreased Interest 0 0 0 0 0  Down, Depressed, Hopeless 0 0 1 0 3  PHQ - 2 Score 0 0 1 0 3  Altered sleeping - 0 0 - 0  Tired, decreased energy - 1 2 - 1  Change in appetite - 1 3 - 0  Feeling bad or failure about yourself  - 0 0 - 0  Trouble concentrating - 0 0 - 0  Moving slowly or fidgety/restless - 0 0 - 0  Suicidal thoughts - 0 0 - 0  PHQ-9 Score - 2 6 - 4  Difficult doing work/chores - - - - -  Some recent  data might be hidden    phq 9 is {gen pos [id number] ***  Fall Risk: Fall Risk  02/21/2021 11/26/2020 11/19/2020 09/25/2020 08/21/2020  Falls in the past year? 0 0 0 0 0  Number falls in past yr: 0 0 0 0 0  Injury with Fall? 0 0 0 0 0  Risk for fall due to : No Fall Risks - - - -  Follow up Falls prevention discussed - - - -   ***   Functional Status Survey:   ***   Assessment & Plan  *** There are no diagnoses linked to this encounter.

## 2021-05-16 ENCOUNTER — Ambulatory Visit: Admit: 2021-05-16 | Discharge: 2021-05-17 | Payer: MEDICARE | Attending: Medical Oncology | Primary: Medical Oncology

## 2021-05-16 ENCOUNTER — Other Ambulatory Visit: Admit: 2021-05-16 | Discharge: 2021-05-17 | Payer: MEDICARE

## 2021-05-16 ENCOUNTER — Ambulatory Visit: Admit: 2021-05-16 | Discharge: 2021-05-17 | Payer: MEDICARE

## 2021-05-16 ENCOUNTER — Ambulatory Visit: Payer: Medicare HMO | Admitting: Family Medicine

## 2021-05-16 DIAGNOSIS — C775 Secondary and unspecified malignant neoplasm of intrapelvic lymph nodes: Principal | ICD-10-CM

## 2021-05-16 DIAGNOSIS — C7951 Secondary malignant neoplasm of bone: Principal | ICD-10-CM

## 2021-05-16 DIAGNOSIS — R946 Abnormal results of thyroid function studies: Principal | ICD-10-CM

## 2021-05-16 DIAGNOSIS — C61 Malignant neoplasm of prostate: Principal | ICD-10-CM

## 2021-05-16 DIAGNOSIS — R5383 Other fatigue: Principal | ICD-10-CM

## 2021-05-16 DIAGNOSIS — G893 Neoplasm related pain (acute) (chronic): Principal | ICD-10-CM

## 2021-05-16 DIAGNOSIS — R799 Abnormal finding of blood chemistry, unspecified: Principal | ICD-10-CM

## 2021-05-16 DIAGNOSIS — R52 Pain, unspecified: Principal | ICD-10-CM

## 2021-05-16 DIAGNOSIS — R791 Abnormal coagulation profile: Principal | ICD-10-CM

## 2021-05-16 MED ORDER — XTAMPZA ER 9 MG CAPSULE SPRINKLE
Freq: Two times a day (BID) | ORAL | 0 refills | 0 days | Status: CP
Start: 2021-05-16 — End: ?

## 2021-05-16 MED ORDER — TAMSULOSIN 0.4 MG CAPSULE
ORAL_CAPSULE | Freq: Every day | ORAL | 3 refills | 90 days | Status: CP
Start: 2021-05-16 — End: 2022-05-16

## 2021-05-16 MED ORDER — OXYCODONE 5 MG TABLET
ORAL_TABLET | ORAL | 0 refills | 30 days | Status: CP | PRN
Start: 2021-05-16 — End: ?

## 2021-05-17 ENCOUNTER — Telehealth: Payer: Self-pay

## 2021-05-17 NOTE — Telephone Encounter (Signed)
Patients wife called and said that all measures have been stopped for the patient and Hospice has been called in for the patients per [facility]

## 2021-05-23 DEATH — deceased

## 2022-02-25 ENCOUNTER — Ambulatory Visit: Payer: Medicare HMO
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
## 2022-11-27 IMAGING — CR DG CHEST 2V
2 series · 2 of 2 positions shown · non-contrast
Comparison: 09/25/2020

CLINICAL DATA: 2 week history of cough.

EXAM:
CHEST - 2 VIEW

[chest pa]
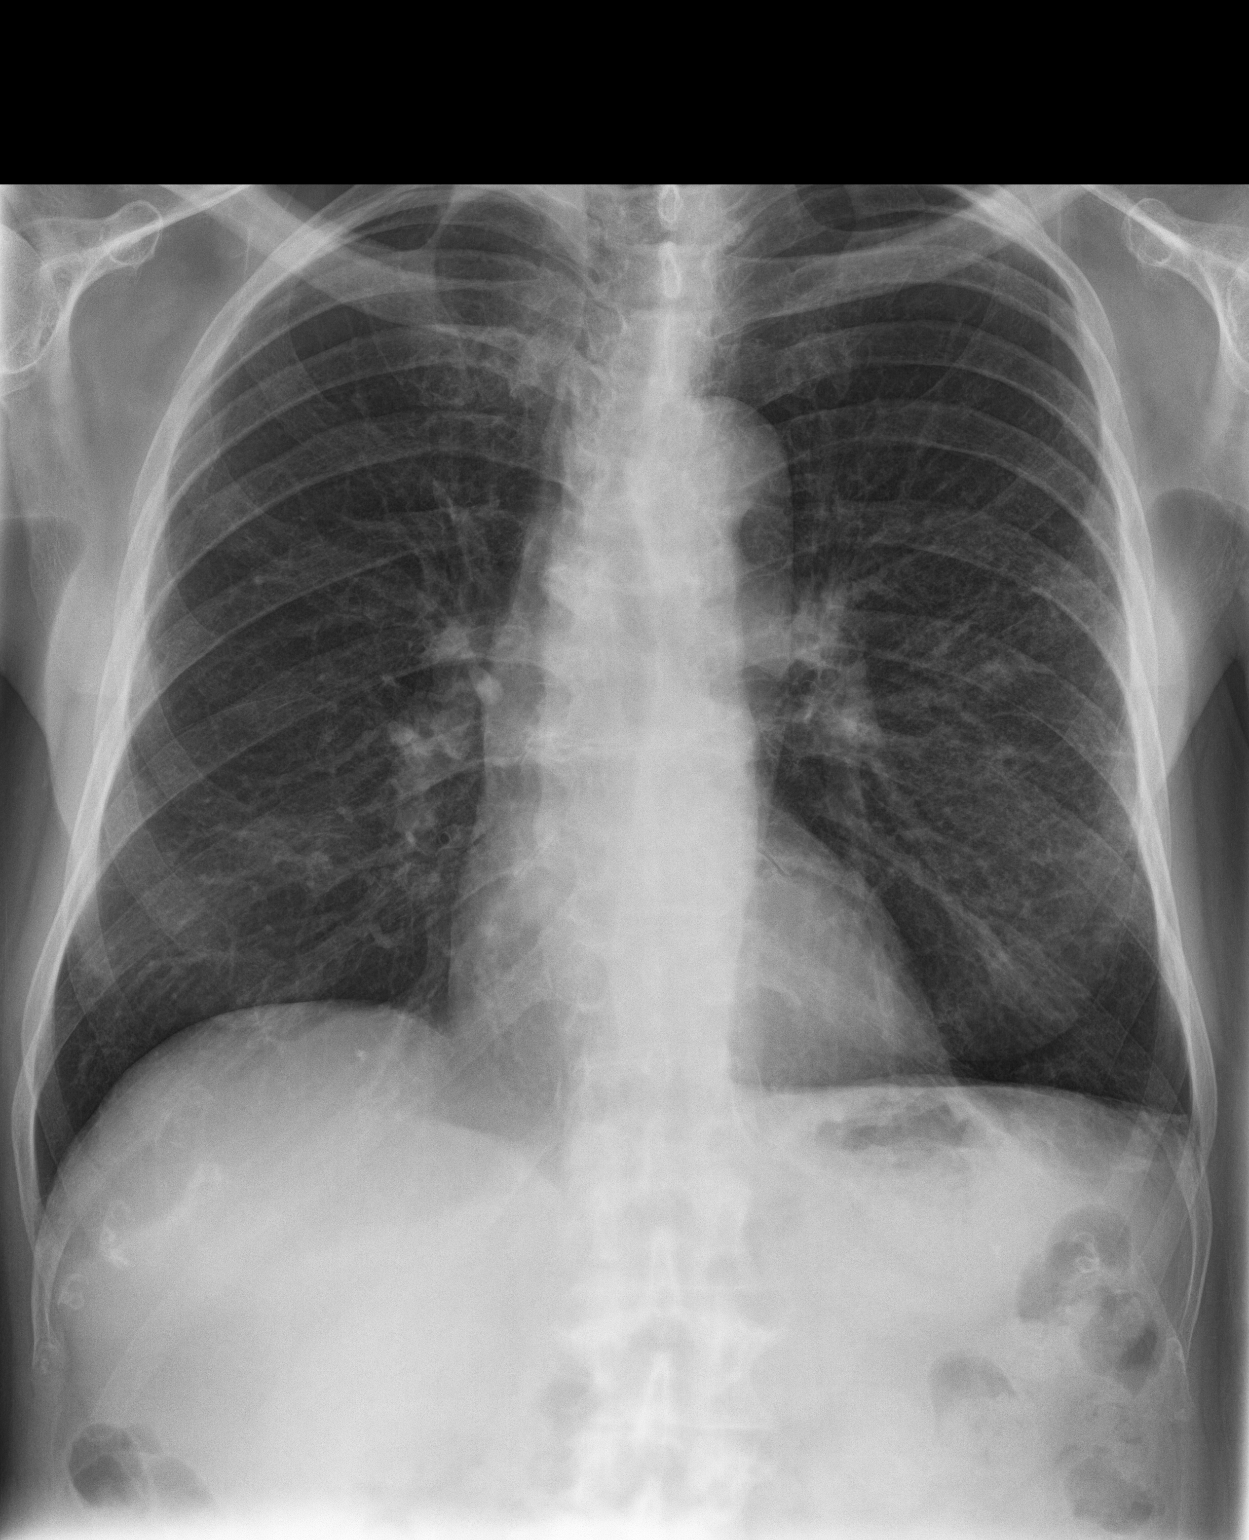

[chest lat]
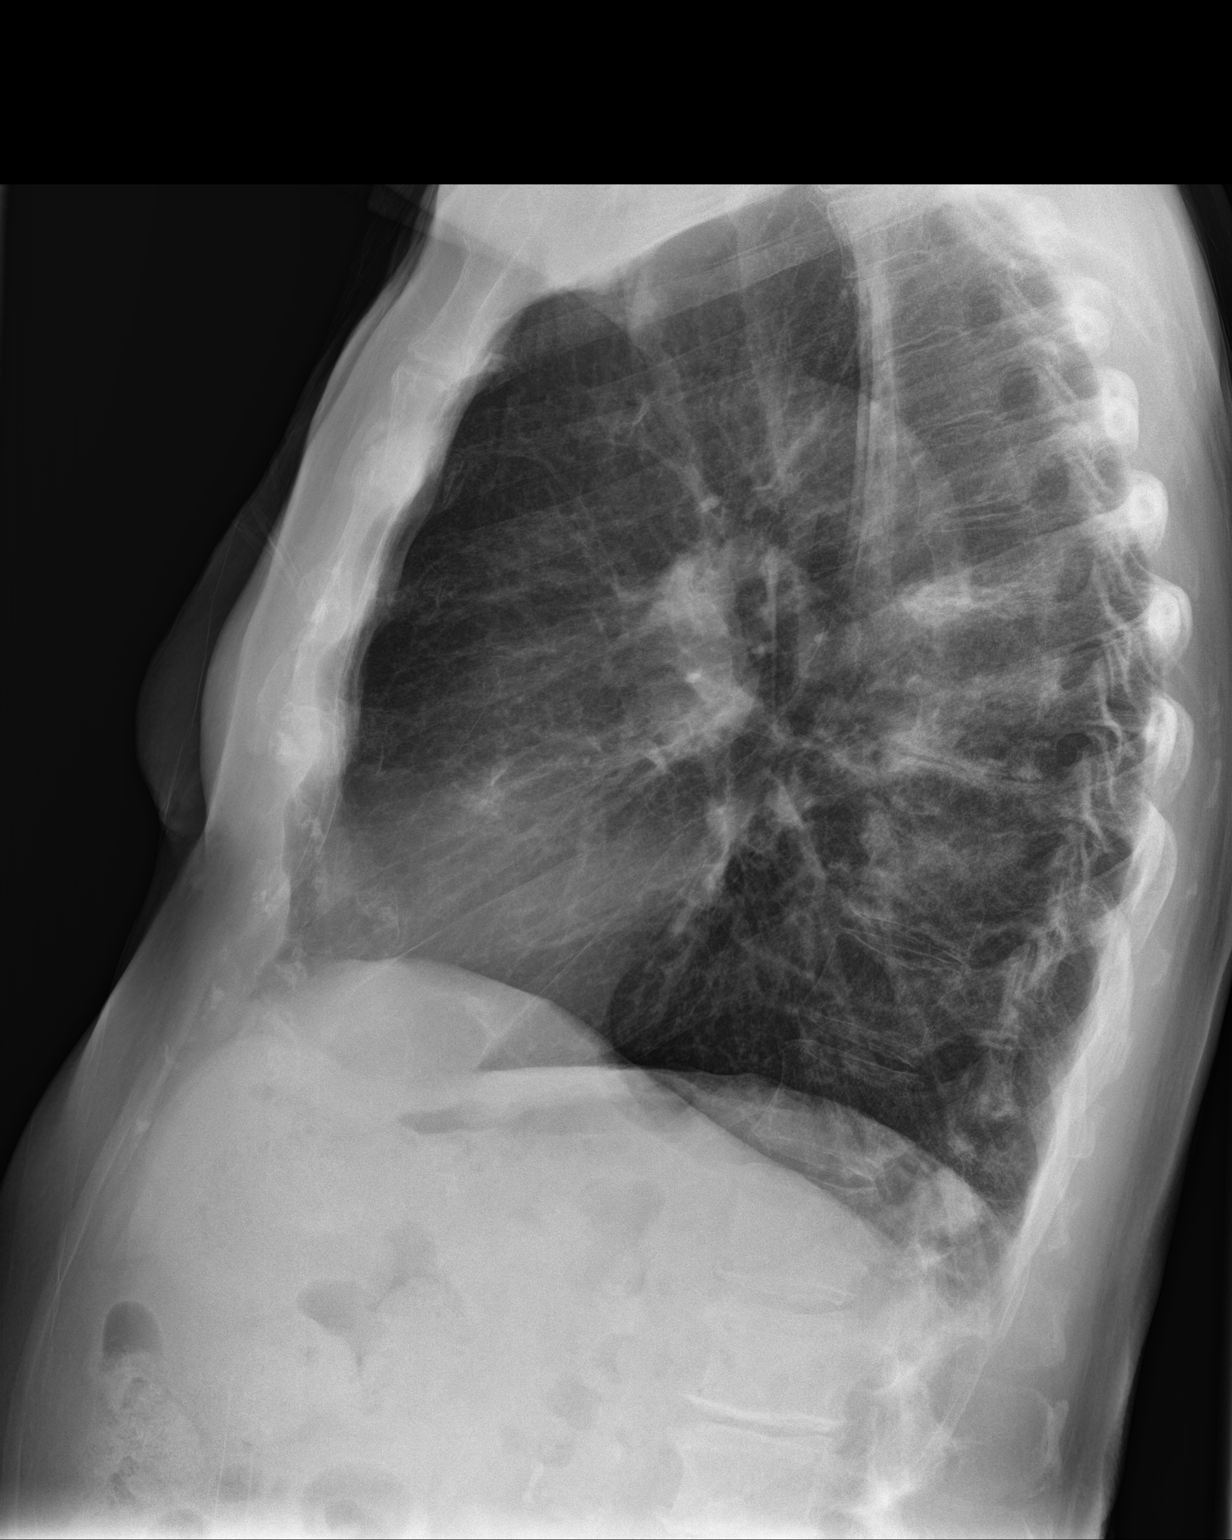

[2 of 2 positions shown; findings below may reference images not displayed]

FINDINGS: The lungs are clear without focal pneumonia, edema, pneumothorax or
pleural effusion. Interstitial markings are diffusely coarsened with
chronic features. The cardiopericardial silhouette is within normal
limits for size. The visualized bony structures of the thorax show
no acute abnormality.
IMPRESSION: Chronic interstitial coarsening. No active cardiopulmonary disease.
